# Patient Record
Sex: Female | Born: 1950 | Race: White | Hispanic: No | Smoking: Former smoker
Health system: Southern US, Community
[De-identification: ages and names within clinical notes are randomized; demographics above are authoritative.]

## PROBLEM LIST (undated history)

## (undated) DIAGNOSIS — E7800 Pure hypercholesterolemia, unspecified: Secondary | ICD-10-CM

## (undated) DIAGNOSIS — I499 Cardiac arrhythmia, unspecified: Secondary | ICD-10-CM

## (undated) DIAGNOSIS — C4491 Basal cell carcinoma of skin, unspecified: Secondary | ICD-10-CM

## (undated) DIAGNOSIS — I10 Essential (primary) hypertension: Secondary | ICD-10-CM

## (undated) DIAGNOSIS — D369 Benign neoplasm, unspecified site: Secondary | ICD-10-CM

## (undated) DIAGNOSIS — G5600 Carpal tunnel syndrome, unspecified upper limb: Secondary | ICD-10-CM

## (undated) DIAGNOSIS — Z9109 Other allergy status, other than to drugs and biological substances: Secondary | ICD-10-CM

## (undated) DIAGNOSIS — E039 Hypothyroidism, unspecified: Secondary | ICD-10-CM

## (undated) DIAGNOSIS — Z8619 Personal history of other infectious and parasitic diseases: Secondary | ICD-10-CM

## (undated) HISTORY — PX: MELANOMA EXCISION: SHX5266

## (undated) HISTORY — DX: Basal cell carcinoma of skin, unspecified: C44.91

## (undated) HISTORY — PX: OTHER SURGICAL HISTORY: SHX169

## (undated) HISTORY — DX: Cardiac arrhythmia, unspecified: I49.9

## (undated) HISTORY — DX: Benign neoplasm, unspecified site: D36.9

## (undated) HISTORY — PX: BASAL CELL CARCINOMA EXCISION: SHX1214

## (undated) HISTORY — PX: BREAST BIOPSY: SHX20

## (undated) HISTORY — DX: Carpal tunnel syndrome, unspecified upper limb: G56.00

## (undated) HISTORY — PX: BREAST CYST ASPIRATION: SHX578

## (undated) HISTORY — DX: Essential (primary) hypertension: I10

## (undated) HISTORY — DX: Personal history of other infectious and parasitic diseases: Z86.19

## (undated) HISTORY — DX: Hypothyroidism, unspecified: E03.9

## (undated) HISTORY — PX: POLYPECTOMY: SHX149

## (undated) HISTORY — DX: Pure hypercholesterolemia, unspecified: E78.00

## (undated) HISTORY — DX: Other allergy status, other than to drugs and biological substances: Z91.09

---

## 1980-08-15 HISTORY — PX: TUBAL LIGATION: SHX77

## 2003-08-16 DIAGNOSIS — D369 Benign neoplasm, unspecified site: Secondary | ICD-10-CM

## 2003-08-16 HISTORY — DX: Benign neoplasm, unspecified site: D36.9

## 2004-03-05 ENCOUNTER — Encounter: Payer: Self-pay | Admitting: Gastroenterology

## 2006-07-17 ENCOUNTER — Ambulatory Visit: Payer: Self-pay | Admitting: Gastroenterology

## 2006-07-28 ENCOUNTER — Ambulatory Visit: Payer: Self-pay | Admitting: Gastroenterology

## 2006-08-14 ENCOUNTER — Ambulatory Visit (HOSPITAL_COMMUNITY): Admission: RE | Admit: 2006-08-14 | Discharge: 2006-08-14 | Payer: Self-pay | Admitting: Gastroenterology

## 2006-08-18 ENCOUNTER — Ambulatory Visit (HOSPITAL_COMMUNITY): Admission: RE | Admit: 2006-08-18 | Discharge: 2006-08-18 | Payer: Self-pay | Admitting: Gastroenterology

## 2006-08-18 ENCOUNTER — Ambulatory Visit: Payer: Self-pay | Admitting: Gastroenterology

## 2006-08-18 LAB — CONVERTED CEMR LAB
AST: 18 units/L (ref 0–37)
CO2: 30 meq/L (ref 19–32)
Calcium: 9.4 mg/dL (ref 8.4–10.5)
Creatinine, Ser: 0.8 mg/dL (ref 0.4–1.2)
Glucose, Bld: 93 mg/dL (ref 70–99)
MCHC: 34.8 g/dL (ref 30.0–36.0)
Platelets: 328 10*3/uL (ref 150–400)
Potassium: 4.6 meq/L (ref 3.5–5.1)
RBC: 4.52 M/uL (ref 3.87–5.11)
Sed Rate: 10 mm/hr (ref 0–25)

## 2006-08-23 ENCOUNTER — Ambulatory Visit: Payer: Self-pay | Admitting: Gastroenterology

## 2006-08-24 ENCOUNTER — Ambulatory Visit: Payer: Self-pay | Admitting: Cardiology

## 2006-09-11 ENCOUNTER — Ambulatory Visit: Payer: Self-pay | Admitting: Gastroenterology

## 2006-10-16 ENCOUNTER — Ambulatory Visit: Payer: Self-pay | Admitting: Internal Medicine

## 2006-10-16 LAB — CONVERTED CEMR LAB
Cholesterol: 208 mg/dL (ref 0–200)
Direct LDL: 118.2 mg/dL
HDL: 68.4 mg/dL (ref >39.0)
TSH: 1 microintl units/mL (ref 0.35–5.50)
VLDL: 15 mg/dL (ref 0–40)

## 2007-01-04 ENCOUNTER — Ambulatory Visit: Payer: Self-pay | Admitting: Internal Medicine

## 2007-01-23 ENCOUNTER — Ambulatory Visit: Payer: Self-pay | Admitting: Internal Medicine

## 2007-10-18 ENCOUNTER — Encounter: Payer: Self-pay | Admitting: Internal Medicine

## 2008-08-18 ENCOUNTER — Ambulatory Visit: Payer: Self-pay | Admitting: General Surgery

## 2008-08-18 ENCOUNTER — Ambulatory Visit: Payer: Self-pay | Admitting: Cardiovascular Disease

## 2010-02-11 ENCOUNTER — Encounter (INDEPENDENT_AMBULATORY_CARE_PROVIDER_SITE_OTHER): Payer: Self-pay | Admitting: *Deleted

## 2010-03-23 ENCOUNTER — Ambulatory Visit: Payer: Self-pay | Admitting: Gastroenterology

## 2010-03-23 DIAGNOSIS — Z86010 Personal history of colonic polyps: Secondary | ICD-10-CM

## 2010-03-23 DIAGNOSIS — R079 Chest pain, unspecified: Secondary | ICD-10-CM | POA: Insufficient documentation

## 2010-03-23 LAB — CONVERTED CEMR LAB
ALT: 30 units/L (ref 0–35)
AST: 21 units/L (ref 0–37)
Albumin: 4.8 g/dL (ref 3.5–5.2)
Alkaline Phosphatase: 69 units/L (ref 39–117)
Basophils Absolute: 0 10*3/uL (ref 0.0–0.1)
Bilirubin, Direct: 0.1 mg/dL (ref 0.0–0.3)
HCT: 38.8 % (ref 36.0–46.0)
Hemoglobin: 13.7 g/dL (ref 12.0–15.0)
Lipase: 28 units/L (ref 11.0–59.0)
Lymphocytes Relative: 24 % (ref 12.0–46.0)
MCHC: 35.2 g/dL (ref 30.0–36.0)
MCV: 88.4 fL (ref 78.0–100.0)
Monocytes Relative: 5.6 % (ref 3.0–12.0)
Neutrophils Relative %: 67.2 % (ref 43.0–77.0)
Platelets: 262 10*3/uL (ref 150.0–400.0)
RBC: 4.39 M/uL (ref 3.87–5.11)
Total Protein: 7 g/dL (ref 6.0–8.3)

## 2010-03-26 ENCOUNTER — Ambulatory Visit: Payer: Self-pay | Admitting: Cardiovascular Disease

## 2010-04-05 ENCOUNTER — Ambulatory Visit (HOSPITAL_COMMUNITY): Admission: RE | Admit: 2010-04-05 | Discharge: 2010-04-05 | Payer: Self-pay | Admitting: Gastroenterology

## 2010-04-12 ENCOUNTER — Telehealth: Payer: Self-pay | Admitting: Gastroenterology

## 2010-09-05 ENCOUNTER — Encounter: Payer: Self-pay | Admitting: Gastroenterology

## 2010-09-14 NOTE — Assessment & Plan Note (Signed)
Summary: acid reflux//discuss colon--ch.   History of Present Illness Visit Type: Initial Visit Primary GI MD: Melvia Heaps MD [facility] Primary Provider: Oswaldo Done, MD Chief Complaint: acid reflux/discuss colonoscopy History of Present Illness:   [name] [name] is a pleasant 60 year old white female referred at the request of Dr. Katrinka Blazing for evaluation of chest discomfort.  She complains of burning chest pain that radiates to her back and occasionally to her neck, shoulders and forearms.  Pain may occur with mild activity but is not necessarily related to eating.  At times it has been moderately severe.  She has taken antacids with partial relief.  She rarely has dysphagia to solids.  She may have several episodes a month.  Endoscopy in 2007 for dyspepsia demonstrated esophagitis.  The patient has a history of adenomatous polyps.   GI Review of Systems    Reports acid reflux, belching, chest pain, and  nausea.      Denies abdominal pain, bloating, dysphagia with liquids, dysphagia with solids, heartburn, loss of appetite, vomiting, vomiting blood, weight loss, and  weight gain.        Denies anal fissure, black tarry stools, change in bowel habit, constipation, diarrhea, diverticulosis, fecal incontinence, heme positive stool, hemorrhoids, irritable bowel syndrome, jaundice, light color stool, liver problems, rectal bleeding, and  rectal pain. Preventive Screening-Counseling & Management  Alcohol-Tobacco     Smoking Status: quit      Drug Use:  no.      Current Medications (verified): 1)  Avapro 300 Mg Tabs (Irbesartan) .... Take 12  Tablet By Mouth Once A Day 2)  Synthroid 100 Mcg Tabs (Levothyroxine Sodium) .... Take 1 Tablet By Mouth Once A Day 3)  Lunesta 3 Mg  Tabs (Eszopiclone) .... 1/2 Tablet By Mouth At Bedtime As Needed 4)  Aspirin Low Dose 81 Mg  Tabs (Aspirin) .... Take 1 Tablet By Mouth Once A Day 5)  Progesteron 100 Mg (Compounded) .... Take 25 Days Out of A Month 6)   Os-Cal 500 + D 500-500 Mg-Unit Tabs (Calcium Carbonate-Vitamin D) .Marland Kitchen.. 1 By Mouth Once Daily 7)  Multivitamins  Tabs (Multiple Vitamin) .Marland Kitchen.. 1 By Mouth Once Daily 8)  Cal/mag Citrate 250-125 Mg Tabs (Calcium-Magnesium) .Marland Kitchen.. 1 By Mouth Once Daily 9)  Red Yeast Rice Extract 600 Mg Caps (Red Yeast Rice Extract) .Marland Kitchen.. 1 By Mouth Once Daily 10)  Omega 3 340 Mg Cpdr (Omega-3 Fatty Acids) .Marland Kitchen.. 1 By Mouth Once Daily  Allergies (verified): 1)  ! Codeine  Past History:  Past Medical History: Reviewed history from 03/18/2010 and no changes required. Adenomatous polyp 2005 Hypertension Hyperthyroidism  Past Surgical History: Tubal Ligation CTS Release Rt. Wrist Lipoma removal  Rt. Shoulder  Family History: Reviewed history and no changes required. Family History of Uterine Cancer:mother Family History of Diabetes: MGM Family History of Heart Disease: father No FH of Colon Cancer:  Social History: Reviewed history and no changes required. 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  Alcohol Use - yes  1 per day Daily Caffeine Use   2-3 per day Illicit Drug Use - no Smoking Status:  quit Drug Use:  no  Review of Systems  The patient denies allergy/sinus, anemia, anxiety-new, arthritis/joint pain, back pain, blood in urine, breast changes/lumps, change in vision, confusion, cough, coughing up blood, depression-new, fainting, fatigue, fever, headaches-new, hearing problems, heart murmur, heart rhythm changes, itching, menstrual pain, muscle pains/cramps, night sweats, nosebleeds, pregnancy symptoms, shortness of breath, skin rash, sleeping problems, sore  throat, swelling of feet/legs, swollen lymph glands, thirst - excessive , urination - excessive , urination changes/pain, urine leakage, vision changes, and voice change.    Vital Signs:  Patient profile:   60 year old female Height:      61.5 inches Weight:      151.38 pounds BMI:     28.24 Pulse rate:   68 /  minute Pulse rhythm:   regular BP sitting:   130 / 82  (left arm)  Vitals Entered By: Milford Cage NCMA (March 23, 2010 4:16 PM)  Physical Exam  Additional Exam:  On physical exam she is a well-developed well-nourished female  Physical Exam: General:   WDWN HEENT:   anicteric.  No pharyngeal abnormalities Neck:   No masses, thyroidmegaly Nodes:   No cervical, axillary, inguinal adenopathy Chest:    Clear to auscultation Cardiac:   No murmurs, gallops, rubs Abdomen:   BS active.  No abd masses, organomegaly; there is mild tenderness to palpation in the right upper quadrant Rectal:   Deferred Extremities:   No cyanosis, clubbing, edema Skeletal:   No deformities Neuro:   Alert, oriented x3.  No focal abnormalities     Impression & Recommendations:  Problem # 1:  CHEST PAIN UNSPECIFIED (ICD-786.50) Chest pain certainly could be due to reflux esophagitis and/or esophageal spasm.  There are elements of her chest pain that raised the question of angina and, less likely, cholecystitis.  Recommendations #1 trial of DEXILANT 60 mg b.i.d. #2 abdominal ultrasound #3 refer for cardiac evaluation to rule out cardiac ischemia as a cause for chest pain Orders: TLB-CBC Platelet - w/Differential (85025-CBCD) TLB-Amylase (82150-AMYL) TLB-Lipase (83690-LIPASE) TLB-Hepatic/Liver Function Pnl (80076-HEPATIC) Cardiology Referral (Cardiology) Ultrasound Abdomen (UAS)  Problem # 2:  PERSONAL HISTORY OF COLONIC POLYPS (ICD-V12.72) Last colonoscopy 2005.  Plan followup colonoscopy.  Risks, alternatives, and complications of the procedure, including bleeding, perforation, and possible need for surgery, were explained to the patient.  Patient's questions were answered.  Patient Instructions: 1)  Copy sent to : Oswaldo Done, MD 2)  You will go to the basement for labs today 3)  Your cardiology appointment is in [place] on 03/26/2010 at 2:45 pm 4)  [address] [place]  phone # [phone] 5)  We are also scheduling you an abdominal Ultrasound 6)  Colonoscopy and Flexible Sigmoidoscopy brochure given.  7)  Conscious Sedation brochure given.  8)  The medication list was reviewed and reconciled.  All changed / newly prescribed medications were explained.  A complete medication list was provided to the patient / caregiver.

## 2010-09-14 NOTE — Procedures (Signed)
Summary: EGD   EGD  Procedure date:  07/28/2006  Findings:      Findings: Esophagitis  Location: [facility]   Patient Name: [name], [name] MRN:  Procedure Procedures: Panendoscopy (EGD) CPT: 43235.    with esophageal dilation. CPT: G9296129.  Personnel: Endoscopist: Barbette Hair. Arlyce Dice, MD.  Indications Symptoms: Dysphagia. Dyspepsia,  History  Current Medications: Patient is not currently taking Coumadin.  Comments: Patient history reviewed and updated, pre-procedure physical performed prior to initiation of sedation? Pre-Exam Physical: Performed Jul 28, 2006  Cardio-pulmonary exam, HEENT exam, Abdominal exam, Mental status exam WNL.  Comments: Patient history reviewed and updated, pre-procedure physical performed prior to initiation of sedation?yes Exam Exam Info: Maximum depth of insertion Duodenum, intended Duodenum. Vocal cords visualized. Gastric retroflexion performed. ASA Classification: I. Tolerance: excellent.  Sedation Meds: Robinul 0.2 given IV. Fentanyl 50 mcg. given IV. Versed 6 mg. given IV. Cetacaine Spray 2 sprays given aerosolized. Mylicon 1 cc  Monitoring: BP and pulse monitoring done. Oximetry used. Supplemental O2 given at 2 Liters.  Findings - Normal: Fundus to Duodenal 2nd Portion.  - Dilation: Fundus. for dysphagia without stricture. Maloney dilator used, Diameter: 17 mm, Minimal Resistance, No Heme present on extraction. Outcome: successful.  - ESOPHAGEAL INFLAMMATION: Proximal margin 39 cm from mouth,  distal margin 40 cm. Length of inflammation: 1 cm. Edema present. [place] Classification: Grade A. ICD9: Esophagitis: 530.10.   Assessment Abnormal examination, see findings above.  Diagnoses: 530.10: Esophagitis.   Events  Unplanned Intervention: No unplanned interventions were required.  Unplanned Events: There were no complications. Plans Medication(s): Continue current medications.  Disposition: After  procedure patient sent to recovery. After recovery patient sent home.  Scheduling: Office Visit, to [employer]. Arlyce Dice, MD, around Aug 28, 2006.    This report was created from the original endoscopy report, which was reviewed and signed by the above listed endoscopist.

## 2010-09-14 NOTE — Assessment & Plan Note (Signed)
Summary: np6/ chestpain / pt has bcbs.gd   Visit Type:  Initial Consult Primary Couper [place]aj:  Oswaldo Done, MD  CC:  c/o chest and abdominal pain.Marland Kitchen  History of Present Illness: [name] [name] is a very pleasant 60 year old woman who is a patient of Dr. Katrinka Blazing with a history of GERD, hypertension, recent long history of chest discomfort over the past several months. She presents for evaluation of her chest pain.  She states that she has chest discomfort that she would describe as a burning, that sometimes radiates to her neck and shoulders. It has been going on for months, lasting most of the day. She is able to exercise with no exacerbation of her discomfort. It is constant and bothersome. She reports having 2 to the emergency room in the past, I have given her a nitroglycerin there was some improvement of her pain. She has not tried any other cardiac medications. she has been on Dexilant for the past several days.uncertain if it is related to eating, with either improvement or exacerbation of her symptoms.  In the beginning of the year, she had significant headaches but he seemed to have improved. She has had an EGD several years ago for dyspepsia that demonstrated esophagitis.  EKG shows normal sinus rhythm with rate 66 beats per minute, no significant ST or T wave changes.  Current Medications (verified): 1)  Avapro 300 Mg Tabs (Irbesartan) .... Take 12  Tablet By Mouth Once A Day 2)  Synthroid 100 Mcg Tabs (Levothyroxine Sodium) .... Take 1 Tablet By Mouth Once A Day 3)  Lunesta 3 Mg  Tabs (Eszopiclone) .... 1/2 Tablet By Mouth At Bedtime As Needed 4)  Aspirin Low Dose 81 Mg  Tabs (Aspirin) .... Take 1 Tablet By Mouth Once A Day 5)  Progesteron 100 Mg (Compounded) .... Take 25 Days Out of A Month 6)  Os-Cal 500 + D 500-500 Mg-Unit Tabs (Calcium Carbonate-Vitamin D) .Marland Kitchen.. 1 By Mouth Once Daily 7)  Multivitamins  Tabs (Multiple Vitamin) .Marland Kitchen.. 1 By Mouth Once Daily 8)  Cal/mag Citrate 250-125 Mg  Tabs (Calcium-Magnesium) .Marland Kitchen.. 1 By Mouth Once Daily 9)  Red Yeast Rice Extract 600 Mg Caps (Red Yeast Rice Extract) .Marland Kitchen.. 1 By Mouth Once Daily 10)  Omega 3 340 Mg Cpdr (Omega-3 Fatty Acids) .Marland Kitchen.. 1 By Mouth Once Daily  Allergies (verified): 1)  ! Codeine  Past History:  Past Medical History: Last updated: 03/18/2010 Adenomatous polyp 2005 Hypertension Hyperthyroidism  Past Surgical History: Last updated: 03/23/2010 Tubal Ligation CTS Release Rt. Wrist Lipoma removal  Rt. 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  Alcohol Use - yes  1 per day Daily Caffeine Use   2-3 per day Illicit Drug Use - no  Risk Factors: Smoking Status: quit (03/23/2010)  Review of Systems       The patient complains of chest pain.  The patient denies fever, weight loss, weight gain, vision loss, decreased hearing, hoarseness, syncope, dyspnea on exertion, peripheral edema, prolonged cough, abdominal pain, incontinence, muscle weakness, depression, and enlarged lymph nodes.    Vital Signs:  Patient profile:   60 year old female Height:      61.5 inches Weight:      152 pounds BMI:     28.36 Pulse rate:   69 /  minute BP sitting:   144 / 83  (left arm) Cuff size:   regular  Vitals Entered By: Bishop Dublin, CMA (March 26, 2010 2:46 PM)  Physical Exam  General:  Well developed, well nourished, in no acute distress. Head:  normocephalic and atraumatic Neck:  Neck supple, no JVD. No masses, thyromegaly or abnormal cervical nodes. Lungs:  Clear bilaterally to auscultation and percussion. Heart:  Non-displaced PMI, chest non-tender; regular rate and rhythm, S1, S2 without murmurs, rubs or gallops. Carotid upstroke normal, no bruit. Pedals normal  pulses. No edema, no varicosities. Abdomen:  Bowel sounds positive; abdomen soft and non-tender without masses Msk:  Back normal, normal gait. Muscle strength and tone normal. Pulses:  pulses normal in all 4 extremities Extremities:  No clubbing or cyanosis. Neurologic:  Alert and oriented x 3. Skin:  Intact without lesions or rashes. Psych:  Normal affect.   Impression & Recommendations:  Problem # 1:  CHEST PAIN UNSPECIFIED (ICD-786.50) symptoms are concerning for esophageal spasm. Unable to definitively rule out other etiologies such as hiatal hernia, coronary spasm, dyspepsia.  We have given her a prescription for amlodipine and will try calcium channel blockers for her suspected spasm. She will try amlodipine at 5 mg, possibly titrating to 10 mg if her blood pressure tolerates.  Alternatively, she could try isosorbide mononitrate starting at 15 mg titrating to 30 mg if her blood pressure tolerates. For severe episodes of pain, she could try sublingual nitroglycerin. We will hold off on any cardiac testing as she has no symptoms with exertion, her symptoms are somewhat atypical as it lasts throughout the entire day and are mild.  Her updated medication list for this problem includes:    Amlodipine Besylate 10 Mg Tabs (Amlodipine besylate) .Marland Kitchen... Take one tablet by mouth daily    Aspirin Low Dose 81 Mg Tabs (Aspirin) .Marland Kitchen... Take 1 tablet by mouth once a day  Patient Instructions: 1)  Your physician recommends that you schedule a follow-up appointment in: as needed  2)  Your physician has recommended you make the following change in your medication: STOP avapro START amolodipine Prescriptions: AMLODIPINE BESYLATE 10 MG TABS (AMLODIPINE BESYLATE) Take one tablet by mouth daily  #30 x 6   Entered by:   Benedict Needy, RN   Authorized by:   Dossie Arbour MD   Signed by:   Benedict Needy, RN on 03/26/2010   Method used:   Electronically to        [facility]* (retail)       [address]       [place], [place]  [address]       Ph: [phone]       Fax: [phone]   RxID:   [id number]

## 2010-09-14 NOTE — Letter (Signed)
Summary: New Patient letter  [facility]  [address] [place], [place] [address]   Phone: [phone]  Fax: [phone]       02/11/2010 MRN: [id number]  [name] [name] [address] [place], [place]  [address]  Dear [name] [name],  Welcome to the Gastroenterology Division at [facility].    You are scheduled to see Dr.  Arlyce Dice on 03-23-10 at 3:45p.m. on the 3rd floor at [facility], [name]  We ask that you try to arrive at our office 15 minutes prior to your appointment time to allow for check-in.  We would like you to complete the enclosed self-administered evaluation form prior to your visit and bring it with you on the day of your appointment.  We will review it with you.  Also, please bring a complete list of all your medications or, if you prefer, bring the medication bottles and we will list them.  Please bring your insurance card so that we may make a copy of it.  If your insurance requires a referral to see a specialist, please bring your referral form from your primary care physician.  Co-payments are due at the time of your visit and may be paid by cash, check or credit card.     Your office visit will consist of a consult with your physician (includes a physical exam), any laboratory testing he/she may order, scheduling of any necessary diagnostic testing (e.g. x-ray, ultrasound, CT-scan), and scheduling of a procedure (e.g. Endoscopy, Colonoscopy) if required.  Please allow enough time on your schedule to allow for any/all of these possibilities.    If you cannot keep your appointment, please call [phone] to cancel or reschedule prior to your appointment date.  This allows [place] the opportunity to schedule an appointment for another patient in need of care.  If you do not cancel or reschedule by 5 p.m. the business day prior to your appointment date, you will be charged a $50.00 late cancellation/no-show fee.    Thank you for choosing [facility] for your medical needs.  We appreciate the opportunity to care for you.  Please visit [place] at our website  to learn more about our practice.                     Sincerely,                                                             The Gastroenterology Division

## 2010-09-14 NOTE — Progress Notes (Signed)
Summary: Ultrasound Results  Phone Note Call from Patient Call back at [phone] or cell [phone]   Caller: Patient Call For: Dr Arlyce Dice Reason for Call: Talk to Nurse Summary of Call: Patient would like u/s results Initial call taken by: Tawni Levy,  April 12, 2010 11:25 AM  Follow-up for Phone Call        Pt. advised I will call her with Ultrasound results after Dr.Kaplan returns, reviews and advises. Pt. will continue the Dexilant until further instructions. She does feel her symptoms have improved on the Dexilant.  Follow-up by: Laureen Ochs LPN,  April 12, 2010 12:51 PM  Additional Follow-up for Phone Call Additional follow up Details #1::        Ultra negative for significant findings. continue dexilant Additional Follow-up by: Louis Meckel MD,  April 20, 2010 1:23 PM    Additional Follow-up for Phone Call Additional follow up Details #2::    Pt. ntfd. of [place] results and will continue Dexilant. Follow-up by: Teryl Lucy RN,  April 20, 2010 4:39 PM

## 2010-09-14 NOTE — Letter (Signed)
Summary: PHI  PHI   Imported By: Harlon Flor 03/29/2010 10:43:51  _____________________________________________________________________  External Attachment:    Type:   Image     Comment:   External Document

## 2010-12-31 NOTE — Assessment & Plan Note (Signed)
[facility]                           PRIMARY CARE OFFICE NOTE   ASYRIA, KOLANDER                        MRN:          [id number]  DATE:10/16/2006                            DOB:          1951/02/15    CHIEF COMPLAINT:  New patient to practice.   HISTORY OF PRESENT ILLNESS:  The patient is a 60 year old here to  establish primary care. She has not had a primary care physician in the  past and was followed by her OB/GYN Dr.  Barnabas Lister. She has seen Dr.  Arlyce Dice of [facility] regarding early satiety, underwent workup which  included a CAT scan which was unremarkable, EGD which showed  esophagitis. The patient was tried on a proton pump inhibitor which did  not significant change her symptomatology and also tried Reglan, which  actually caused constipation. She also had a gastric emptying study  which was unremarkable. The patient did have some complaints of issues  with weight loss. However, her weight has been stable, actually gained 2  pounds since her last visit with Dr.  Arlyce Dice on September 11, 2006.   Her other chronic medical issues include hypertension and  hypothyroidism. There is also a report of elevated cholesterol readings.  This was uncovered during the time hypothyroidism was discovered.  However, followup lipids have not been performed since her thyroid has  been regulated.   She denies any history of coronary artery disease, has never had a  stroke, no history of peripheral vascular disease.   PAST MEDICAL HISTORY SUMMARY:  1. Hypertension.  2. Hypothyroidism.  3. Status post carpal tunnel surgery on her right hand.  4. History of basal cell carcinoma removed from her back.  5. Chronic insomnia.  6. History of migraine headache.   CURRENT MEDICATIONS:  1. Avapro 150 mg once a day.  2. Synthroid 100 mcg once a day.  3. Multivitamin 1 a day.  4. Coenzyme Q 1 a day.  5. Aspirin 81 mg once a day.  6. Lunesta 3 mg 1/2 q.h.s.  p.r.n.   ALLERGIES TO MEDICATIONS:  None known.   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   FAMILY HISTORY:  Mother deceased at age 45 secondary to cerebral  hemorrhage. Father died at age 62 secondary to metastatic lung cancer.  He was a long time smoker. She has 2 brothers, one deceased secondary to  a drowning accident.   HABITS:  Red wine with dinner, averages 4 to 5 drinks per week. Remote  history of tobacco abuse, quit in 1981. She was a 1 pack per week smoker  times 6 to 7 years.   PREVENTATIVE CARE HISTORY:  Her last Pap was in June 2007. Her last  colonoscopy was in 2005.   REVIEW OF SYSTEMS:  Denies any HEENT symptoms and no history of chest  pain with exertion or dyspnea on exertion. GI symptoms as noted above.  No dysuria, frequency, urgency, and all other systems negative.  PHYSICAL EXAMINATION:  VITALS:  Height is 63 inches, weight is 149  pounds, temperature 97.2, pulse is 60, blood pressure is 131/76 in the  left arm in a seated position.  IN GENERAL:  The patient is a pleasant well-developed, well-nourished 30-  year-old white female in no apparent distress.  HEENT:  Normocephalic, atraumatic. Pupils were equal and reactive to  light bilaterally. Extraocular muscles were intact. The patient was  anicteric. Conjunctivae was within normal limits.  EXTERNAL AUDITORY CANALS:  Reveals cerumen impaction on the right side.  The left side was clear.  NECK:  Supple. No adenopathy, carotid bruit, or thyromegaly.  CHEST EXAM:  Normal respiratory effort. Chest was clear to auscultation  bilaterally. No rales, rhonchi, or wheezing.  CARDIOVASCULAR:  Regular rate and rhythm,  no significant murmurs, rubs,  or gallops appreciated.  ABDOMEN:  Soft, nontender, positive bowel sounds. No organomegaly.  MUSCULOSKELETAL EXAM:  No clubbing, cyanosis, or edema. The  patient has  bounding pedis and dorsalis pulses.  SKIN:  Warm and dry.  NEUROLOGIC:  Cranial nerves II through XII were grossly intact. She was  nonfocal.   IMPRESSION/RECOMMENDATIONS:  1. Early satiety, unclear etiology.  2. Hypertension, stable.  3. Hypothyroidism.  4. History of migraine headache.  5. History of basal cell skin cancer.  6. Health maintenance.   RECOMMENDATIONS:  Encouraged the patient to maintain a blood pressure  log at home. She did have a reading of 150/70 with a manual cuff for me  today. This may be related to white coat hypertension.   She does have a history of elevated cholesterol readings and lipid panel  will be performed today as well as followup TSH.   In terms of her mild nausea, this may be secondary to [facility]. On  followup visit we will discuss a trial of Ambien to see if it changes  her GI symptomatology.   Follow up time is approximately 4 weeks.     Barbette Hair. Artist Pais, DO  Electronically Signed    RDY/MedQ  DD: 10/16/2006  DT: 10/16/2006  Job #: [id number]

## 2010-12-31 NOTE — Assessment & Plan Note (Signed)
[facility]                         GASTROENTEROLOGY OFFICE NOTE   NAME:[name] [name], [name] [name]                        MRN:          [id number]  DATE:08/23/2006                            DOB:          Feb 03, 1951    PROBLEM:  Early satiety and weight-loss.   HPI:  [name] [name] has returned for scheduled GI followup.  She  continues to complain of postprandial fullness with early satiety.  She  has lost seven pounds since her last visit.  Should she eat too much,  she has mild nausea.  She is without pain.  Abdominal ultrasound and  gastric emptying scan were normal.  Upper endoscopy on July 28, 2006, demonstrated a very mild grade 1 esophagitis.  Symptoms have not  improved with Zegerid.   LAB WORK:  Including CBC and CMET, was normal.   ON EXAM:  Pulse 78, blood pressure 122/74, weight 151.  Abdomen is  without masses, tenderness or organomegaly.  There is no succussion  splash.   IMPRESSION:  Persistent early satiety with weight-loss.  Etiology is not  apparent.  She has had no changes in her medications.  There is nothing  on examination.  There are no abnormalities on her abdominal exam and  she does not appear to have gastroparesis.   RECOMMENDATIONS:  1. Trial of Reglan 10 mg one half hour a.c. and h.s.  2. CT of the abdomen and pelvis to rule out other intra-abdominal      pathology.     Barbette Hair. Arlyce Dice, MD,FACG  Electronically Signed    RDK/MedQ  DD: 08/23/2006  DT: 08/23/2006  Job #: [id number]   cc:   Annett Fabian

## 2010-12-31 NOTE — Assessment & Plan Note (Signed)
[facility]                         GASTROENTEROLOGY OFFICE NOTE   NAME:[name], [name]                        MRN:          [id number]  DATE:09/11/2006                            DOB:          [birth year]    PROBLEM:  Early satiety and weight loss.  [name] [name] has returned for  a scheduled GI followup.  CT of the abdomen and pelvis was entirely  normal.  Reglan did not help her symptoms.  She does note slight  improvement, however, characterized by less satiety.  She is eating  healthier foods and having more frequent smaller meals, and overall is  feeling well.  GI symptoms are much less prominent.  Weight, she has  dropped another 2-and-a-half pounds since her visit from January 9.   EXAM:  Pulse 78, blood pressure 121/64, weight 147.   IMPRESSION:  Early satiety and weight loss, etiology for this is  unclear, though her symptoms seem to be subsiding.  She looks and feels  well at this time.   RECOMMENDATIONS:  Observation.  I have asked [name] to return in  about 6 to 8 weeks unless she should develop more specific symptoms.     Barbette Hair. Arlyce Dice, MD,FACG  Electronically Signed    RDK/MedQ  DD: 09/11/2006  DT: 09/11/2006  Job #: [id number]   cc:   Annett Fabian

## 2010-12-31 NOTE — Letter (Signed)
July 17, 2006    [name] Fabian, MD  [address]  [place], [place] [address]   RE:  [name], [name]  MRN:  [id number]  /  DOB:  Jan 27, 1951   Dear Dr. Jonny Ruiz:   Upon your kind referral, I had the pleasure of evaluating your patient  and I am pleased to offer my findings.  I saw [name] [name] in the office  today.  Enclosed is a copy of my progress note that details my findings  and recommendations.   Thank you for the opportunity to participate in your patient's care.    Sincerely,      Barbette Hair. Arlyce Dice, MD,FACG  Electronically Signed    RDK/MedQ  DD: 07/17/2006  DT: 07/17/2006  Job #: [id number]

## 2010-12-31 NOTE — Assessment & Plan Note (Signed)
[facility]                         GASTROENTEROLOGY OFFICE NOTE   NAME:[name], [name]                          MRN:          [id number]  DATE:07/17/2006                            DOB:          Mar 05, 1951    REFERRING PHYSICIAN:  Annett Fabian, MD   PROBLEM:  Digestion problem.   [name] [name] is a pleasant 60 year old white female referred through the  courtesy of Dr. Barnabas Lister for evaluation. For the past couple of  months, she has been complaining of bloating, post prandial fullness and  early satiety. She denies pyrosis, per say. She has had no nausea. She  does have occasional dysphagia to solids and, more rarely, liquids. She  is on no regular gastric irritants including nonsteroidals. Weight has  been stable. She was given a medicine whose name she does not recall  when her symptoms started that ameliorated her symptoms, but they have  not returned. There has been no change in bowel habits. She has a  history of adenomatous colon polyps removed in 2005.   PAST MEDICAL HISTORY:  Pertinent for hypertension and thyroid disease.  She has arthritis and suffers from chronic headaches. She is status post  tubal ligation and carpal tunnel repair.   FAMILY HISTORY:  Pertinent for mother with uterine cancer.   MEDICATIONS:  Avapro, Synthroid and baby aspirin. She has no allergies.   She does not smoke. She has 3-4 glasses of wine a week. 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   REVIEW OF SYSTEMS:  Positive for joint pain.   PHYSICAL EXAMINATION:  VITAL SIGNS:  Pulse 72, blood pressure 120/80,  weight 157.  HEENT:  EOMI. PERRLA. Sclerae are anicteric.  Conjunctivae are pink.  NECK:  Supple without thyromegaly, adenopathy or carotid bruits.  CHEST:  Clear to auscultation and percussion without adventitious  sounds.  CARDIAC:  Regular rhythm; normal S1 S2.  There are no murmurs, gallops  or rubs.  ABDOMEN:  Bowel sounds  are normoactive.  Abdomen is soft, non-tender and  non-distended.  There are no abdominal masses, tenderness, splenic  enlargement or hepatomegaly.  EXTREMITIES:  Full range of motion.  No cyanosis, clubbing or edema.  RECTAL:  Deferred.   IMPRESSION:  1. Probably gastroesophageal reflux disease. Dysphagia raises the      question of an esophageal stricture.  2. History of colon polyps.   RECOMMENDATION:  1. Empiric trial of Protonix 40 mg daily.  2. Upper endoscopy with dilatation as indicated.  3. Followup colonoscopy in 2010.     Barbette Hair. Arlyce Dice, MD,FACG  Electronically Signed    RDK/MedQ  DD: 07/17/2006  DT: 07/17/2006  Job #: [id number]   cc:   Annett Fabian, MD

## 2010-12-31 NOTE — Letter (Signed)
July 17, 2006    [name] [name]  [address] [place] [address]   RE:  SAHASRA, BELUE  MRN:  [id number]  /  DOB:  06-10-1951   Dear [name] [name]:   It is my pleasure to have treated you recently as a new patient in my  office.  I appreciate your confidence and the opportunity to participate  in your care.   Since I do have a busy inpatient endoscopy schedule and office schedule,  my office hours vary weekly.  I am, however, available for emergency  calls every day through my office.  If I cannot promptly meet an urgent  office appointment, another one of our gastroenterologists will be able  to assist you.   My well-trained staff are prepared to help you at all times.  For  emergencies after office hours, a physician from our gastroenterology  section is always available through my 24-hour answering service.   While you are under my care, I encourage discussion of your questions  and concerns, and I will be happy to return your calls as soon as I am  available.   Once again, I welcome you as a new patient and I look forward to a happy  and healthy relationship.    Sincerely,      Barbette Hair. Arlyce Dice, MD,FACG  Electronically Signed    RDK/MedQ  DD: 07/17/2006  DT: 07/17/2006  Job #: [id number]

## 2011-03-03 ENCOUNTER — Encounter: Payer: Self-pay | Admitting: Cardiovascular Disease

## 2011-05-20 LAB — HM PAP SMEAR: HM PAP: NEGATIVE

## 2012-02-28 ENCOUNTER — Encounter: Payer: Self-pay | Admitting: Gastroenterology

## 2012-04-13 ENCOUNTER — Ambulatory Visit: Payer: Self-pay | Admitting: Gastroenterology

## 2012-06-08 ENCOUNTER — Encounter: Payer: Self-pay | Admitting: Gastroenterology

## 2012-06-08 ENCOUNTER — Ambulatory Visit (INDEPENDENT_AMBULATORY_CARE_PROVIDER_SITE_OTHER): Payer: BC Managed Care – PPO | Admitting: Gastroenterology

## 2012-06-08 VITALS — BP 138/80 | HR 72 | Ht 61.5 in | Wt 150.0 lb

## 2012-06-08 DIAGNOSIS — Z86010 Personal history of colonic polyps: Secondary | ICD-10-CM

## 2012-06-08 MED ORDER — NA SULFATE-K SULFATE-MG SULF 17.5-3.13-1.6 GM/177ML PO SOLN: 1.0000 | Freq: Once | ORAL | Status: DC

## 2012-06-08 NOTE — Assessment & Plan Note (Signed)
Plan followup colonoscopy 

## 2012-06-08 NOTE — Progress Notes (Signed)
History of Present Illness: Pleasant 61 year old white female with history of adenomatous colon polyps here to set up colonoscopy.  An adenomatous polyp was removed approximately 7-8 years ago. Records are not available. In 2007 a distal esophageal stricture was dilated. Her only GI complaint is occasional abdominal distention, particularly when she eats fatty or fried foods. She's had no change in bowel habits, pyrosis, melena or hematochezia.    Past Medical History  Diagnosis Date  . Adenomatous polyp 2005  . HTN (hypertension)   . Hyperthyroidism    Past Surgical History  Procedure Date  . Tubal ligation   . Cts releast rt wrist   . Lipoma removal     Rt shoulder    family history includes Diabetes in her maternal grandmother; Heart disease in her father; and Uterine cancer in her mother.  There is no history of Colon cancer. Current Outpatient Prescriptions  Medication Sig Dispense Refill  . aspirin 81 MG tablet Take 81 mg by mouth daily.        . Calcium Carbonate-Vitamin D (OS-CAL 500 + D PO) Take 1 tablet by mouth daily.        . Calcium-Magnesium (CAL/MAG CITRATE) 250-125 MG TABS Take 1 tablet by mouth daily.        . cholecalciferol (VITAMIN D) 1000 UNITS tablet Take 1,000 Units by mouth daily.      . Coenzyme Q10-Red Yeast Rice 25-600 MG CAPS Take 1 capsule by mouth daily.      . Eszopiclone (ESZOPICLONE) 3 MG TABS Take 1.5 mg by mouth at bedtime as needed. Take immediately before bedtime       . levothyroxine (SYNTHROID, LEVOTHROID) 100 MCG tablet Take 100 mcg by mouth daily.        . Multiple Vitamin (MULTIVITAMIN) tablet Take 1 tablet by mouth daily.        . OMEGA 3 340 MG CPDR Take 1 capsule by mouth daily.        . progesterone (PROMETRIUM) 100 MG capsule Take 100 mg by mouth. Take 25 days out of a month        Allergies as of 06/08/2012 - Review Complete 06/08/2012  Allergen Reaction Noted  . Codeine      reports that she has quit smoking. She has never used  smokeless tobacco. She reports that she drinks alcohol. She reports that she does not use illicit drugs.     Review of Systems: Pertinent positive and negative review of systems were noted in the above HPI section. All other review of systems were otherwise negative.  Vital signs were reviewed in today's medical record Physical Exam: General: Well developed , well nourished, no acute distress Head: Normocephalic and atraumatic Eyes:  sclerae anicteric, EOMI Ears: Normal auditory acuity Mouth: No deformity or lesions Neck: Supple, no masses or thyromegaly Lungs: Clear throughout to auscultation Heart: Regular rate and rhythm; no murmurs, rubs or bruits Abdomen: Soft, non tender and non distended. No masses, hepatosplenomegaly or hernias noted. Normal Bowel sounds Rectal:deferred Musculoskeletal: Symmetrical with no gross deformities  Skin: No lesions on visible extremities Pulses:  Normal pulses noted Extremities: No clubbing, cyanosis, edema or deformities noted Neurological: Alert oriented x 4, grossly nonfocal Cervical Nodes:  No significant cervical adenopathy Inguinal Nodes: No significant inguinal adenopathy Psychological:  Alert and cooperative. Normal mood and affect

## 2012-06-08 NOTE — Patient Instructions (Addendum)
Colonoscopy A colonoscopy is an exam to evaluate your entire colon. In this exam, your colon is cleansed. A long fiberoptic tube is inserted through your rectum and into your colon. The fiberoptic scope (endoscope) is a long bundle of enclosed and very flexible fibers. These fibers transmit light to the area examined and send images from that area to your caregiver. Discomfort is usually minimal. You may be given a drug to help you sleep (sedative) during or prior to the procedure. This exam helps to detect lumps (tumors), polyps, inflammation, and areas of bleeding. Your caregiver may also take a small piece of tissue (biopsy) that will be examined under a microscope. LET YOUR CAREGIVER KNOW ABOUT:   Allergies to food or medicine.  Medicines taken, including vitamins, herbs, eyedrops, over-the-counter medicines, and creams.  Use of steroids (by mouth or creams).  Previous problems with anesthetics or numbing medicines.  History of bleeding problems or blood clots.  Previous surgery.  Other health problems, including diabetes and kidney problems.  Possibility of pregnancy, if this applies. BEFORE THE PROCEDURE   A clear liquid diet may be required for 2 days before the exam.  Ask your caregiver about changing or stopping your regular medications.  Liquid injections (enemas) or laxatives may be required.  A large amount of electrolyte solution may be given to you to drink over a short period of time. This solution is used to clean out your colon.  You should be present 60 minutes prior to your procedure or as directed by your caregiver. AFTER THE PROCEDURE   If you received a sedative or pain relieving medication, you will need to arrange for someone to drive you home.  Occasionally, there is a little blood passed with the first bowel movement. Do not be concerned. FINDING OUT THE RESULTS OF YOUR TEST Not all test results are available during your visit. If your test results are  not back during the visit, make an appointment with your caregiver to find out the results. Do not assume everything is normal if you have not heard from your caregiver or the medical facility. It is important for you to follow up on all of your test results. HOME CARE INSTRUCTIONS   It is not unusual to pass moderate amounts of gas and experience mild abdominal cramping following the procedure. This is due to air being used to inflate your colon during the exam. Walking or a warm pack on your belly (abdomen) may help.  You may resume all normal meals and activities after sedatives and medicines have worn off.  Only take over-the-counter or prescription medicines for pain, discomfort, or fever as directed by your caregiver. Do not use aspirin or blood thinners if a biopsy was taken. Consult your caregiver for medicine usage if biopsies were taken. SEEK IMMEDIATE MEDICAL CARE IF:   You have a fever.  You pass large blood clots or fill a toilet with blood following the procedure. This may also occur 10 to 14 days following the procedure. This is more likely if a biopsy was taken.  You develop abdominal pain that keeps getting worse and cannot be relieved with medicine. Document Released: 07/29/2000 Document Revised: 10/24/2011 Document Reviewed: 03/13/2008 ExitCare Patient Information 2013 [employer].  

## 2012-06-15 ENCOUNTER — Telehealth: Payer: Self-pay | Admitting: *Deleted

## 2012-06-15 NOTE — Telephone Encounter (Signed)
Called and left message for pt to pick up suprep sample kit

## 2012-07-05 ENCOUNTER — Encounter: Payer: BC Managed Care – PPO | Admitting: Gastroenterology

## 2012-07-20 ENCOUNTER — Encounter: Payer: BC Managed Care – PPO | Admitting: Gastroenterology

## 2013-06-04 ENCOUNTER — Encounter: Payer: Self-pay | Admitting: Gastroenterology

## 2013-07-16 ENCOUNTER — Ambulatory Visit (AMBULATORY_SURGERY_CENTER): Payer: BC Managed Care – PPO

## 2013-07-16 VITALS — Ht 62.0 in | Wt 149.2 lb

## 2013-07-16 DIAGNOSIS — Z86010 Personal history of colonic polyps: Secondary | ICD-10-CM

## 2013-07-16 MED ORDER — NA SULFATE-K SULFATE-MG SULF 17.5-3.13-1.6 GM/177ML PO SOLN: ORAL | Status: DC

## 2013-07-17 ENCOUNTER — Encounter: Payer: BC Managed Care – PPO | Admitting: Gastroenterology

## 2013-07-17 ENCOUNTER — Encounter: Payer: Self-pay | Admitting: Gastroenterology

## 2013-07-26 ENCOUNTER — Encounter: Payer: Self-pay | Admitting: Gastroenterology

## 2013-07-26 ENCOUNTER — Ambulatory Visit (AMBULATORY_SURGERY_CENTER): Payer: BC Managed Care – PPO | Admitting: Gastroenterology

## 2013-07-26 VITALS — BP 159/88 | HR 67 | Temp 96.7°F | Resp 17 | Ht 62.0 in | Wt 149.0 lb

## 2013-07-26 DIAGNOSIS — Z86010 Personal history of colonic polyps: Secondary | ICD-10-CM

## 2013-07-26 DIAGNOSIS — K648 Other hemorrhoids: Secondary | ICD-10-CM

## 2013-07-26 DIAGNOSIS — K5730 Diverticulosis of large intestine without perforation or abscess without bleeding: Secondary | ICD-10-CM

## 2013-07-26 MED ORDER — SODIUM CHLORIDE 0.9 % IV SOLN: 500.0000 mL | INTRAVENOUS | Status: DC

## 2013-07-26 NOTE — Patient Instructions (Signed)
Impressions/recommendations:  Diverticulosis (handout given) High Fiber diet (handout given)  Hemorrhoids (handout given)  Repeat colonoscopy in 10 years.  YOU HAD AN ENDOSCOPIC PROCEDURE TODAY AT THE [facility]: Refer to the procedure report that was given to you for any specific questions about what was found during the examination.  If the procedure report does not answer your questions, please call your gastroenterologist to clarify.  If you requested that your care partner not be given the details of your procedure findings, then the procedure report has been included in a sealed envelope for you to review at your convenience later.  YOU SHOULD EXPECT: Some feelings of bloating in the abdomen. Passage of more gas than usual.  Walking can help get rid of the air that was put into your GI tract during the procedure and reduce the bloating. If you had a lower endoscopy (such as a colonoscopy or flexible sigmoidoscopy) you may notice spotting of blood in your stool or on the toilet paper. If you underwent a bowel prep for your procedure, then you may not have a normal bowel movement for a few days.  DIET: Your first meal following the procedure should be a light meal and then it is ok to progress to your normal diet.  A half-sandwich or bowl of soup is an example of a good first meal.  Heavy or fried foods are harder to digest and may make you feel nauseous or bloated.  Likewise meals heavy in dairy and vegetables can cause extra gas to form and this can also increase the bloating.  Drink plenty of fluids but you should avoid alcoholic beverages for 24 hours.  ACTIVITY: Your care partner should take you home directly after the procedure.  You should plan to take it easy, moving slowly for the rest of the day.  You can resume normal activity the day after the procedure however you should NOT DRIVE or use heavy machinery for 24 hours (because of the sedation medicines used during the  test).    SYMPTOMS TO REPORT IMMEDIATELY: A gastroenterologist can be reached at any hour.  During normal business hours, 8:30 AM to 5:00 PM Monday through Friday, call [phone].  After hours and on weekends, please call the GI answering service at [phone] who will take a message and have the physician on call contact you.   Following lower endoscopy (colonoscopy or flexible sigmoidoscopy):  Excessive amounts of blood in the stool  Significant tenderness or worsening of abdominal pains  Swelling of the abdomen that is new, acute  Fever of 100F or higher  FOLLOW UP: If any biopsies were taken you will be contacted by phone or by letter within the next 1-3 weeks.  Call your gastroenterologist if you have not heard about the biopsies in 3 weeks.  Our staff will call the home number listed on your records the next business day following your procedure to check on you and address any questions or concerns that you may have at that time regarding the information given to you following your procedure. This is a courtesy call and so if there is no answer at the home number and we have not heard from you through the emergency physician on call, we will assume that you have returned to your regular daily activities without incident.  SIGNATURES/CONFIDENTIALITY: You and/or your care partner have signed paperwork which will be entered into your electronic medical record.  These signatures attest to the fact that that the  information above on your After Visit Summary has been reviewed and is understood.  Full responsibility of the confidentiality of this discharge information lies with you and/or your care-partner.

## 2013-07-26 NOTE — Progress Notes (Signed)
No egg or soy allergy. ewm 

## 2013-07-26 NOTE — Progress Notes (Signed)
Patient did not experience any of the following events: a burn prior to discharge; a fall within the facility; wrong site/side/patient/procedure/implant event; or a hospital transfer or hospital admission upon discharge from the facility. (G8907) Patient did not have preoperative order for IV antibiotic SSI prophylaxis. (G8918)  

## 2013-07-26 NOTE — Op Note (Signed)
[facility] [address] [place], [address]   COLONOSCOPY PROCEDURE REPORT  PATIENT: [name], [name]  MR#: [id number] BIRTHDATE: 09-12-1950 , 62  yrs. old GENDER: Female ENDOSCOPIST: Louis Meckel, MD REFERRED BY: PROCEDURE DATE:  07/26/2013 PROCEDURE:   Colonoscopy, diagnostic First Screening Colonoscopy - Avg.  risk and is 50 yrs.  old or older - No.  Prior Negative Screening - Now for repeat screening. N/A  History of Adenoma - Now for follow-up colonoscopy & has been > or = to 3 yrs.  Yes hx of adenoma.  Has been 3 or more years since last colonoscopy.  Polyps Removed Today? No.  Recommend repeat exam, <10 yrs? No. ASA CLASS:   Class II INDICATIONS:Patient's personal history of adenomatous colon polyps. 2005 MEDICATIONS: MAC sedation, administered by CRNA and Propofol (Diprivan) 330 mg IV  DESCRIPTION OF PROCEDURE:   After the risks benefits and alternatives of the procedure were thoroughly explained, informed consent was obtained.  A digital rectal exam revealed no abnormalities of the rectum.   The LB [id number]  endoscope was introduced through the anus and advanced to the terminal ileum which was intubated for a short distance. No adverse events experienced.   The quality of the prep was Suprep good  The instrument was then slowly withdrawn as the colon was fully examined.      COLON FINDINGS: Mild diverticulosis was noted in the sigmoid colon. The colon was otherwise normal.  There was no diverticulosis, inflammation, polyps or cancers unless previously stated. Internal hemorrhoids were found.  Retroflexed views revealed no abnormalities. The time to cecum=3 minutes 54 seconds.  Withdrawal time=9 minutes 10 seconds.  The scope was withdrawn and the procedure completed. COMPLICATIONS: There were no complications.  ENDOSCOPIC IMPRESSION: 1.   Mild diverticulosis was noted in the sigmoid colon 2.  internal  hemorrhoids   RECOMMENDATIONS: Colonoscopy 10 years   eSigned:  Louis Meckel, MD 07/26/2013 3:33 PM   cc: Marylu Lund Dear, MD   PATIENT NAME:  [name], [name] MR#: [id number]

## 2013-07-26 NOTE — Progress Notes (Signed)
Report to pacu rn, vss, bbs=clear 

## 2013-07-29 ENCOUNTER — Telehealth: Payer: Self-pay | Admitting: *Deleted

## 2013-07-29 NOTE — Telephone Encounter (Signed)
Left message that we called for f/u 

## 2013-09-16 ENCOUNTER — Encounter: Payer: Self-pay | Admitting: Cardiovascular Disease

## 2013-09-16 ENCOUNTER — Ambulatory Visit (INDEPENDENT_AMBULATORY_CARE_PROVIDER_SITE_OTHER): Payer: BC Managed Care – PPO | Admitting: Cardiovascular Disease

## 2013-09-16 VITALS — BP 140/82 | HR 73 | Ht 62.0 in | Wt 152.5 lb

## 2013-09-16 DIAGNOSIS — R0789 Other chest pain: Secondary | ICD-10-CM

## 2013-09-16 DIAGNOSIS — I493 Ventricular premature depolarization: Secondary | ICD-10-CM

## 2013-09-16 DIAGNOSIS — R002 Palpitations: Secondary | ICD-10-CM

## 2013-09-16 DIAGNOSIS — I4891 Unspecified atrial fibrillation: Secondary | ICD-10-CM

## 2013-09-16 DIAGNOSIS — R42 Dizziness and giddiness: Secondary | ICD-10-CM

## 2013-09-16 DIAGNOSIS — I499 Cardiac arrhythmia, unspecified: Secondary | ICD-10-CM

## 2013-09-16 DIAGNOSIS — I4949 Other premature depolarization: Secondary | ICD-10-CM

## 2013-09-16 DIAGNOSIS — R079 Chest pain, unspecified: Secondary | ICD-10-CM

## 2013-09-16 DIAGNOSIS — I491 Atrial premature depolarization: Secondary | ICD-10-CM

## 2013-09-16 DIAGNOSIS — E785 Hyperlipidemia, unspecified: Secondary | ICD-10-CM | POA: Insufficient documentation

## 2013-09-16 NOTE — Assessment & Plan Note (Signed)
Concerning for APCs, PVCs. Unable to exclude other arrhythmia such as atrial fibrillation. 48 hour Holter monitor has been ordered. If she continues to have symptoms, 30 day monitor could be ordered. We'll hold off on starting beta blockers at this time.  These could be used for symptom relief if needed. This would include low-dose metoprolol tartrate, even propranolol when necessary

## 2013-09-16 NOTE — Patient Instructions (Signed)
You are doing well. No medication changes were made.  We will order a 48 hour holter monitor for arrhythmia, possible APCs, PVC, rule out atrial fibrillation  Please call us if you have new issues that need to be addressed before your next appt.

## 2013-09-16 NOTE — Assessment & Plan Note (Signed)
No recent episodes of chest pain and she's had in the past. Some vague chest tightness, possibly from significant stress at work. Suggested she call our office if symptoms get worse. Stress test to be done for worsening symptoms

## 2013-09-16 NOTE — Progress Notes (Signed)
Patient ID: [name] [name], female    DOB: 1951/07/31, 63 y.o.   MRN: [id number]  HPI Comments: [name] [name] is a very pleasant 63 year old woman  with a history of GERD, hypertension,  long history of chest discomfort, who presents for routine office visit. significant headaches in the past, now improved   EGD several years ago for dyspepsia that demonstrated esophagitis.  In followup today, she reports that she has been having significant palpitations/arrhythmia over the past several weeks. She does have a recent upper respiratory infection and symptoms started prior to her URI. She has fatigue, episodes of dizziness, and aching in her chest at times. Fluttering in her chest sometimes last for several seconds, other times for longer period of time. It can happen in the daytime or at nighttime, not associated with rest or exertion.   she previously used to have a reverse exercise schedule though has not done this in several months. She does have significant stress at work She does report having recent change to her thyroid medication dosing. Uncertain if this is playing a role in her symptoms   No recent chest pain burning or chest pain symptoms that she had previously on her last office visit in 2013. Vitamin D is low on recent blood work.  Total cholesterol 223, LDL 127, TSH 5.7, hemoglobin A1c 5.3, creatinine 0.71, normal LFTs, vitamin D 27 She has increased the dose of her vitamin D supplement on her own as she did not receive a call from her primary care physician   EKG shows normal sinus rhythm with rate 73 beats per minute, no significant ST or T wave changes.     Outpatient Encounter Prescriptions as of 09/16/2013  Medication Sig  . Amino Acids (AMINO ACID PO) Take by mouth. ALA 300 mg -Take one daily  . aspirin 81 MG tablet Take 81 mg by mouth daily.    . Calcium Carbonate-Vitamin D (OS-CAL 500 + D PO) Take 1 tablet by mouth daily.    . Calcium-Magnesium (CAL/MAG CITRATE) 250-125 MG  TABS Take 1 tablet by mouth daily.    . cholecalciferol (VITAMIN D) 1000 UNITS tablet Take 1,000 Units by mouth daily.  . Coenzyme Q10-Red Yeast Rice 25-600 MG CAPS Take 1 capsule by mouth daily.  . Eszopiclone (ESZOPICLONE) 3 MG TABS Take 1.5 mg by mouth at bedtime as needed. Take immediately before bedtime   . fluticasone (FLONASE) 50 MCG/ACT nasal spray Place into both nostrils daily.  Marland Kitchen levothyroxine (SYNTHROID, LEVOTHROID) 100 MCG tablet Take 150 mcg by mouth daily.   . Multiple Vitamin (MULTIVITAMIN) tablet Take 1 tablet by mouth daily.    . OMEGA 3 340 MG CPDR Take 1 capsule by mouth daily.    . progesterone (PROMETRIUM) 100 MG capsule Take 100 mg by mouth. Take 25 days out of a month  . Red Yeast Rice 600 MG TABS Take by mouth daily.     Review of Systems  Constitutional: Positive for fatigue.  HENT: Negative.   Eyes: Negative.   Respiratory: Negative.   Cardiovascular: Positive for palpitations.  Gastrointestinal: Negative.   Endocrine: Negative.   Musculoskeletal: Negative.   Skin: Negative.   Allergic/Immunologic: Negative.   Neurological: Positive for dizziness.  Hematological: Negative.   Psychiatric/Behavioral: Negative.   All other systems reviewed and are negative.   BP 140/82  Pulse 73  Ht 5\' 2"  (1.575 m)  Wt 152 lb 8 oz (69.174 kg)  BMI 27.89 kg/m2  Physical Exam  Nursing note  and vitals reviewed. Constitutional: She is oriented to person, place, and time. She appears well-developed and well-nourished.  HENT:  Head: Normocephalic.  Nose: Nose normal.  Mouth/Throat: Oropharynx is clear and moist.  Eyes: Conjunctivae are normal. Pupils are equal, round, and reactive to light.  Neck: Normal range of motion. Neck supple. No JVD present.  Cardiovascular: Normal rate, regular rhythm, S1 normal, S2 normal, normal heart sounds and intact distal pulses.  Exam reveals no gallop and no friction rub.   No murmur heard. Pulmonary/Chest: Effort normal and breath  sounds normal. No respiratory distress. She has no wheezes. She has no rales. She exhibits no tenderness.  Abdominal: Soft. Bowel sounds are normal. She exhibits no distension. There is no tenderness.  Musculoskeletal: Normal range of motion. She exhibits no edema and no tenderness.  Lymphadenopathy:    She has no cervical adenopathy.  Neurological: She is alert and oriented to person, place, and time. Coordination normal.  Skin: Skin is warm and dry. No rash noted. No erythema.  Psychiatric: She has a normal mood and affect. Her behavior is normal. Judgment and thought content normal.    Assessment and Plan

## 2013-09-16 NOTE — Assessment & Plan Note (Signed)
No new medications no new medications started on today's visit. We'll discuss this with her again in followup. Uncertain if she would like to start a statin. We'll not add new medications while she is having other symptoms of possible arrhythmia.

## 2013-09-26 DIAGNOSIS — I4891 Unspecified atrial fibrillation: Secondary | ICD-10-CM

## 2013-10-15 ENCOUNTER — Telehealth: Payer: Self-pay

## 2013-10-15 NOTE — Telephone Encounter (Signed)
Reviewed results w/ pt.   "NSR w/ short runs of atrial tachycardia Rare APCs & PVCs" Pt verbalizes understanding.

## 2013-10-15 NOTE — Telephone Encounter (Signed)
Pt would like holter monitor results.  

## 2013-10-17 ENCOUNTER — Other Ambulatory Visit: Payer: Self-pay

## 2013-10-17 ENCOUNTER — Encounter (INDEPENDENT_AMBULATORY_CARE_PROVIDER_SITE_OTHER): Payer: BC Managed Care – PPO

## 2013-10-17 DIAGNOSIS — I499 Cardiac arrhythmia, unspecified: Secondary | ICD-10-CM

## 2013-10-17 DIAGNOSIS — I491 Atrial premature depolarization: Secondary | ICD-10-CM

## 2013-10-17 DIAGNOSIS — I4891 Unspecified atrial fibrillation: Secondary | ICD-10-CM

## 2013-10-17 DIAGNOSIS — R42 Dizziness and giddiness: Secondary | ICD-10-CM

## 2013-10-17 DIAGNOSIS — I493 Ventricular premature depolarization: Secondary | ICD-10-CM

## 2013-10-17 DIAGNOSIS — R0789 Other chest pain: Secondary | ICD-10-CM

## 2013-10-30 ENCOUNTER — Encounter (INDEPENDENT_AMBULATORY_CARE_PROVIDER_SITE_OTHER): Payer: Self-pay

## 2013-10-30 ENCOUNTER — Encounter: Payer: Self-pay | Admitting: Internal Medicine

## 2013-10-30 ENCOUNTER — Ambulatory Visit (INDEPENDENT_AMBULATORY_CARE_PROVIDER_SITE_OTHER): Payer: BC Managed Care – PPO | Admitting: Internal Medicine

## 2013-10-30 VITALS — BP 120/80 | HR 65 | Temp 98.2°F | Ht 63.0 in | Wt 150.2 lb

## 2013-10-30 DIAGNOSIS — C4491 Basal cell carcinoma of skin, unspecified: Secondary | ICD-10-CM

## 2013-10-30 DIAGNOSIS — Z86010 Personal history of colonic polyps: Secondary | ICD-10-CM

## 2013-10-30 DIAGNOSIS — E039 Hypothyroidism, unspecified: Secondary | ICD-10-CM

## 2013-10-30 DIAGNOSIS — Z9109 Other allergy status, other than to drugs and biological substances: Secondary | ICD-10-CM

## 2013-10-30 DIAGNOSIS — N898 Other specified noninflammatory disorders of vagina: Secondary | ICD-10-CM

## 2013-10-30 DIAGNOSIS — N9489 Other specified conditions associated with female genital organs and menstrual cycle: Secondary | ICD-10-CM

## 2013-10-30 DIAGNOSIS — E785 Hyperlipidemia, unspecified: Secondary | ICD-10-CM

## 2013-10-30 DIAGNOSIS — R002 Palpitations: Secondary | ICD-10-CM

## 2013-10-30 LAB — COMPREHENSIVE METABOLIC PANEL
ALT: 28 U/L (ref 0–35)
AST: 25 U/L (ref 0–37)
Albumin: 4.9 g/dL (ref 3.5–5.2)
Alkaline Phosphatase: 76 U/L (ref 39–117)
BUN: 19 mg/dL (ref 6–23)
CALCIUM: 9.5 mg/dL (ref 8.4–10.5)
CHLORIDE: 104 meq/L (ref 96–112)
CO2: 29 meq/L (ref 19–32)
CREATININE: 0.7 mg/dL (ref 0.4–1.2)
GFR: 89.43 mL/min (ref >60.00)
GLUCOSE: 101 mg/dL — AB (ref 70–99)
Potassium: 4.4 mEq/L (ref 3.5–5.1)
Sodium: 140 mEq/L (ref 135–145)
Total Bilirubin: 0.6 mg/dL (ref 0.3–1.2)
Total Protein: 7.1 g/dL (ref 6.0–8.3)

## 2013-10-30 LAB — CBC WITH DIFFERENTIAL/PLATELET
BASOS PCT: 0.4 % (ref 0.0–3.0)
Basophils Absolute: 0 10*3/uL (ref 0.0–0.1)
EOS PCT: 3.3 % (ref 0.0–5.0)
Eosinophils Absolute: 0.3 10*3/uL (ref 0.0–0.7)
HCT: 42.5 % (ref 36.0–46.0)
HEMOGLOBIN: 14.4 g/dL (ref 12.0–15.0)
LYMPHS PCT: 18.8 % (ref 12.0–46.0)
Lymphs Abs: 1.4 10*3/uL (ref 0.7–4.0)
MCHC: 33.8 g/dL (ref 30.0–36.0)
MCV: 89.4 fl (ref 78.0–100.0)
MONOS PCT: 6.7 % (ref 3.0–12.0)
Monocytes Absolute: 0.5 10*3/uL (ref 0.1–1.0)
Neutro Abs: 5.5 10*3/uL (ref 1.4–7.7)
Neutrophils Relative %: 70.8 % (ref 43.0–77.0)
Platelets: 264 10*3/uL (ref 150.0–400.0)
RBC: 4.75 Mil/uL (ref 3.87–5.11)
RDW: 12.7 % (ref 11.5–14.6)
WBC: 7.7 10*3/uL (ref 4.5–10.5)

## 2013-10-30 LAB — LIPID PANEL
CHOLESTEROL: 220 mg/dL — AB (ref 0–200)
HDL: 82 mg/dL (ref >39.00)
LDL Cholesterol: 126 mg/dL — ABNORMAL HIGH (ref 0–99)
TRIGLYCERIDES: 60 mg/dL (ref 0.0–149.0)
Total CHOL/HDL Ratio: 3
VLDL: 12 mg/dL (ref 0.0–40.0)

## 2013-10-30 LAB — TSH: TSH: 1.47 u[IU]/mL (ref 0.35–5.50)

## 2013-10-30 NOTE — Progress Notes (Signed)
Pre-visit discussion using our clinic review tool. No additional management support is needed unless otherwise documented below in the visit note.  

## 2013-10-31 ENCOUNTER — Encounter: Payer: Self-pay | Admitting: *Deleted

## 2013-11-03 ENCOUNTER — Encounter: Payer: Self-pay | Admitting: Internal Medicine

## 2013-11-03 DIAGNOSIS — Z9109 Other allergy status, other than to drugs and biological substances: Secondary | ICD-10-CM | POA: Insufficient documentation

## 2013-11-03 DIAGNOSIS — C4491 Basal cell carcinoma of skin, unspecified: Secondary | ICD-10-CM | POA: Insufficient documentation

## 2013-11-03 DIAGNOSIS — N898 Other specified noninflammatory disorders of vagina: Secondary | ICD-10-CM | POA: Insufficient documentation

## 2013-11-03 NOTE — Assessment & Plan Note (Signed)
Allergies controlled with prn flonase.  Follow.

## 2013-11-03 NOTE — Assessment & Plan Note (Signed)
Had colonoscopy 12/14.  States ok.  Reports recommended f/u colonoscopy in 10 years.

## 2013-11-03 NOTE — Assessment & Plan Note (Signed)
Following with dermatology.  Follow.

## 2013-11-03 NOTE — Assessment & Plan Note (Signed)
Evaluated by cardiology.  Currently doing well.  Follow.  

## 2013-11-03 NOTE — Assessment & Plan Note (Signed)
Vaginal dryness.  Progesterone cream.

## 2013-11-03 NOTE — Progress Notes (Signed)
Subjective:    Patient ID: [name] [name], female    DOB: [birth year], 63 y.o.   MRN: [id number]  HPI 63 year old female with past history of allergies, arrhythmia, hypertension (on no medication) and hypothyroidism.  She comes in today to follow up on these issues as well as to establish care.  Blood pressure has been doing well on no medication.  Blood pressure mostly averaging 130s/80s - on outside checks.  Stays active.  No chest pain or tightness with increased activity or exertion.  Increased stress with her work.  Feels she is handling things relatively well.  Has seasonal allergies.  Controls with Flonase prn.  Previously saw Chapman Fitch.  Using progesterone for vaginal dryness.  No urinary symptoms.  Eating and drinking well.  Bowels stable.  Takes Lunesta to help her sleep.     Past Medical History  Diagnosis Date  . Adenomatous polyp 2005  . HTN (hypertension)   . Hypothyroidism   . Environmental allergies   . History of chicken pox   . Arrhythmia     H/O  . Hypercholesterolemia   . Basal cell carcinoma   . CTS (carpal tunnel syndrome)     Current Outpatient Prescriptions on File Prior to Visit  Medication Sig Dispense Refill  . Amino Acids (AMINO ACID PO) Take by mouth. ALA 300 mg -Take one daily      . Calcium Carbonate-Vitamin D (OS-CAL 500 + D PO) Take 1 tablet by mouth daily.        . Calcium-Magnesium (CAL/MAG CITRATE) 250-125 MG TABS Take 1 tablet by mouth daily.        . Coenzyme Q10-Red Yeast Rice 25-600 MG CAPS Take 1 capsule by mouth daily.      . Eszopiclone (ESZOPICLONE) 3 MG TABS Take 1.5 mg by mouth at bedtime as needed. Take immediately before bedtime       . fluticasone (FLONASE) 50 MCG/ACT nasal spray Place into both nostrils daily.      Marland Kitchen levothyroxine (SYNTHROID, LEVOTHROID) 100 MCG tablet Take 150 mcg by mouth daily.       . Multiple Vitamin (MULTIVITAMIN) tablet Take 1 tablet by mouth daily.        . OMEGA 3 340 MG CPDR Take 1 capsule by mouth  daily.        . progesterone (PROMETRIUM) 100 MG capsule Take 100 mg by mouth. Take 25 days out of a month      . Red Yeast Rice 600 MG TABS Take by mouth daily.       No current facility-administered medications on file prior to visit.    Review of Systems Patient denies any headache, lightheadedness or dizziness.  No significant sinus or allergy symptoms now.  No chest pain, tightness or palpitations.  No increased shortness of breath, cough or congestion.  No nausea or vomiting.  No acid reflux.  No abdominal pain or cramping.  No bowel change, such as diarrhea, constipation, BRBPR or melana.  No urine change.  Blood pressure as outlined.  Uses Lunesta to help her sleep.       Objective:   Physical Exam Filed Vitals:   10/30/13 0903  BP: 120/80  Pulse: 65  Temp: 98.2 F (56.41 C)   63 year old female in no acute distress.   HEENT:  Nares- clear.  Oropharynx - without lesions. NECK:  Supple.  Nontender.  No audible bruit.  HEART:  Appears to be regular. LUNGS:  No crackles  or wheezing audible.  Respirations even and unlabored.  RADIAL PULSE:  Equal bilaterally.  ABDOMEN:  Soft, nontender.  Bowel sounds present and normal.  No audible abdominal bruit.   EXTREMITIES:  No increased edema present.  DP pulses palpable and equal bilaterally.          Assessment & Plan:  BORDERLINE BLOOD PRESSURE.  Blood pressure doing well now on no medication.  Follow.    HEALTH MAINTENANCE.  Schedule her for a physical when due - 10/15.  Colonoscopy 2014.  Recommend a f/u colonoscopy in 10 years.  Mammogram 10/14 - ok.  Pap 2013 - negative.    I spent 45 minutes with the patient and more than 50% of the time was spent in consultation regarding the above.

## 2013-11-03 NOTE — Assessment & Plan Note (Signed)
On thyroid replacement.  Check tsh.  Dose just adjusted.

## 2013-11-03 NOTE — Assessment & Plan Note (Signed)
Low cholesterol diet and exercise.  Follow.  Check lipid profile.

## 2014-06-13 ENCOUNTER — Encounter: Payer: BC Managed Care – PPO | Admitting: Internal Medicine

## 2014-06-20 ENCOUNTER — Encounter: Payer: Self-pay | Admitting: Internal Medicine

## 2014-06-20 ENCOUNTER — Ambulatory Visit (INDEPENDENT_AMBULATORY_CARE_PROVIDER_SITE_OTHER): Payer: Commercial Managed Care - HMO | Admitting: Internal Medicine

## 2014-06-20 ENCOUNTER — Other Ambulatory Visit (HOSPITAL_COMMUNITY)
Admission: RE | Admit: 2014-06-20 | Discharge: 2014-06-20 | Disposition: A | Discharge status: Home / Self Care | Payer: Commercial Managed Care - HMO | Source: Ambulatory Visit | Attending: Internal Medicine | Admitting: Internal Medicine

## 2014-06-20 ENCOUNTER — Ambulatory Visit: Payer: Self-pay | Admitting: Internal Medicine

## 2014-06-20 VITALS — BP 130/80 | HR 65 | Temp 98.6°F | Ht 62.0 in | Wt 151.5 lb

## 2014-06-20 DIAGNOSIS — Z1151 Encounter for screening for human papillomavirus (HPV): Secondary | ICD-10-CM | POA: Insufficient documentation

## 2014-06-20 DIAGNOSIS — R030 Elevated blood-pressure reading, without diagnosis of hypertension: Secondary | ICD-10-CM | POA: Insufficient documentation

## 2014-06-20 DIAGNOSIS — N898 Other specified noninflammatory disorders of vagina: Secondary | ICD-10-CM

## 2014-06-20 DIAGNOSIS — Z860100 Personal history of colon polyps, unspecified: Secondary | ICD-10-CM

## 2014-06-20 DIAGNOSIS — E785 Hyperlipidemia, unspecified: Secondary | ICD-10-CM

## 2014-06-20 DIAGNOSIS — IMO0001 Reserved for inherently not codable concepts without codable children: Secondary | ICD-10-CM

## 2014-06-20 DIAGNOSIS — C4491 Basal cell carcinoma of skin, unspecified: Secondary | ICD-10-CM

## 2014-06-20 DIAGNOSIS — Z01419 Encounter for gynecological examination (general) (routine) without abnormal findings: Secondary | ICD-10-CM | POA: Insufficient documentation

## 2014-06-20 DIAGNOSIS — Z9109 Other allergy status, other than to drugs and biological substances: Secondary | ICD-10-CM

## 2014-06-20 DIAGNOSIS — Z91048 Other nonmedicinal substance allergy status: Secondary | ICD-10-CM

## 2014-06-20 DIAGNOSIS — Z23 Encounter for immunization: Secondary | ICD-10-CM

## 2014-06-20 DIAGNOSIS — E039 Hypothyroidism, unspecified: Secondary | ICD-10-CM

## 2014-06-20 LAB — BASIC METABOLIC PANEL
BUN: 17 mg/dL (ref 6–23)
CALCIUM: 9.1 mg/dL (ref 8.4–10.5)
CO2: 30 mEq/L (ref 19–32)
Chloride: 106 mEq/L (ref 96–112)
Creatinine, Ser: 0.7 mg/dL (ref 0.4–1.2)
GFR: 92.29 mL/min (ref >60.00)
Glucose, Bld: 93 mg/dL (ref 70–99)
Potassium: 4.4 mEq/L (ref 3.5–5.1)
Sodium: 140 mEq/L (ref 135–145)

## 2014-06-20 LAB — LIPID PANEL
Cholesterol: 216 mg/dL — ABNORMAL HIGH (ref 0–200)
HDL: 67.7 mg/dL (ref >39.00)
LDL Cholesterol: 138 mg/dL — ABNORMAL HIGH (ref 0–99)
NonHDL: 148.3
Total CHOL/HDL Ratio: 3
Triglycerides: 51 mg/dL (ref 0.0–149.0)
VLDL: 10.2 mg/dL (ref 0.0–40.0)

## 2014-06-20 LAB — HM MAMMOGRAPHY: HM Mammogram: NEGATIVE

## 2014-06-20 MED ORDER — ESTRADIOL 0.1 MG/GM VA CREA: 1.0000 | TOPICAL_CREAM | Freq: Every day | VAGINAL | Status: DC

## 2014-06-20 NOTE — Patient Instructions (Signed)
Use the estrogen cream - one applicator before bed for five nights and then 2x/week as needed.    Saline nasal spray - flush nose at least 2-3x/day.    Nasacort nasal spray - 2 sprays each nostril one time per day.  Do this in the evening.

## 2014-06-20 NOTE — Progress Notes (Signed)
Subjective:    Patient ID: [name] [name], female    DOB: 25-Nov-1950, 63 y.o.   MRN: [id number]  HPI 63 year old female with past history of allergies, arrhythmia, hypertension (on no medication) and hypothyroidism.  She comes in today to follow up on these issues as well as for a complete physical exam.   She reports bllood pressure has been doing well on no medication.  Stays active.  No chest pain or tightness with increased activity or exertion.  Increased stress with her work.  Feels she is handling things relatively well.  Planning to retire in December.  Has seasonal allergies.  Controls with Flonase prn.  Has noticed recently - cold symptoms.  Some increased stuffiness and drainage. some fatigue.  Is getting better.  Taking mucinex and zyrtec.  Previously saw Chapman Fitch.  Using progesterone for vaginal dryness.  Still with issues of vaginal dryness.  Would like to try something else.  We discussed treatment options - including estrogen cream.  No urinary symptoms.  Eating and drinking well.  Bowels stable.     Past Medical History  Diagnosis Date  . Adenomatous polyp 2005  . HTN (hypertension)   . Hypothyroidism   . Environmental allergies   . History of chicken pox   . Arrhythmia     H/O  . Hypercholesterolemia   . Basal cell carcinoma   . CTS (carpal tunnel syndrome)     Current Outpatient Prescriptions on File Prior to Visit  Medication Sig Dispense Refill  . Amino Acids (AMINO ACID PO) Take by mouth. ALA 300 mg -Take one daily    . Calcium-Magnesium (CAL/MAG CITRATE) 250-125 MG TABS Take 1 tablet by mouth daily.      . Cholecalciferol (VITAMIN D) 2000 UNITS tablet Take 2,000 Units by mouth daily.    . Coenzyme Q10-Red Yeast Rice 25-600 MG CAPS Take 1 capsule by mouth daily.    . Eszopiclone (ESZOPICLONE) 3 MG TABS Take 1.5 mg by mouth at bedtime as needed. Take immediately before bedtime     . fluticasone (FLONASE) 50 MCG/ACT nasal spray Place into both nostrils  daily.    Marland Kitchen levothyroxine (SYNTHROID, LEVOTHROID) 100 MCG tablet Take 150 mcg by mouth daily.     . Multiple Vitamin (MULTIVITAMIN) tablet Take 1 tablet by mouth daily.      . OMEGA 3 340 MG CPDR Take 1 capsule by mouth daily.      . progesterone (PROMETRIUM) 100 MG capsule Take 100 mg by mouth. Take 25 days out of a month    . Red Yeast Rice 600 MG TABS Take by mouth daily.     No current facility-administered medications on file prior to visit.    Review of Systems Patient denies any headache, lightheadedness or dizziness. Increased stuffiness and drainage as outlined.  Better now.   No chest pain, tightness or palpitations.  No increased shortness of breath or significant cough or congestion.  No nausea or vomiting.  No acid reflux.  No abdominal pain or cramping.  No bowel change, such as diarrhea, constipation, BRBPR or melana.  No urine change.  Vaginal dryness as outlined.         Objective:   Physical Exam  Filed Vitals:   06/20/14 0803  BP: 130/80  Pulse: 65  Temp: 98.6 F (35 C)   63 year old female in no acute distress.   HEENT:  Nares- clear.  Oropharynx - without lesions. NECK:  Supple.  Nontender.  No audible bruit.  HEART:  Appears to be regular. LUNGS:  No crackles or wheezing audible.  Respirations even and unlabored.  RADIAL PULSE:  Equal bilaterally.    BREASTS:  No nipple discharge or nipple retraction present.  Could not appreciate any distinct nodules or axillary adenopathy.  ABDOMEN:  Soft, nontender.  Bowel sounds present and normal.  No audible abdominal bruit.  GU:  Normal external genitalia.  Vaginal vault without lesions.  Cervix identified.  Pap performed. Could not appreciate any adnexal masses or tenderness.   RECTAL:  Heme negative.   EXTREMITIES:  No increased edema present.  DP pulses palpable and equal bilaterally.          Assessment & Plan:  1. Hypothyroidism, unspecified hypothyroidism type On thyroid replacement.  TSH check in 3/15 wnl.   Follow.   2. Basal cell carcinoma Followed by dermatology.   3. History of colonic polyps Had colonoscopy 12/14.  States ok.  Recommended f/u colonoscopy in 10 years.    4. Hyperlipidemia Low cholesterol diet and exercise.  Check lipid panel.    5. Environmental allergies Usually controls with prn flonase.  With some increased stuffiness and drainage.  Discussed continued use of robitussin and mucinex.  Saline nasal spray and flonase nasal spray as outlined.  Do not feel abx warranted.    6. Elevated blood pressure Blood pressure as outlined.  On no medication.  Have her to continue to spot check her pressure.    7. Vaginal dryness Discussed with her today.  Estrace cream as directed.  Follow.  Discussed side effects of estrogen usage.    HEALTH MAINTENANCE.  Physical today.  Colonoscopy 2014.  Recommend a f/u colonoscopy in 10 years.  Mammogram 05/31/13 - ok.  Scheduled for a f/u mammogram today.   Pap 2012 - negative.  Repeat pap today.

## 2014-06-20 NOTE — Progress Notes (Signed)
Pre visit review using our clinic review tool, if applicable. No additional management support is needed unless otherwise documented below in the visit note. 

## 2014-06-23 ENCOUNTER — Encounter: Payer: Self-pay | Admitting: Internal Medicine

## 2014-06-23 LAB — CYTOLOGY - PAP

## 2014-07-15 ENCOUNTER — Telehealth: Payer: Self-pay | Admitting: *Deleted

## 2014-07-15 NOTE — Telephone Encounter (Signed)
Ok to refill x 3 

## 2014-07-15 NOTE — Telephone Encounter (Signed)
Refill request from [facility] for E3 1mg / Test 1mg  vaginal tablet #15, previously Rx/d by Dr. Dear. Ok refill?

## 2014-07-16 NOTE — Telephone Encounter (Signed)
Called to pharmacy 

## 2014-08-11 ENCOUNTER — Other Ambulatory Visit: Payer: Self-pay | Admitting: Internal Medicine

## 2014-08-21 ENCOUNTER — Encounter: Payer: Self-pay | Admitting: *Deleted

## 2014-08-21 NOTE — Telephone Encounter (Signed)
From: [name] Sent: 05/26/2014 6:48 PM EDT To: Aviva Kluver Subject: RE:Flu Shot Clinic Saturday, 05/31/14 [place]  Thanks for the reminder. I had a flu shot given by the doctors ar [facility]. I will see you in November for my physical.  ----- Message ----- From: Genia Plants Sent: 05/26/2014 8:44 AM EDT To: [name] Subject: Flu Shot Clinic Saturday, 05/31/14 [facility] [employer] [facility] will hold a [employer] on Saturday, October 17 from 9 am to noon. In order to plan appropriately, we ask you to please call the office to schedule an appointment time during the clinic. Our schedulers are ready to assist you, Monday through Friday, 8a to 5p at [phone]. Thank you for choosing [facility] for your healthcare needs.

## 2014-08-29 ENCOUNTER — Other Ambulatory Visit: Payer: Self-pay | Admitting: Internal Medicine

## 2014-08-31 ENCOUNTER — Other Ambulatory Visit: Payer: Self-pay | Admitting: Internal Medicine

## 2014-10-20 ENCOUNTER — Ambulatory Visit (INDEPENDENT_AMBULATORY_CARE_PROVIDER_SITE_OTHER): Payer: PPO | Admitting: Internal Medicine

## 2014-10-20 ENCOUNTER — Encounter: Payer: Self-pay | Admitting: Internal Medicine

## 2014-10-20 VITALS — BP 122/80 | HR 70 | Temp 98.1°F | Ht 62.0 in | Wt 151.1 lb

## 2014-10-20 DIAGNOSIS — E039 Hypothyroidism, unspecified: Secondary | ICD-10-CM | POA: Diagnosis not present

## 2014-10-20 DIAGNOSIS — E559 Vitamin D deficiency, unspecified: Secondary | ICD-10-CM | POA: Diagnosis not present

## 2014-10-20 DIAGNOSIS — Z0000 Encounter for general adult medical examination without abnormal findings: Secondary | ICD-10-CM

## 2014-10-20 DIAGNOSIS — E785 Hyperlipidemia, unspecified: Secondary | ICD-10-CM

## 2014-10-20 DIAGNOSIS — G479 Sleep disorder, unspecified: Secondary | ICD-10-CM

## 2014-10-20 DIAGNOSIS — R030 Elevated blood-pressure reading, without diagnosis of hypertension: Secondary | ICD-10-CM | POA: Diagnosis not present

## 2014-10-20 DIAGNOSIS — IMO0001 Reserved for inherently not codable concepts without codable children: Secondary | ICD-10-CM

## 2014-10-20 DIAGNOSIS — Z86010 Personal history of colonic polyps: Secondary | ICD-10-CM

## 2014-10-20 LAB — COMPREHENSIVE METABOLIC PANEL
ALBUMIN: 4.5 g/dL (ref 3.5–5.2)
ALT: 61 U/L — ABNORMAL HIGH (ref 0–35)
AST: 33 U/L (ref 0–37)
Alkaline Phosphatase: 89 U/L (ref 39–117)
BUN: 12 mg/dL (ref 6–23)
CO2: 29 meq/L (ref 19–32)
Calcium: 9.3 mg/dL (ref 8.4–10.5)
Chloride: 106 mEq/L (ref 96–112)
Creatinine, Ser: 0.72 mg/dL (ref 0.40–1.20)
GFR: 86.31 mL/min (ref >60.00)
GLUCOSE: 84 mg/dL (ref 70–99)
POTASSIUM: 4.3 meq/L (ref 3.5–5.1)
SODIUM: 140 meq/L (ref 135–145)
TOTAL PROTEIN: 6.5 g/dL (ref 6.0–8.3)
Total Bilirubin: 0.5 mg/dL (ref 0.2–1.2)

## 2014-10-20 LAB — TSH: TSH: 2.01 u[IU]/mL (ref 0.35–4.50)

## 2014-10-20 LAB — LIPID PANEL
Cholesterol: 187 mg/dL (ref 0–200)
HDL: 64.9 mg/dL (ref >39.00)
LDL CALC: 107 mg/dL — AB (ref 0–99)
NonHDL: 122.1
TRIGLYCERIDES: 78 mg/dL (ref 0.0–149.0)
Total CHOL/HDL Ratio: 3
VLDL: 15.6 mg/dL (ref 0.0–40.0)

## 2014-10-20 LAB — VITAMIN D 25 HYDROXY (VIT D DEFICIENCY, FRACTURES): VITD: 42.42 ng/mL (ref 30.00–100.00)

## 2014-10-20 MED ORDER — FLUTICASONE PROPIONATE 50 MCG/ACT NA SUSP
2.0000 | Freq: Every day | NASAL | Status: DC
Start: 2014-10-20 — End: 2014-12-26

## 2014-10-20 NOTE — Progress Notes (Signed)
Pre visit review using our clinic review tool, if applicable. No additional management support is needed unless otherwise documented below in the visit note. 

## 2014-10-20 NOTE — Progress Notes (Signed)
Patient ID: [name] [name], female   DOB: Jul 27, 1951, 64 y.o.   MRN: [id number]   Subjective:    Patient ID: [name] [name], female    DOB: October 19, 1950, 64 y.o.   MRN: [id number]  HPI  Patient here for a scheduled follow up.  Has retired.  States she is doing well.  Feels better.  Goes to the [facility] three days per week.  Is walking.  No cardiac symptoms with increased activity or exertion.  Breathing stable.  Bowels stable.  Off her hormones.  Uses Lunesta to help her sleep.  Only takes 1/2.  Is going to try to taper.  Has tolerated Lunesta without side effects.     Past Medical History  Diagnosis Date  . Adenomatous polyp 2005  . HTN (hypertension)   . Hypothyroidism   . Environmental allergies   . History of chicken pox   . Arrhythmia     H/O  . Hypercholesterolemia   . Basal cell carcinoma   . CTS (carpal tunnel syndrome)     Current Outpatient Prescriptions on File Prior to Visit  Medication Sig Dispense Refill  . Calcium-Magnesium (CAL/MAG CITRATE) 250-125 MG TABS Take 1 tablet by mouth daily.      . Cholecalciferol (VITAMIN D) 2000 UNITS tablet Take 2,000 Units by mouth daily.    . Multiple Vitamin (MULTIVITAMIN) tablet Take 1 tablet by mouth daily.      . OMEGA 3 340 MG CPDR Take 1 capsule by mouth daily.      . Red Yeast Rice 600 MG TABS Take by mouth daily.    Marland Kitchen SYNTHROID 150 MCG tablet TAKE 1 TABLET EVERY DAY 90 tablet 0   No current facility-administered medications on file prior to visit.    Review of Systems  Constitutional: Negative for appetite change and unexpected weight change.  HENT: Negative for congestion and sinus pressure.   Respiratory: Negative for cough, chest tightness and shortness of breath.   Cardiovascular: Negative for chest pain, palpitations and leg swelling.  Gastrointestinal: Negative for nausea, vomiting, diarrhea and abdominal distention.  Neurological: Negative for dizziness, light-headedness and headaches.  Psychiatric/Behavioral:     Needs lunesta to help her sleep.         Objective:    Physical Exam  HENT:  Nose: Nose normal.  Mouth/Throat: Oropharynx is clear and moist.  Neck: Neck supple. No thyromegaly present.  Cardiovascular: Normal rate and regular rhythm.   Pulmonary/Chest: Breath sounds normal. No respiratory distress. She has no wheezes.  Abdominal: Soft. Bowel sounds are normal. There is no tenderness.  Musculoskeletal: She exhibits no edema or tenderness.  Lymphadenopathy:    She has no cervical adenopathy.    BP 122/80 mmHg  Pulse 70  Temp(Src) 98.1 F (36.7 C) (Oral)  Ht 5\' 2"  (1.575 m)  Wt 151 lb 2 oz (68.55 kg)  BMI 27.63 kg/m2  SpO2 96% Wt Readings from Last 3 Encounters:  10/20/14 151 lb 2 oz (68.55 kg)  06/20/14 151 lb 8 oz (68.72 kg)  10/30/13 150 lb 4 oz (68.153 kg)     Lab Results  Component Value Date   WBC 7.7 10/30/2013   HGB 14.4 10/30/2013   HCT 42.5 10/30/2013   PLT 264.0 10/30/2013   GLUCOSE 84 10/20/2014   CHOL 187 10/20/2014   TRIG 78.0 10/20/2014   HDL 64.90 10/20/2014   LDLDIRECT 118.2 10/16/2006   LDLCALC 107* 10/20/2014   ALT 61* 10/20/2014   AST 33 10/20/2014   NA  140 10/20/2014   K 4.3 10/20/2014   CL 106 10/20/2014   CREATININE 0.72 10/20/2014   BUN 12 10/20/2014   CO2 29 10/20/2014   TSH 2.01 10/20/2014       Assessment & Plan:   Problem List Items Addressed This Visit    Elevated blood pressure    Blood pressure on check here ok.  Follow pressures.        Relevant Orders   Comprehensive metabolic panel (Completed)   Health care maintenance    Physical 06/20/14.  Colonoscopy 07/2013.  Mammogram 06/20/14 - birads I.        History of colonic polyps    Had colonoscopy 07/2013.        Hyperlipidemia    Low cholesterol diet and exercise.  Follow lipid panel.        Relevant Orders   Lipid panel (Completed)   Hypothyroidism - Primary    On synthroid.  Follow tsh.       Relevant Orders   TSH (Completed)   Sleeping difficulty      Requires lunesta.  Has been on for years.  Tolerating without side effects.  Only takes 1/2 tablet q hs.  Is going to try to taper.  Needs now.  Will see if can get insurance authorization.         Other Visit Diagnoses    Vitamin D deficiency        Relevant Orders    Vit D  25 hydroxy (rtn osteoporosis monitoring) (Completed)        Einar Pheasant, MD

## 2014-10-21 ENCOUNTER — Encounter: Payer: Self-pay | Admitting: Internal Medicine

## 2014-10-21 ENCOUNTER — Telehealth: Payer: Self-pay | Admitting: Internal Medicine

## 2014-10-21 DIAGNOSIS — R945 Abnormal results of liver function studies: Secondary | ICD-10-CM

## 2014-10-21 DIAGNOSIS — R7989 Other specified abnormal findings of blood chemistry: Secondary | ICD-10-CM

## 2014-10-21 NOTE — Telephone Encounter (Signed)
Pt was notified of labs via my chart.  Needs a f/u non fasting lab appt in 2 weeks.  Please notify the patient of the appointment date and time.  Thanks.   

## 2014-10-23 ENCOUNTER — Other Ambulatory Visit: Payer: Self-pay | Admitting: Internal Medicine

## 2014-10-23 NOTE — Telephone Encounter (Signed)
Refilled lunesta #30 with no refills.

## 2014-10-23 NOTE — Telephone Encounter (Signed)
rx faxed

## 2014-10-23 NOTE — Telephone Encounter (Signed)
Unread mychart message mailed to patient 

## 2014-10-23 NOTE — Telephone Encounter (Signed)
Last refill 1.15.16, last OV 3.7.16.  Please advise refill.

## 2014-10-26 ENCOUNTER — Encounter: Payer: Self-pay | Admitting: Internal Medicine

## 2014-10-26 DIAGNOSIS — G479 Sleep disorder, unspecified: Secondary | ICD-10-CM | POA: Insufficient documentation

## 2014-10-26 DIAGNOSIS — Z0000 Encounter for general adult medical examination without abnormal findings: Secondary | ICD-10-CM | POA: Insufficient documentation

## 2014-10-26 NOTE — Assessment & Plan Note (Signed)
Physical 06/20/14.  Colonoscopy 07/2013.  Mammogram 06/20/14 - birads I.

## 2014-10-26 NOTE — Assessment & Plan Note (Signed)
Low cholesterol diet and exercise.  Follow lipid panel.   

## 2014-10-26 NOTE — Assessment & Plan Note (Signed)
Requires lunesta.  Has been on for years.  Tolerating without side effects.  Only takes 1/2 tablet q hs.  Is going to try to taper.  Needs now.  Will see if can get insurance authorization.

## 2014-10-26 NOTE — Assessment & Plan Note (Signed)
Blood pressure on check here ok.  Follow pressures.

## 2014-10-26 NOTE — Assessment & Plan Note (Signed)
On synthroid.  Follow tsh.   

## 2014-10-26 NOTE — Assessment & Plan Note (Signed)
Had colonoscopy 07/2013.

## 2014-11-04 ENCOUNTER — Other Ambulatory Visit (INDEPENDENT_AMBULATORY_CARE_PROVIDER_SITE_OTHER): Payer: PPO

## 2014-11-04 DIAGNOSIS — R7989 Other specified abnormal findings of blood chemistry: Secondary | ICD-10-CM

## 2014-11-04 DIAGNOSIS — R945 Abnormal results of liver function studies: Secondary | ICD-10-CM

## 2014-11-04 LAB — HEPATIC FUNCTION PANEL
ALBUMIN: 4.3 g/dL (ref 3.5–5.2)
ALK PHOS: 89 U/L (ref 39–117)
ALT: 46 U/L — AB (ref 0–35)
AST: 26 U/L (ref 0–37)
Bilirubin, Direct: 0.1 mg/dL (ref 0.0–0.3)
TOTAL PROTEIN: 6.1 g/dL (ref 6.0–8.3)
Total Bilirubin: 0.5 mg/dL (ref 0.2–1.2)

## 2014-11-08 ENCOUNTER — Encounter: Payer: Self-pay | Admitting: Internal Medicine

## 2014-11-08 ENCOUNTER — Telehealth: Payer: Self-pay | Admitting: Internal Medicine

## 2014-11-08 DIAGNOSIS — R7989 Other specified abnormal findings of blood chemistry: Secondary | ICD-10-CM

## 2014-11-08 DIAGNOSIS — R945 Abnormal results of liver function studies: Secondary | ICD-10-CM

## 2014-11-08 NOTE — Telephone Encounter (Signed)
Pt was notified of labs via my chart.  Needs a f/u non fasting lab appt in 4 weeks.  Please notify the patient of the appointment date and time.  Thanks.

## 2014-11-10 NOTE — Telephone Encounter (Signed)
Order placed for abdominal ultrasound.   

## 2014-11-10 NOTE — Telephone Encounter (Signed)
Pt lab appt has been scheduled & pt would like to proceed with abdominal ultrasound.

## 2014-11-12 ENCOUNTER — Telehealth: Payer: Self-pay | Admitting: *Deleted

## 2014-11-12 NOTE — Telephone Encounter (Signed)
Received auth for Eszopicolone 3mg  Eff: 11/11/14-13/21/16. Copy faxed to pharmacy

## 2014-11-13 ENCOUNTER — Other Ambulatory Visit: Payer: Self-pay | Admitting: Internal Medicine

## 2014-11-19 ENCOUNTER — Encounter: Payer: Self-pay | Admitting: Internal Medicine

## 2014-11-19 ENCOUNTER — Other Ambulatory Visit (INDEPENDENT_AMBULATORY_CARE_PROVIDER_SITE_OTHER): Payer: PPO

## 2014-11-19 DIAGNOSIS — R7989 Other specified abnormal findings of blood chemistry: Secondary | ICD-10-CM | POA: Diagnosis not present

## 2014-11-19 DIAGNOSIS — R945 Abnormal results of liver function studies: Secondary | ICD-10-CM

## 2014-11-19 LAB — HEPATIC FUNCTION PANEL
ALBUMIN: 4.3 g/dL (ref 3.5–5.2)
ALT: 31 U/L (ref 0–35)
AST: 20 U/L (ref 0–37)
Alkaline Phosphatase: 84 U/L (ref 39–117)
BILIRUBIN DIRECT: 0.1 mg/dL (ref 0.0–0.3)
Total Bilirubin: 0.6 mg/dL (ref 0.2–1.2)
Total Protein: 6.5 g/dL (ref 6.0–8.3)

## 2014-11-21 ENCOUNTER — Telehealth: Payer: Self-pay | Admitting: Internal Medicine

## 2014-11-21 NOTE — Telephone Encounter (Signed)
Pt had called questioning if still needed abdominal ultrasound.  Please call pt and notify her that we can hold on abdominal ultrasound at this time.   Thanks.

## 2014-11-21 NOTE — Telephone Encounter (Signed)
Spoke with pt, advised of MDs message.  Pt verbalized understanding 

## 2014-12-02 ENCOUNTER — Encounter: Payer: Self-pay | Admitting: Internal Medicine

## 2014-12-25 ENCOUNTER — Other Ambulatory Visit: Payer: Self-pay | Admitting: Internal Medicine

## 2014-12-25 NOTE — Telephone Encounter (Signed)
Ok to fill 

## 2014-12-25 NOTE — Telephone Encounter (Signed)
Rx faxed

## 2014-12-25 NOTE — Telephone Encounter (Signed)
Refilled #30 with one refill

## 2014-12-26 ENCOUNTER — Other Ambulatory Visit: Payer: Self-pay | Admitting: Internal Medicine

## 2015-01-19 ENCOUNTER — Ambulatory Visit: Payer: PPO

## 2015-03-03 ENCOUNTER — Ambulatory Visit (INDEPENDENT_AMBULATORY_CARE_PROVIDER_SITE_OTHER): Payer: PPO | Admitting: Internal Medicine

## 2015-03-03 ENCOUNTER — Encounter: Payer: Self-pay | Admitting: Internal Medicine

## 2015-03-03 VITALS — BP 120/80 | HR 61 | Temp 98.8°F | Ht 62.0 in | Wt 145.5 lb

## 2015-03-03 DIAGNOSIS — Z0000 Encounter for general adult medical examination without abnormal findings: Secondary | ICD-10-CM

## 2015-03-03 DIAGNOSIS — L659 Nonscarring hair loss, unspecified: Secondary | ICD-10-CM

## 2015-03-03 DIAGNOSIS — R030 Elevated blood-pressure reading, without diagnosis of hypertension: Secondary | ICD-10-CM

## 2015-03-03 DIAGNOSIS — Z86010 Personal history of colonic polyps: Secondary | ICD-10-CM

## 2015-03-03 DIAGNOSIS — G479 Sleep disorder, unspecified: Secondary | ICD-10-CM

## 2015-03-03 DIAGNOSIS — C4491 Basal cell carcinoma of skin, unspecified: Secondary | ICD-10-CM

## 2015-03-03 DIAGNOSIS — E039 Hypothyroidism, unspecified: Secondary | ICD-10-CM | POA: Diagnosis not present

## 2015-03-03 DIAGNOSIS — IMO0001 Reserved for inherently not codable concepts without codable children: Secondary | ICD-10-CM

## 2015-03-03 DIAGNOSIS — E785 Hyperlipidemia, unspecified: Secondary | ICD-10-CM

## 2015-03-03 NOTE — Progress Notes (Signed)
Patient ID: Taler Kushner, female   DOB: [birth year], 64 y.o.   MRN: [id number]   Subjective:    Patient ID: [name], female    DOB: [birth year], 64 y.o.   MRN: [id number]  HPI  Patient here for a scheduled follow up.  She is exercising regularly.  Watching her diet.  Has lost some weight.  Feels better.  Has localized alopecia.  Seeing dermatology [facility] As[facility]/Dr Phillip Heal).  Just received steroid injection two weeks ago.  Has f/u planned with dermatology.  No cardiac symptoms with increased activity or exertion.  No sob.  Eating and drinking well.  Bowels stable.  States blood pressures averaging 662H  Systolic readings.     Past Medical History  Diagnosis Date  . Adenomatous polyp 2005  . HTN (hypertension)   . Hypothyroidism   . Environmental allergies   . History of chicken pox   . Arrhythmia     H/O  . Hypercholesterolemia   . Basal cell carcinoma   . CTS (carpal tunnel syndrome)     Outpatient Encounter Prescriptions as of 03/03/2015  Medication Sig  . BIOTIN PO Take 500 mg by mouth daily.  . Calcium-Magnesium (CAL/MAG CITRATE) 250-125 MG TABS Take 1 tablet by mouth daily.    . Cholecalciferol (VITAMIN D) 2000 UNITS tablet Take 2,000 Units by mouth daily.  . clobetasol (TEMOVATE) 0.05 % external solution Apply 1 application topically daily.  . Eszopiclone 3 MG TABS TAKE ONE TABLET AT BEDTIME  . fluticasone (FLONASE) 50 MCG/ACT nasal spray USE 2 SPRAYS IN EACH NOSTRIL EVERY DAY  . Multiple Vitamin (MULTIVITAMIN) tablet Take 1 tablet by mouth daily.    . OMEGA 3 340 MG CPDR Take 1 capsule by mouth daily.    . Red Yeast Rice 600 MG TABS Take by mouth daily.  Marland Kitchen SYNTHROID 150 MCG tablet TAKE 1 TABLET EVERY DAY   No facility-administered encounter medications on file as of 03/03/2015.    Review of Systems  Constitutional: Negative for appetite change and unexpected weight change.  HENT: Negative for congestion and sinus pressure.   Respiratory: Negative for cough, chest  tightness and shortness of breath.   Cardiovascular: Negative for chest pain, palpitations and leg swelling.  Gastrointestinal: Negative for nausea, vomiting, abdominal pain and diarrhea.  Skin: Negative for color change and rash.  Neurological: Negative for dizziness, light-headedness and headaches.  Psychiatric/Behavioral: Negative for dysphoric mood and agitation.       Objective:     Blood pressure recheck:  134/78  Physical Exam  Constitutional: She appears well-developed and well-nourished. No distress.  HENT:  Nose: Nose normal.  Mouth/Throat: Oropharynx is clear and moist.  Neck: Neck supple. No thyromegaly present.  Cardiovascular: Normal rate and regular rhythm.   Pulmonary/Chest: Breath sounds normal. No respiratory distress. She has no wheezes.  Abdominal: Soft. Bowel sounds are normal. There is no tenderness.  Musculoskeletal: She exhibits no edema or tenderness.  Lymphadenopathy:    She has no cervical adenopathy.  Skin: No rash noted. No erythema.  Psychiatric: She has a normal mood and affect. Her behavior is normal.    BP 120/80 mmHg  Pulse 61  Temp(Src) 98.8 F (37.1 C) (Oral)  Ht 5\' 2"  (1.575 m)  Wt 145 lb 8 oz (65.998 kg)  BMI 26.61 kg/m2  SpO2 97% Wt Readings from Last 3 Encounters:  03/03/15 145 lb 8 oz (65.998 kg)  10/20/14 151 lb 2 oz (68.55 kg)  06/20/14 151 lb 8 oz (68.72  kg)     Lab Results  Component Value Date   WBC 7.7 10/30/2013   HGB 14.4 10/30/2013   HCT 42.5 10/30/2013   PLT 264.0 10/30/2013   GLUCOSE 84 10/20/2014   CHOL 187 10/20/2014   TRIG 78.0 10/20/2014   HDL 64.90 10/20/2014   LDLDIRECT 118.2 10/16/2006   LDLCALC 107* 10/20/2014   ALT 31 11/19/2014   AST 20 11/19/2014   NA 140 10/20/2014   K 4.3 10/20/2014   CL 106 10/20/2014   CREATININE 0.72 10/20/2014   BUN 12 10/20/2014   CO2 29 10/20/2014   TSH 2.01 10/20/2014       Assessment & Plan:   Problem List Items Addressed This Visit    Alopecia    S/p  steroid injection.  Followed by dermatology.       Basal cell carcinoma    Followed by dermatology.       Elevated blood pressure    Blood pressures as outlined.  Have her spot check her pressures.  Send in readings over the next few weeks.        Health care maintenance    Physical 06/20/14.  Colonoscopy 07/2013.  Mammogram 06/20/14 - Birads I.        History of colonic polyps    Colonoscopy 07/2013.        Hyperlipidemia    Low cholesterol diet and exercise.  Follow lipid panel.       Hypothyroidism - Primary    On thyroid replacement.  Follow tsh.       Sleeping difficulty    Taking lunesta.  Plans to try to taper.            Einar Pheasant, MD

## 2015-03-03 NOTE — Progress Notes (Signed)
Pre visit review using our clinic review tool, if applicable. No additional management support is needed unless otherwise documented below in the visit note. 

## 2015-03-04 ENCOUNTER — Encounter: Payer: Self-pay | Admitting: Internal Medicine

## 2015-03-04 DIAGNOSIS — L659 Nonscarring hair loss, unspecified: Secondary | ICD-10-CM | POA: Insufficient documentation

## 2015-03-04 NOTE — Assessment & Plan Note (Signed)
On thyroid replacement.  Follow tsh.  

## 2015-03-04 NOTE — Assessment & Plan Note (Signed)
Colonoscopy 07/2013.

## 2015-03-04 NOTE — Assessment & Plan Note (Signed)
Taking lunesta.  Plans to try to taper.

## 2015-03-04 NOTE — Assessment & Plan Note (Signed)
Physical 06/20/14.  Colonoscopy 07/2013.  Mammogram 06/20/14 - Birads I.

## 2015-03-04 NOTE — Assessment & Plan Note (Signed)
S/p steroid injection.  Followed by dermatology.

## 2015-03-04 NOTE — Assessment & Plan Note (Signed)
Followed by dermatology

## 2015-03-04 NOTE — Assessment & Plan Note (Signed)
Low cholesterol diet and exercise.  Follow lipid panel.   

## 2015-03-04 NOTE — Assessment & Plan Note (Signed)
Blood pressures as outlined.  Have her spot check her pressures.  Send in readings over the next few weeks.

## 2015-03-23 ENCOUNTER — Encounter: Payer: Self-pay | Admitting: Internal Medicine

## 2015-04-22 ENCOUNTER — Other Ambulatory Visit: Payer: Self-pay | Admitting: Internal Medicine

## 2015-04-22 NOTE — Telephone Encounter (Signed)
Refilled lunesta #30 with 2 refills.

## 2015-04-22 NOTE — Telephone Encounter (Signed)
Last OV 7.19.16, next OV 11.10.16.  Please advise refill

## 2015-05-11 ENCOUNTER — Telehealth: Payer: Self-pay | Admitting: Internal Medicine

## 2015-05-11 NOTE — Telephone Encounter (Signed)
Pt called stating that she is having GI problems. And would like to make appt.. Please advise where to schedule.Marland Kitchen

## 2015-05-11 NOTE — Telephone Encounter (Signed)
I can see her at 12:30 on Wednesday (05/13/15) if able to wait until Wednesday.  Please document what type of GI issues.  If any acute problems, let me know.

## 2015-05-13 ENCOUNTER — Encounter: Payer: Self-pay | Admitting: Internal Medicine

## 2015-05-13 ENCOUNTER — Ambulatory Visit (INDEPENDENT_AMBULATORY_CARE_PROVIDER_SITE_OTHER): Payer: PPO | Admitting: Internal Medicine

## 2015-05-13 VITALS — BP 142/78 | HR 64 | Temp 98.0°F | Resp 18 | Ht 62.0 in | Wt 142.1 lb

## 2015-05-13 DIAGNOSIS — Z23 Encounter for immunization: Secondary | ICD-10-CM | POA: Diagnosis not present

## 2015-05-13 DIAGNOSIS — R109 Unspecified abdominal pain: Secondary | ICD-10-CM

## 2015-05-13 DIAGNOSIS — IMO0001 Reserved for inherently not codable concepts without codable children: Secondary | ICD-10-CM

## 2015-05-13 DIAGNOSIS — R030 Elevated blood-pressure reading, without diagnosis of hypertension: Secondary | ICD-10-CM

## 2015-05-13 LAB — HEPATIC FUNCTION PANEL
ALK PHOS: 74 U/L (ref 39–117)
ALT: 24 U/L (ref 0–35)
AST: 19 U/L (ref 0–37)
Albumin: 4.7 g/dL (ref 3.5–5.2)
BILIRUBIN DIRECT: 0.1 mg/dL (ref 0.0–0.3)
Total Bilirubin: 0.5 mg/dL (ref 0.2–1.2)
Total Protein: 6.6 g/dL (ref 6.0–8.3)

## 2015-05-13 LAB — CBC WITH DIFFERENTIAL/PLATELET
Basophils Absolute: 0.1 10*3/uL (ref 0.0–0.1)
Basophils Relative: 0.6 % (ref 0.0–3.0)
EOS PCT: 1.3 % (ref 0.0–5.0)
Eosinophils Absolute: 0.1 10*3/uL (ref 0.0–0.7)
HCT: 41.2 % (ref 36.0–46.0)
Hemoglobin: 14.1 g/dL (ref 12.0–15.0)
LYMPHS ABS: 1.3 10*3/uL (ref 0.7–4.0)
LYMPHS PCT: 14.2 % (ref 12.0–46.0)
MCHC: 34.2 g/dL (ref 30.0–36.0)
MCV: 89 fl (ref 78.0–100.0)
MONOS PCT: 5.3 % (ref 3.0–12.0)
Monocytes Absolute: 0.5 10*3/uL (ref 0.1–1.0)
NEUTROS PCT: 78.6 % — AB (ref 43.0–77.0)
Neutro Abs: 7.3 10*3/uL (ref 1.4–7.7)
Platelets: 288 10*3/uL (ref 150.0–400.0)
RBC: 4.63 Mil/uL (ref 3.87–5.11)
RDW: 12.6 % (ref 11.5–15.5)
WBC: 9.3 10*3/uL (ref 4.0–10.5)

## 2015-05-13 LAB — BASIC METABOLIC PANEL
BUN: 14 mg/dL (ref 6–23)
CHLORIDE: 104 meq/L (ref 96–112)
CO2: 31 meq/L (ref 19–32)
CREATININE: 0.65 mg/dL (ref 0.40–1.20)
Calcium: 9.9 mg/dL (ref 8.4–10.5)
GFR: 96.96 mL/min (ref >60.00)
GLUCOSE: 96 mg/dL (ref 70–99)
Potassium: 4.9 mEq/L (ref 3.5–5.1)
Sodium: 142 mEq/L (ref 135–145)

## 2015-05-13 LAB — LIPASE: Lipase: 26 U/L (ref 11.0–59.0)

## 2015-05-13 LAB — AMYLASE: Amylase: 27 U/L (ref 27–131)

## 2015-05-13 MED ORDER — ESOMEPRAZOLE MAGNESIUM 40 MG PO CPDR: 40.0000 mg | DELAYED_RELEASE_CAPSULE | Freq: Every day | ORAL | Status: DC

## 2015-05-13 NOTE — Patient Instructions (Signed)

## 2015-05-13 NOTE — Progress Notes (Signed)
Pre-visit discussion using our clinic review tool. No additional management support is needed unless otherwise documented below in the visit note.  

## 2015-05-13 NOTE — Assessment & Plan Note (Signed)
Brought in outside blood pressure readings.  Averaging 120s-130s/70s.  Follow.

## 2015-05-13 NOTE — Progress Notes (Signed)
Patient ID: [name] [name], female   DOB: May 11, 1951, 64 y.o.   MRN: [id number]   Subjective:    Patient ID: [name] [name], female    DOB: [birth year], 64 y.o.   MRN: [id number]  HPI  Patient with past history of elevated blood pressure who comes in today as a work in with concerns regarding stomach issues.  She has had intermittent episodes of upper abdominal pain and pain in her shoulders and back.  Occurs after eating greasy foods.  She will take TUMS and GasX.  May occur every 2-3 weeks and will last approximately 2-3 days.  Some occasional nausea.  No vomiting.  No bowel change.  No increased acid reflux. She does report sometimes having some difficulty swallowing - hard for food to go down.  Occasionally feels like esophageal spasm.  Has been eating and drinking.    Past Medical History  Diagnosis Date  . Adenomatous polyp 2005  . HTN (hypertension)   . Hypothyroidism   . Environmental allergies   . History of chicken pox   . Arrhythmia     H/O  . Hypercholesterolemia   . Basal cell carcinoma   . CTS (carpal tunnel syndrome)    Past Surgical History  Procedure Laterality Date  . Tubal ligation    . Cts releast rt wrist    . Lipoma removal      Rt shoulder   . Melanoma excision      right buttocks  . Basal cell carcinoma excision      right hip,abd, right arm, back neck  . Polypectomy      + TA   Family History  Problem Relation Age of Onset  . Cervical cancer Mother   . Arthritis Mother   . Hypertension Mother   . Sudden death Mother     cerbral hemorrage  . Diabetes Maternal Grandmother   . Heart disease Father   . Cancer Father     lung cancer  . Colon cancer Neg Hx   . Breast cancer Paternal Aunt    Social History   Social History  . Marital Status: Married    Spouse Name: N/A  . Number of Children: 1  . Years of Education: N/A   Occupational History  . [employer]/ [employer]     Social History Main Topics  . Smoking status: Former Smoker   Types: Cigarettes    Quit date: 07/16/1980  . Smokeless tobacco: Never Used  . Alcohol Use: 4.2 oz/week    7 Glasses of wine per week     Comment: 1 per day   . Drug Use: No  . Sexual Activity: Not Asked   Other Topics Concern  . None   Social History Narrative   Married; 1 son.    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 BIOTIN PO Take 500 mg by mouth daily.  . Calcium-Magnesium (CAL/MAG CITRATE) 250-125 MG TABS Take 1 tablet by mouth daily.    . Cholecalciferol (VITAMIN D) 2000 UNITS tablet Take 2,000 Units by mouth daily.  . clobetasol (TEMOVATE) 0.05 % external solution Apply 1 application topically daily.  . Eszopiclone 3 MG TABS TAKE ONE TABLET AT BEDTIME  . fluticasone (FLONASE) 50 MCG/ACT nasal spray USE 2 SPRAYS IN EACH NOSTRIL EVERY DAY  . Multiple Vitamin (MULTIVITAMIN) tablet Take 1 tablet by mouth daily.    Marland Kitchen  OMEGA 3 340 MG CPDR Take 1 capsule by mouth daily.    . Red Yeast Rice 600 MG TABS Take by mouth daily.  Marland Kitchen SYNTHROID 150 MCG tablet TAKE 1 TABLET EVERY DAY  . esomeprazole (NEXIUM) 40 MG capsule Take 1 capsule (40 mg total) by mouth daily.   No facility-administered encounter medications on file as of 05/13/2015.    Review of Systems  Constitutional: Negative for appetite change and unexpected weight change.  HENT: Negative for congestion and sinus pressure.   Respiratory: Negative for cough, chest tightness and shortness of breath.   Cardiovascular: Negative for chest pain, palpitations and leg swelling.  Gastrointestinal: Positive for nausea (occasional nausea. ) and abdominal pain (upper abdominal pain. ). Negative for vomiting and diarrhea.  Genitourinary: Negative for dysuria and difficulty urinating.  Musculoskeletal:       Pain radiates to back.    Skin: Negative for color change and rash.  Neurological: Negative for dizziness and headaches.        Objective:    Physical Exam  Constitutional: She appears well-developed and well-nourished. No distress.  HENT:  Nose: Nose normal.  Mouth/Throat: Oropharynx is clear and moist.  Neck: Neck supple.  Cardiovascular: Normal rate and regular rhythm.   Pulmonary/Chest: Breath sounds normal. No respiratory distress. She has no wheezes.  Abdominal: Soft. Bowel sounds are normal. There is no tenderness.  Musculoskeletal: She exhibits no edema or tenderness.  Lymphadenopathy:    She has no cervical adenopathy.  Skin: No rash noted. No erythema.    BP 142/78 mmHg  Pulse 64  Temp(Src) 98 F (36.7 C) (Oral)  Resp 18  Ht 5' 2"  (1.575 m)  Wt 142 lb 2 oz (64.467 kg)  BMI 25.99 kg/m2  SpO2 98% Wt Readings from Last 3 Encounters:  05/13/15 142 lb 2 oz (64.467 kg)  03/03/15 145 lb 8 oz (65.998 kg)  10/20/14 151 lb 2 oz (68.55 kg)     Lab Results  Component Value Date   WBC 9.3 05/13/2015   HGB 14.1 05/13/2015   HCT 41.2 05/13/2015   PLT 288.0 05/13/2015   GLUCOSE 96 05/13/2015   CHOL 187 10/20/2014   TRIG 78.0 10/20/2014   HDL 64.90 10/20/2014   LDLDIRECT 118.2 10/16/2006   LDLCALC 107* 10/20/2014   ALT 24 05/13/2015   AST 19 05/13/2015   NA 142 05/13/2015   K 4.9 05/13/2015   CL 104 05/13/2015   CREATININE 0.65 05/13/2015   BUN 14 05/13/2015   CO2 31 05/13/2015   TSH 2.01 10/20/2014       Assessment & Plan:   Problem List Items Addressed This Visit    Abdominal pain    Intermittent upper abdominal pain and pain radiating to shoulder and back.  Need to confirm no gallbladder involvement.  Check abdominal ultrasound. Also start nexium daily.  Check cbc, met b, liver panel and pancreatic enzymes.  Further w/up pending results.          Relevant Orders   US Abdomen Complete   CBC with Differential/Platelet (Completed)   Hepatic function panel (Completed)   Basic metabolic panel (Completed)   Amylase (Completed)   Lipase (Completed)   Elevated blood pressure     Brought in outside blood pressure readings.  Averaging 120s-130s/70s.  Follow.         Other Visit Diagnoses    Encounter for immunization    -  Primary        Einar Pheasant, MD

## 2015-05-13 NOTE — Assessment & Plan Note (Signed)
Intermittent upper abdominal pain and pain radiating to shoulder and back.  Need to confirm no gallbladder involvement.  Check abdominal ultrasound. Also start nexium daily.  Check cbc, met b, liver panel and pancreatic enzymes.  Further w/up pending results.

## 2015-05-14 ENCOUNTER — Encounter: Payer: Self-pay | Admitting: Internal Medicine

## 2015-05-20 ENCOUNTER — Encounter: Payer: Self-pay | Admitting: Internal Medicine

## 2015-05-22 ENCOUNTER — Ambulatory Visit
Admission: RE | Admit: 2015-05-22 | Discharge: 2015-05-22 | Disposition: A | Discharge status: Home / Self Care | Payer: PPO | Source: Ambulatory Visit | Attending: Internal Medicine | Admitting: Internal Medicine

## 2015-05-22 DIAGNOSIS — K769 Liver disease, unspecified: Secondary | ICD-10-CM | POA: Insufficient documentation

## 2015-05-22 DIAGNOSIS — R109 Unspecified abdominal pain: Secondary | ICD-10-CM | POA: Insufficient documentation

## 2015-05-23 ENCOUNTER — Encounter: Payer: Self-pay | Admitting: Internal Medicine

## 2015-05-28 ENCOUNTER — Other Ambulatory Visit: Payer: Self-pay | Admitting: Internal Medicine

## 2015-06-25 ENCOUNTER — Encounter: Payer: Self-pay | Admitting: Internal Medicine

## 2015-06-25 ENCOUNTER — Ambulatory Visit (INDEPENDENT_AMBULATORY_CARE_PROVIDER_SITE_OTHER): Payer: PPO | Admitting: Internal Medicine

## 2015-06-25 VITALS — BP 130/80 | HR 58 | Temp 98.0°F | Resp 18 | Ht 62.0 in | Wt 143.2 lb

## 2015-06-25 DIAGNOSIS — E785 Hyperlipidemia, unspecified: Secondary | ICD-10-CM | POA: Diagnosis not present

## 2015-06-25 DIAGNOSIS — IMO0001 Reserved for inherently not codable concepts without codable children: Secondary | ICD-10-CM

## 2015-06-25 DIAGNOSIS — Z23 Encounter for immunization: Secondary | ICD-10-CM | POA: Diagnosis not present

## 2015-06-25 DIAGNOSIS — G479 Sleep disorder, unspecified: Secondary | ICD-10-CM

## 2015-06-25 DIAGNOSIS — Z86010 Personal history of colonic polyps: Secondary | ICD-10-CM

## 2015-06-25 DIAGNOSIS — Z1239 Encounter for other screening for malignant neoplasm of breast: Secondary | ICD-10-CM

## 2015-06-25 DIAGNOSIS — E039 Hypothyroidism, unspecified: Secondary | ICD-10-CM | POA: Diagnosis not present

## 2015-06-25 DIAGNOSIS — R030 Elevated blood-pressure reading, without diagnosis of hypertension: Secondary | ICD-10-CM

## 2015-06-25 DIAGNOSIS — C4491 Basal cell carcinoma of skin, unspecified: Secondary | ICD-10-CM

## 2015-06-25 DIAGNOSIS — E2839 Other primary ovarian failure: Secondary | ICD-10-CM

## 2015-06-25 DIAGNOSIS — Z85828 Personal history of other malignant neoplasm of skin: Secondary | ICD-10-CM

## 2015-06-25 DIAGNOSIS — R1310 Dysphagia, unspecified: Secondary | ICD-10-CM

## 2015-06-25 DIAGNOSIS — Z0000 Encounter for general adult medical examination without abnormal findings: Secondary | ICD-10-CM

## 2015-06-25 LAB — LIPID PANEL
CHOL/HDL RATIO: 3
Cholesterol: 201 mg/dL — ABNORMAL HIGH (ref 0–200)
HDL: 72.4 mg/dL (ref >39.00)
LDL CALC: 115 mg/dL — AB (ref 0–99)
NONHDL: 128.85
TRIGLYCERIDES: 69 mg/dL (ref 0.0–149.0)
VLDL: 13.8 mg/dL (ref 0.0–40.0)

## 2015-06-25 LAB — TSH: TSH: 0.54 u[IU]/mL (ref 0.35–4.50)

## 2015-06-25 NOTE — Patient Instructions (Signed)
Zantac (ranitidine) 150mg - take one tablet 30 minutes before breakfast.   

## 2015-06-25 NOTE — Progress Notes (Signed)
Patient ID: [name] [name], female   DOB: [birth year], 64 y.o.   MRN: [id number]   Subjective:    Patient ID: [name] [name], female    DOB: [birth year], 64 y.o.   MRN: [id number]  HPI  Patient with past history of hypercholesterolemia, hypothyroidism and hypertension who comes in today to follow up on these issues as well as for a complete physical exam.  Was having GI issues last visit.  See my 05/13/15 note for details.  Started on nexium.  Abdominal ultrasound with stable hemangioma with no focal area of abnormality.  She is off nexium now.  She has been adjusting her diet.  No greasy foods or heavy foods.  Still having some swallowing issues.  Food getting stuck at times.  Some spasm with cold water.  No abdominal pain or cramping.  No urinary symptoms.  Due f/u with dermatology in 11-12/16.  Wants to see Dr Brendolyn Patty.  Stays active.  No cardiac symptoms with increased activity or exertion.  No sob.     Past Medical History  Diagnosis Date  . Adenomatous polyp 2005  . HTN (hypertension)   . Hypothyroidism   . Environmental allergies   . History of chicken pox   . Arrhythmia     H/O  . Hypercholesterolemia   . Basal cell carcinoma   . CTS (carpal tunnel syndrome)    Past Surgical History  Procedure Laterality Date  . Tubal ligation    . Cts releast rt wrist    . Lipoma removal      Rt shoulder   . Melanoma excision      right buttocks  . Basal cell carcinoma excision      right hip,abd, right arm, back neck  . Polypectomy      + TA   Family History  Problem Relation Age of Onset  . Cervical cancer Mother   . Arthritis Mother   . Hypertension Mother   . Sudden death Mother     cerbral hemorrage  . Diabetes Maternal Grandmother   . Heart disease Father   . Cancer Father     lung cancer  . Colon cancer Neg Hx   . Breast cancer Paternal Aunt    Social History   Social History  . Marital Status: Married    Spouse Name: N/A  . Number of Children: 1  . Years of  Education: N/A   Occupational History  . [employer]/ [employer]     Social History Main Topics  . Smoking status: Former Smoker    Types: Cigarettes    Quit date: 07/16/1980  . Smokeless tobacco: Never Used  . Alcohol Use: 4.2 oz/week    7 Glasses of wine per week     Comment: 1 per day   . Drug Use: No  . Sexual Activity: Not Asked   Other Topics Concern  . None   Social History Narrative   Married; 1 son.    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 BIOTIN PO Take 500 mg by mouth daily.  . Calcium-Magnesium (CAL/MAG CITRATE) 250-125 MG TABS Take 1 tablet by mouth daily.    . Cholecalciferol (VITAMIN D) 2000 UNITS tablet Take 2,000 Units by mouth daily.  . clobetasol (TEMOVATE) 0.05 % external solution Apply 1 application topically daily.  Marland Kitchen esomeprazole (NEXIUM) 40 MG capsule Take  1 capsule (40 mg total) by mouth daily.  . Eszopiclone 3 MG TABS TAKE ONE TABLET AT BEDTIME  . fluticasone (FLONASE) 50 MCG/ACT nasal spray USE 2 SPRAYS IN EACH NOSTRIL EVERY DAY  . Multiple Vitamin (MULTIVITAMIN) tablet Take 1 tablet by mouth daily.    . OMEGA 3 340 MG CPDR Take 1 capsule by mouth daily.    . Red Yeast Rice 600 MG TABS Take by mouth daily.  Marland Kitchen SYNTHROID 150 MCG tablet TAKE 1 TABLET EVERY DAY   No facility-administered encounter medications on file as of 06/25/2015.    Review of Systems  Constitutional: Negative for appetite change and unexpected weight change.  HENT: Negative for congestion and sinus pressure.   Eyes: Negative for pain and visual disturbance.  Respiratory: Negative for cough, chest tightness and shortness of breath.   Cardiovascular: Negative for chest pain, palpitations and leg swelling.  Gastrointestinal: Negative for nausea, vomiting, abdominal pain and diarrhea.  Genitourinary: Negative for dysuria and difficulty urinating.  Musculoskeletal: Negative  for back pain and joint swelling.  Skin: Negative for color change and rash.  Neurological: Negative for dizziness, light-headedness and headaches.  Hematological: Negative for adenopathy. Does not bruise/bleed easily.  Psychiatric/Behavioral: Negative for dysphoric mood and agitation.       Objective:    Physical Exam  Constitutional: She is oriented to person, place, and time. She appears well-developed and well-nourished. No distress.  HENT:  Nose: Nose normal.  Mouth/Throat: Oropharynx is clear and moist.  Eyes: Right eye exhibits no discharge. Left eye exhibits no discharge. No scleral icterus.  Neck: Neck supple. No thyromegaly present.  Cardiovascular: Normal rate and regular rhythm.   Pulmonary/Chest: Breath sounds normal. No accessory muscle usage. No tachypnea. No respiratory distress. She has no decreased breath sounds. She has no wheezes. She has no rhonchi. Right breast exhibits no inverted nipple, no mass, no nipple discharge and no tenderness (no axillary adenopathy). Left breast exhibits no inverted nipple, no mass, no nipple discharge and no tenderness (no axilarry adenopathy).  Abdominal: Soft. Bowel sounds are normal. There is no tenderness.  Musculoskeletal: She exhibits no edema or tenderness.  Lymphadenopathy:    She has no cervical adenopathy.  Neurological: She is alert and oriented to person, place, and time.  Skin: Skin is warm. No rash noted. No erythema.  Psychiatric: She has a normal mood and affect. Her behavior is normal.    BP 130/80 mmHg  Pulse 58  Temp(Src) 98 F (36.7 C) (Oral)  Resp 18  Ht 5\' 2"  (1.575 m)  Wt 143 lb 4 oz (64.978 kg)  BMI 26.19 kg/m2  SpO2 98% Wt Readings from Last 3 Encounters:  06/25/15 143 lb 4 oz (64.978 kg)  05/13/15 142 lb 2 oz (64.467 kg)  03/03/15 145 lb 8 oz (65.998 kg)     Lab Results  Component Value Date   WBC 9.3 05/13/2015   HGB 14.1 05/13/2015   HCT 41.2 05/13/2015   PLT 288.0 05/13/2015   GLUCOSE 96  05/13/2015   CHOL 201* 06/25/2015   TRIG 69.0 06/25/2015   HDL 72.40 06/25/2015   LDLDIRECT 118.2 10/16/2006   LDLCALC 115* 06/25/2015   ALT 24 05/13/2015   AST 19 05/13/2015   NA 142 05/13/2015   K 4.9 05/13/2015   CL 104 05/13/2015   CREATININE 0.65 05/13/2015   BUN 14 05/13/2015   CO2 31 05/13/2015   TSH 0.54 06/25/2015    US Abdomen Complete  05/22/2015  CLINICAL DATA:  Back and abdominal pain. EXAM: ULTRASOUND ABDOMEN COMPLETE COMPARISON:  Ultrasound 04/05/2010. FINDINGS: Gallbladder: No gallstones or wall thickening visualized. No sonographic Murphy sign noted. Common bile duct: Diameter: 1.6 mm Liver: 1 cm hyperechoic lesion is noted in the central portion right lobe of the liver. This is most likely a hemangioma. Similar finding noted on prior study. IVC: No abnormality visualized. Pancreas: Visualized portion unremarkable. Spleen: Size and appearance within normal limits. Right Kidney: Length: 11.7 cm. Echogenicity within normal limits. No mass or hydronephrosis visualized. Left Kidney: Length: 10.6 cm. Echogenicity within normal limits. No mass or hydronephrosis visualized. Abdominal aorta: No aneurysm visualized. Other findings: None. IMPRESSION: 1. Stable 1 cm hyperechoic lesion in the liver consistent with hemangioma. 2.  No acute or focal abnormality . Electronically Signed   By: Marcello Moores  Register   On: 05/22/2015 09:22       Assessment & Plan:   Problem List Items Addressed This Visit    Basal cell carcinoma    Followed by dermatology.  Wants to see Dr Brendolyn Patty.        Dysphagia    Persistent swallowing issues as outlined.  Start zantac.  Refer to GI for evaluation and question of need for EGD.        Relevant Orders   Ambulatory referral to Gastroenterology   Elevated blood pressure    Blood pressure doing well.  Follow pressures.  Follow metabolic panel.        Health care maintenance    Physical today 06/25/15.  Colonoscopy 07/2013.  Mammogram 06/20/14 -  Birads I.  Schedule f/u mammogram and schedule bone density.       History of colonic polyps    Colonoscopy 07/2013.        Hyperlipidemia    Low cholesterol diet and exercise.  Follow lipid panel.        Relevant Orders   Lipid panel (Completed)   Hypothyroidism    On thyroid replacement.  Follow tsh.        Relevant Orders   TSH (Completed)   Sleeping difficulty    Takes lunesta to help her sleep.  Follow.         Other Visit Diagnoses    History of skin cancer    -  Primary    Relevant Orders    Ambulatory referral to Dermatology    Breast cancer screening        Relevant Orders    MM DIGITAL SCREENING BILATERAL    Estrogen deficiency        Relevant Orders    DG Bone Density    Need for prophylactic vaccination against Streptococcus pneumoniae (pneumococcus)        Relevant Orders    Pneumococcal polysaccharide vaccine 23-valent greater than or equal to 2yo subcutaneous/IM (Completed)        Einar Pheasant, MD

## 2015-06-25 NOTE — Progress Notes (Signed)
Pre-visit discussion using our clinic review tool. No additional management support is needed unless otherwise documented below in the visit note.  

## 2015-06-26 ENCOUNTER — Encounter: Payer: Self-pay | Admitting: Internal Medicine

## 2015-06-28 ENCOUNTER — Encounter: Payer: Self-pay | Admitting: Internal Medicine

## 2015-06-28 DIAGNOSIS — R1310 Dysphagia, unspecified: Secondary | ICD-10-CM | POA: Insufficient documentation

## 2015-06-28 NOTE — Assessment & Plan Note (Signed)
Physical today 06/25/15.  Colonoscopy 07/2013.  Mammogram 06/20/14 - Birads I.  Schedule f/u mammogram and schedule bone density.

## 2015-06-28 NOTE — Assessment & Plan Note (Signed)
Low cholesterol diet and exercise.  Follow lipid panel.   

## 2015-06-28 NOTE — Assessment & Plan Note (Signed)
Takes lunesta to help her sleep.  Follow.

## 2015-06-28 NOTE — Assessment & Plan Note (Signed)
Colonoscopy 07/2013.   

## 2015-06-28 NOTE — Assessment & Plan Note (Signed)
On thyroid replacement.  Follow tsh.  

## 2015-06-28 NOTE — Assessment & Plan Note (Signed)
Followed by dermatology.  Wants to see Dr Brendolyn Patty.

## 2015-06-28 NOTE — Assessment & Plan Note (Signed)
Blood pressure doing well.  Follow pressures.  Follow metabolic panel.  

## 2015-06-28 NOTE — Assessment & Plan Note (Signed)
Persistent swallowing issues as outlined.  Start zantac.  Refer to GI for evaluation and question of need for EGD.

## 2015-07-01 ENCOUNTER — Encounter: Payer: Self-pay | Admitting: Internal Medicine

## 2015-07-14 ENCOUNTER — Ambulatory Visit
Admission: RE | Admit: 2015-07-14 | Discharge: 2015-07-14 | Disposition: A | Discharge status: Home / Self Care | Payer: PPO | Source: Ambulatory Visit | Attending: Internal Medicine | Admitting: Internal Medicine

## 2015-07-14 DIAGNOSIS — E2839 Other primary ovarian failure: Secondary | ICD-10-CM

## 2015-07-14 DIAGNOSIS — Z13820 Encounter for screening for osteoporosis: Secondary | ICD-10-CM | POA: Diagnosis present

## 2015-07-14 DIAGNOSIS — Z1231 Encounter for screening mammogram for malignant neoplasm of breast: Secondary | ICD-10-CM | POA: Diagnosis not present

## 2015-07-14 DIAGNOSIS — M8580 Other specified disorders of bone density and structure, unspecified site: Secondary | ICD-10-CM | POA: Insufficient documentation

## 2015-07-14 DIAGNOSIS — Z780 Asymptomatic menopausal state: Secondary | ICD-10-CM | POA: Insufficient documentation

## 2015-07-14 DIAGNOSIS — Z1239 Encounter for other screening for malignant neoplasm of breast: Secondary | ICD-10-CM

## 2015-07-15 ENCOUNTER — Encounter: Payer: Self-pay | Admitting: Internal Medicine

## 2015-08-04 ENCOUNTER — Other Ambulatory Visit: Payer: Self-pay | Admitting: Physician Assistant

## 2015-08-04 DIAGNOSIS — R1319 Other dysphagia: Secondary | ICD-10-CM

## 2015-08-12 ENCOUNTER — Ambulatory Visit
Admission: RE | Admit: 2015-08-12 | Discharge: 2015-08-12 | Disposition: A | Discharge status: Home / Self Care | Payer: PPO | Source: Ambulatory Visit | Attending: Physician Assistant | Admitting: Physician Assistant

## 2015-08-12 DIAGNOSIS — R1319 Other dysphagia: Secondary | ICD-10-CM | POA: Diagnosis not present

## 2015-08-12 DIAGNOSIS — K449 Diaphragmatic hernia without obstruction or gangrene: Secondary | ICD-10-CM | POA: Insufficient documentation

## 2015-08-21 ENCOUNTER — Ambulatory Visit (INDEPENDENT_AMBULATORY_CARE_PROVIDER_SITE_OTHER): Payer: PPO | Admitting: Internal Medicine

## 2015-08-21 ENCOUNTER — Encounter: Payer: Self-pay | Admitting: Internal Medicine

## 2015-08-21 ENCOUNTER — Telehealth: Payer: Self-pay | Admitting: Internal Medicine

## 2015-08-21 VITALS — BP 154/80 | HR 75 | Temp 98.8°F | Ht 62.0 in | Wt 141.8 lb

## 2015-08-21 DIAGNOSIS — F419 Anxiety disorder, unspecified: Secondary | ICD-10-CM | POA: Diagnosis not present

## 2015-08-21 DIAGNOSIS — R1310 Dysphagia, unspecified: Secondary | ICD-10-CM

## 2015-08-21 DIAGNOSIS — G479 Sleep disorder, unspecified: Secondary | ICD-10-CM

## 2015-08-21 MED ORDER — ESOMEPRAZOLE MAGNESIUM 40 MG PO CPDR: 40.0000 mg | DELAYED_RELEASE_CAPSULE | Freq: Every day | ORAL | Status: DC

## 2015-08-21 MED ORDER — LORAZEPAM 0.5 MG PO TABS: ORAL_TABLET | ORAL | Status: DC

## 2015-08-21 NOTE — Progress Notes (Signed)
Patient ID: [name] [name], female   DOB: 10/10/1950, 65 y.o.   MRN: [id number]   Subjective:    Patient ID: [name] [name], female    DOB: May 10, 1951, 65 y.o.   MRN: [id number]  HPI  Patient with past history of hypercholesterolemia and hypertension.  She comes in today as a work in with concerns regarding increased stress and inability to sleep.  She recently found out that her son is separating from his wife.  She is worried about this.  Concerned about that situation and about her granddaughter.  Her son and his wife have been to counseling.  They are talking and working together - in dealing with the granddaughter.  She states she feels anxious.  Has some days that are better.  States when she lies down at night, mind does not shut down.  Not sleeping.  Takes lunesta.  Increased her dose of lunesta.  Did not help.  Is eating.  Did not tolerate protonix.  Saw GI.  They changed her to protonix bid.  Has f/u planned with GI.  Felt nexium worked better.     Past Medical History  Diagnosis Date  . Adenomatous polyp 2005  . HTN (hypertension)   . Hypothyroidism   . Environmental allergies   . History of chicken pox   . Arrhythmia     H/O  . Hypercholesterolemia   . CTS (carpal tunnel syndrome)   . Basal cell carcinoma    Past Surgical History  Procedure Laterality Date  . Tubal ligation    . Cts releast rt wrist    . Lipoma removal      Rt shoulder   . Melanoma excision      right buttocks  . Basal cell carcinoma excision      right hip,abd, right arm, back neck  . Polypectomy      + TA  . Breast cyst aspiration Right     negative   Family History  Problem Relation Age of Onset  . Cervical cancer Mother   . Arthritis Mother   . Hypertension Mother   . Sudden death Mother     cerbral hemorrage  . Diabetes Maternal Grandmother   . Heart disease Father   . Cancer Father     lung cancer  . Colon cancer Neg Hx   . Breast cancer Paternal Aunt 54   Social History    Social History  . Marital Status: Married    Spouse Name: N/A  . Number of Children: 1  . Years of Education: N/A   Occupational History  . [employer]/ [employer]     Social History Main Topics  . Smoking status: Former Smoker    Types: Cigarettes    Quit date: 07/16/1980  . Smokeless tobacco: Never Used  . Alcohol Use: 4.2 oz/week    7 Glasses of wine per week     Comment: 1 per day   . Drug Use: No  . Sexual Activity: Not Asked   Other Topics Concern  . None   Social History Narrative   Married; 1 son.    Dental assistant   Daily caffeine use 2-3/day       Cell # [id number]    Outpatient Encounter Prescriptions as of 08/21/2015  Medication Sig  . BIOTIN PO Take 500 mg by mouth daily.  . Calcium-Magnesium (CAL/MAG CITRATE) 250-125 MG TABS Take 1 tablet by mouth daily.    . Cholecalciferol (VITAMIN D) 2000 UNITS tablet Take 2,000  Units by mouth daily.  . clobetasol (TEMOVATE) 0.05 % external solution Apply 1 application topically daily.  . Eszopiclone 3 MG TABS TAKE ONE TABLET AT BEDTIME  . fluticasone (FLONASE) 50 MCG/ACT nasal spray USE 2 SPRAYS IN EACH NOSTRIL EVERY DAY  . Multiple Vitamin (MULTIVITAMIN) tablet Take 1 tablet by mouth daily.    . OMEGA 3 340 MG CPDR Take 1 capsule by mouth daily.    . Red Yeast Rice 600 MG TABS Take by mouth daily.  Marland Kitchen SYNTHROID 150 MCG tablet TAKE 1 TABLET EVERY DAY  . [DISCONTINUED] pantoprazole (PROTONIX) 40 MG tablet Take by mouth.  . esomeprazole (NEXIUM) 40 MG capsule Take 1 capsule (40 mg total) by mouth daily.  Marland Kitchen LORazepam (ATIVAN) 0.5 MG tablet Take 1/2-1 tablet bid prn  . [DISCONTINUED] esomeprazole (NEXIUM) 40 MG capsule Take 1 capsule (40 mg total) by mouth daily. (Patient not taking: Reported on 08/21/2015)   No facility-administered encounter medications on file as of 08/21/2015.    Review of Systems  Constitutional: Positive for fatigue. Negative for fever.  Respiratory: Negative for cough, chest tightness and  shortness of breath.   Cardiovascular: Negative for chest pain and palpitations.  Gastrointestinal: Negative for nausea, vomiting, abdominal pain and diarrhea.  Neurological: Negative for dizziness and headaches.  Psychiatric/Behavioral:       Increased stress and anxiety as outlined.  Not sleeping.       Objective:    Physical Exam  Constitutional: She appears well-developed and well-nourished. No distress.  physical not performed today given nature of this visit.    BP 154/80 mmHg  Pulse 75  Temp(Src) 98.8 F (37.1 C) (Oral)  Ht 5\' 2"  (1.575 m)  Wt 141 lb 12 oz (64.297 kg)  BMI 25.92 kg/m2  SpO2 98% Wt Readings from Last 3 Encounters:  08/21/15 141 lb 12 oz (64.297 kg)  06/25/15 143 lb 4 oz (64.978 kg)  05/13/15 142 lb 2 oz (64.467 kg)     Lab Results  Component Value Date   WBC 9.3 05/13/2015   HGB 14.1 05/13/2015   HCT 41.2 05/13/2015   PLT 288.0 05/13/2015   GLUCOSE 96 05/13/2015   CHOL 201* 06/25/2015   TRIG 69.0 06/25/2015   HDL 72.40 06/25/2015   LDLDIRECT 118.2 10/16/2006   LDLCALC 115* 06/25/2015   ALT 24 05/13/2015   AST 19 05/13/2015   NA 142 05/13/2015   K 4.9 05/13/2015   CL 104 05/13/2015   CREATININE 0.65 05/13/2015   BUN 14 05/13/2015   CO2 31 05/13/2015   TSH 0.54 06/25/2015    Dg Esophagus  08/12/2015  CLINICAL DATA:  Dysphagia. EXAM: ESOPHOGRAM / BARIUM SWALLOW / BARIUM TABLET STUDY TECHNIQUE: Combined double contrast and single contrast examination performed using effervescent crystals, thick barium liquid, and thin barium liquid. The patient was observed with fluoroscopy swallowing a 13 mm barium sulphate tablet. FLUOROSCOPY TIME:  Radiation Exposure Index (as provided by the fluoroscopic device): 31.9 mGy COMPARISON:  None. FINDINGS: Cervical esophagus is unremarkable. Tertiary esophageal contractions are noted. Small sliding hiatal hernia with small B ring noted. Standardized barium tablet passed normally. No reflux. IMPRESSION: Small  sliding hiatal hernia with small B ring. No evidence of obstruction. Standardized barium tab tablet passed normally. No reflux. 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 Electronically Signed   By: Marcello Moores  Register   On: 08/12/2015 10:31       Assessment & Plan:   Problem List Items Addressed This Visit    Anxiety  Lorazepam as outlined.  Follow.  See above.       Relevant Medications   LORazepam (ATIVAN) 0.5 MG tablet   Dysphagia - Primary    Seeing GI.  Esophagus test as outlined.  Change to nexium in am and zantac in pm.  Follow.  Keep f/u with GI.        Sleeping difficulty    On lunesta.  Not working.  Increased stress and anxiety as outlined.  Mind not shutting down.  Add lorazepam as directed.  Space out from [place].  Start with 1/2 tablet.  Follow closely.  Get her back in soon to reassess.  She has good support.  Does not feel needs anything more at this time.           Einar Pheasant, MD

## 2015-08-21 NOTE — Telephone Encounter (Signed)
Please advise 

## 2015-08-21 NOTE — Telephone Encounter (Signed)
Scheduled and I will come room her if needed.

## 2015-08-21 NOTE — Telephone Encounter (Signed)
I can work her in today at 1:00, but will need someone to room her.  Thanks

## 2015-08-21 NOTE — Telephone Encounter (Signed)
Pt called about having some not so good news regarding a family member that's really upsetting her. Pt has not been able to sleep she is taking  Eszopiclone 3 MG TABS and she states it is not working. Pt would like something else but not sure what she just needs to sleep. Does Dr Nicki Reaper want to see pt? Pharmacy is [facility], [place] [address] Thank You! Call home [phone] and or cell [phone].

## 2015-08-21 NOTE — Patient Instructions (Signed)
Zantac (ranitidine) 150mg - take one tablet 30 minutes before your evening meal 

## 2015-08-23 ENCOUNTER — Encounter: Payer: Self-pay | Admitting: Internal Medicine

## 2015-08-23 DIAGNOSIS — F419 Anxiety disorder, unspecified: Secondary | ICD-10-CM | POA: Insufficient documentation

## 2015-08-23 NOTE — Assessment & Plan Note (Signed)
On lunesta.  Not working.  Increased stress and anxiety as outlined.  Mind not shutting down.  Add lorazepam as directed.  Space out from [place].  Start with 1/2 tablet.  Follow closely.  Get her back in soon to reassess.  She has good support.  Does not feel needs anything more at this time.

## 2015-08-23 NOTE — Assessment & Plan Note (Signed)
Seeing GI.  Esophagus test as outlined.  Change to nexium in am and zantac in pm.  Follow.  Keep f/u with GI.

## 2015-08-23 NOTE — Assessment & Plan Note (Signed)
Lorazepam as outlined.  Follow.  See above.

## 2015-08-25 ENCOUNTER — Encounter: Payer: Self-pay | Admitting: Internal Medicine

## 2015-08-31 ENCOUNTER — Ambulatory Visit: Admission: RE | Admit: 2015-08-31 | Payer: PPO | Source: Ambulatory Visit | Admitting: Unknown Physician Specialty

## 2015-08-31 ENCOUNTER — Encounter: Admission: RE | Payer: Self-pay | Source: Ambulatory Visit

## 2015-08-31 SURGERY — ESOPHAGOGASTRODUODENOSCOPY (EGD) WITH PROPOFOL
Anesthesia: General

## 2015-09-07 DIAGNOSIS — L659 Nonscarring hair loss, unspecified: Secondary | ICD-10-CM | POA: Diagnosis not present

## 2015-09-07 DIAGNOSIS — C44519 Basal cell carcinoma of skin of other part of trunk: Secondary | ICD-10-CM | POA: Diagnosis not present

## 2015-09-15 DIAGNOSIS — H2513 Age-related nuclear cataract, bilateral: Secondary | ICD-10-CM | POA: Diagnosis not present

## 2015-09-17 DIAGNOSIS — K219 Gastro-esophageal reflux disease without esophagitis: Secondary | ICD-10-CM | POA: Diagnosis not present

## 2015-09-17 DIAGNOSIS — R1319 Other dysphagia: Secondary | ICD-10-CM | POA: Diagnosis not present

## 2015-09-29 DIAGNOSIS — L659 Nonscarring hair loss, unspecified: Secondary | ICD-10-CM | POA: Diagnosis not present

## 2015-10-07 ENCOUNTER — Other Ambulatory Visit: Payer: Self-pay | Admitting: Internal Medicine

## 2015-10-08 ENCOUNTER — Encounter: Payer: Self-pay | Admitting: Internal Medicine

## 2015-10-08 ENCOUNTER — Other Ambulatory Visit: Payer: Self-pay | Admitting: Internal Medicine

## 2015-10-09 ENCOUNTER — Other Ambulatory Visit: Payer: Self-pay | Admitting: Internal Medicine

## 2015-10-09 NOTE — Telephone Encounter (Signed)
Pt lvm stating that she needs a refill of her lunesta sent to [place].

## 2015-10-09 NOTE — Telephone Encounter (Signed)
ok'd refill for lunesta #30 with one refill.  Pt sent my chart message.

## 2015-10-13 ENCOUNTER — Ambulatory Visit (INDEPENDENT_AMBULATORY_CARE_PROVIDER_SITE_OTHER): Payer: PPO | Admitting: Internal Medicine

## 2015-10-13 ENCOUNTER — Encounter: Payer: Self-pay | Admitting: Internal Medicine

## 2015-10-13 VITALS — BP 136/80 | HR 69 | Temp 98.5°F | Resp 18 | Ht 62.0 in | Wt 143.5 lb

## 2015-10-13 DIAGNOSIS — E039 Hypothyroidism, unspecified: Secondary | ICD-10-CM

## 2015-10-13 DIAGNOSIS — F419 Anxiety disorder, unspecified: Secondary | ICD-10-CM

## 2015-10-13 DIAGNOSIS — R1310 Dysphagia, unspecified: Secondary | ICD-10-CM

## 2015-10-13 DIAGNOSIS — G479 Sleep disorder, unspecified: Secondary | ICD-10-CM | POA: Diagnosis not present

## 2015-10-13 DIAGNOSIS — Z1159 Encounter for screening for other viral diseases: Secondary | ICD-10-CM

## 2015-10-13 DIAGNOSIS — R030 Elevated blood-pressure reading, without diagnosis of hypertension: Secondary | ICD-10-CM

## 2015-10-13 DIAGNOSIS — E785 Hyperlipidemia, unspecified: Secondary | ICD-10-CM

## 2015-10-13 DIAGNOSIS — R002 Palpitations: Secondary | ICD-10-CM

## 2015-10-13 DIAGNOSIS — IMO0001 Reserved for inherently not codable concepts without codable children: Secondary | ICD-10-CM

## 2015-10-13 NOTE — Progress Notes (Signed)
Pre-visit discussion using our clinic review tool. No additional management support is needed unless otherwise documented below in the visit note.  

## 2015-10-13 NOTE — Progress Notes (Signed)
Patient ID: [name] [name], female   DOB: 15-Nov-1950, 65 y.o.   MRN: [id number]   Subjective:    Patient ID: [name] [name], female    DOB: Dec 27, 1950, 65 y.o.   MRN: [id number]  HPI  Patient with past history of hypercholesterolemia, hypothyroidism and hypertension.  She comes in today for a scheduled follow up.  She is seeing the eye doctor.  Pressure elevated.  Planning to f/u in May.  No headache.  No dizziness.  No chest pain or tightness.  Some occasional palpitations.  Has been worked up previously.  Has noticed again and more recently.  Not a significant issue for her.  Desires no further intervention.  Wants to monitor.  No syncope or near syncope.  No nausea or vomiting.  Bowels stable.     Past Medical History  Diagnosis Date  . Adenomatous polyp 2005  . HTN (hypertension)   . Hypothyroidism   . Environmental allergies   . History of chicken pox   . Arrhythmia     H/O  . Hypercholesterolemia   . CTS (carpal tunnel syndrome)   . Basal cell carcinoma    Past Surgical History  Procedure Laterality Date  . Tubal ligation    . Cts releast rt wrist    . Lipoma removal      Rt shoulder   . Melanoma excision      right buttocks  . Basal cell carcinoma excision      right hip,abd, right arm, back neck  . Polypectomy      + TA  . Breast cyst aspiration Right     negative   Family History  Problem Relation Age of Onset  . Cervical cancer Mother   . Arthritis Mother   . Hypertension Mother   . Sudden death Mother     cerbral hemorrage  . Diabetes Maternal Grandmother   . Heart disease Father   . Cancer Father     lung cancer  . Colon cancer Neg Hx   . Breast cancer Paternal Aunt 59   Social History   Social History  . Marital Status: Married    Spouse Name: N/A  . Number of Children: 1  . Years of Education: N/A   Occupational History  . [employer]uli[employer]/ [employer]o     Social History Main Topics  . Smoking status: Former Smoker    Types: Cigarettes   Quit date: 07/16/1980  . Smokeless tobacco: Never Used  . Alcohol Use: 4.2 oz/week    7 Glasses of wine per week     Comment: 1 per day   . Drug Use: No  . Sexual Activity: Not Asked   Other Topics Concern  . None   Social History Narrative   Married; 1 son.    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 BIOTIN PO Take 500 mg by mouth daily.  . Calcium-Magnesium (CAL/MAG CITRATE) 250-125 MG TABS Take 1 tablet by mouth daily.    . Cholecalciferol (VITAMIN D) 2000 UNITS tablet Take 2,000 Units by mouth daily.  . clobetasol (TEMOVATE) 0.05 % external solution Apply 1 application topically daily.  Marland Kitchen esomeprazole (NEXIUM) 40 MG capsule Take 1 capsule (40 mg total) by mouth daily.  . Eszopiclone 3 MG TABS TAKE ONE TABLET AT BEDTIME  . fluticasone (FLONASE) 50 MCG/ACT nasal spray USE 2 SPRAYS IN  EACH NOSTRIL EVERY DAY  . LORazepam (ATIVAN) 0.5 MG tablet Take 1/2-1 tablet bid prn  . Multiple Vitamin (MULTIVITAMIN) tablet Take 1 tablet by mouth daily.    . OMEGA 3 340 MG CPDR Take 1 capsule by mouth daily.    . ranitidine (ZANTAC) 150 MG tablet Take by mouth.  . Red Yeast Rice 600 MG TABS Take by mouth daily.  Marland Kitchen SYNTHROID 150 MCG tablet TAKE 1 TABLET EVERY DAY   No facility-administered encounter medications on file as of 10/13/2015.    Review of Systems  Constitutional: Negative for appetite change and unexpected weight change.  HENT: Negative for congestion and sinus pressure.   Respiratory: Negative for cough, chest tightness and shortness of breath.   Cardiovascular: Negative for chest pain, palpitations and leg swelling.  Gastrointestinal: Negative for nausea, vomiting, abdominal pain and diarrhea.  Genitourinary: Negative for dysuria and difficulty urinating.  Musculoskeletal: Negative for back pain and joint swelling.  Skin: Negative for color change and rash.  Neurological:  Negative for dizziness, light-headedness and headaches.  Psychiatric/Behavioral: Negative for dysphoric mood and agitation.       Doing better with the increased stress.         Objective:    Physical Exam  Constitutional: She appears well-developed and well-nourished. No distress.  HENT:  Nose: Nose normal.  Mouth/Throat: Oropharynx is clear and moist.  Neck: Neck supple. No thyromegaly present.  Cardiovascular: Normal rate and regular rhythm.   Pulmonary/Chest: Breath sounds normal. No respiratory distress. She has no wheezes.  Abdominal: Soft. Bowel sounds are normal. There is no tenderness.  Musculoskeletal: She exhibits no edema or tenderness.  Lymphadenopathy:    She has no cervical adenopathy.  Skin: No rash noted. No erythema.  Psychiatric: She has a normal mood and affect. Her behavior is normal.    BP 136/80 mmHg  Pulse 69  Temp(Src) 98.5 F (36.9 C) (Oral)  Resp 18  Ht 5\' 2"  (1.575 m)  Wt 143 lb 8 oz (65.091 kg)  BMI 26.24 kg/m2  SpO2 98% Wt Readings from Last 3 Encounters:  10/13/15 143 lb 8 oz (65.091 kg)  08/21/15 141 lb 12 oz (64.297 kg)  06/25/15 143 lb 4 oz (64.978 kg)     Lab Results  Component Value Date   WBC 9.3 05/13/2015   HGB 14.1 05/13/2015   HCT 41.2 05/13/2015   PLT 288.0 05/13/2015   GLUCOSE 96 05/13/2015   CHOL 201* 06/25/2015   TRIG 69.0 06/25/2015   HDL 72.40 06/25/2015   LDLDIRECT 118.2 10/16/2006   LDLCALC 115* 06/25/2015   ALT 24 05/13/2015   AST 19 05/13/2015   NA 142 05/13/2015   K 4.9 05/13/2015   CL 104 05/13/2015   CREATININE 0.65 05/13/2015   BUN 14 05/13/2015   CO2 31 05/13/2015   TSH 0.54 06/25/2015    Dg Esophagus  08/12/2015  CLINICAL DATA:  Dysphagia. EXAM: ESOPHOGRAM / BARIUM SWALLOW / BARIUM TABLET STUDY TECHNIQUE: Combined double contrast and single contrast examination performed using effervescent crystals, thick barium liquid, and thin barium liquid. The patient was observed with fluoroscopy swallowing  a 13 mm barium sulphate tablet. FLUOROSCOPY TIME:  Radiation Exposure Index (as provided by the fluoroscopic device): 31.9 mGy COMPARISON:  None. FINDINGS: Cervical esophagus is unremarkable. Tertiary esophageal contractions are noted. Small sliding hiatal hernia with small B ring noted. Standardized barium tablet passed normally. No reflux. IMPRESSION: Small sliding hiatal hernia with small B ring. No evidence of obstruction. Standardized barium tab  tablet passed normally. No reflux. 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 Electronically Signed   By: Marcello Moores  Register   On: 08/12/2015 10:31       Assessment & Plan:   Problem List Items Addressed This Visit    Anxiety    Doing better.  Not using lorazepam.  Follow.        Dysphagia - Primary    Seeing GI.  Esophagus test as outlined.  No problems reported today.  Follow.        Elevated blood pressure    Blood pressure as outlined.  She spot checks her pressure.  Under reasonable control.  Follow.       Relevant Orders   Basic metabolic panel   Heart palpitations    Was evaluated by cardiology.  Has noticed recently.  No change.  Desires no further intervention.  Follow.        Hyperlipidemia    She is dieting and exercising.  Follow lipid panel.        Relevant Orders   Lipid panel   Hepatic function panel   Hypothyroidism    On thyroid replacement.  Follow tsh.        Sleeping difficulty    Doing better.  Takes lunesta.  Follow.         Other Visit Diagnoses    Need for hepatitis C screening test        Relevant Orders    Hepatitis C antibody        Einar Pheasant, MD

## 2015-10-18 ENCOUNTER — Encounter: Payer: Self-pay | Admitting: Internal Medicine

## 2015-10-18 NOTE — Assessment & Plan Note (Signed)
Doing better.  Takes lunesta.  Follow.

## 2015-10-18 NOTE — Assessment & Plan Note (Signed)
On thyroid replacement.  Follow tsh.  

## 2015-10-18 NOTE — Assessment & Plan Note (Signed)
Was evaluated by cardiology.  Has noticed recently.  No change.  Desires no further intervention.  Follow.

## 2015-10-18 NOTE — Assessment & Plan Note (Signed)
Seeing GI.  Esophagus test as outlined.  No problems reported today.  Follow.

## 2015-10-18 NOTE — Assessment & Plan Note (Signed)
She is dieting and exercising.  Follow lipid panel.

## 2015-10-18 NOTE — Assessment & Plan Note (Signed)
Blood pressure as outlined.  She spot checks her pressure.  Under reasonable control.  Follow.

## 2015-10-18 NOTE — Assessment & Plan Note (Signed)
Doing better.  Not using lorazepam.  Follow.

## 2015-10-20 ENCOUNTER — Ambulatory Visit (INDEPENDENT_AMBULATORY_CARE_PROVIDER_SITE_OTHER): Payer: PPO | Admitting: Internal Medicine

## 2015-10-20 ENCOUNTER — Encounter: Payer: Self-pay | Admitting: Internal Medicine

## 2015-10-20 VITALS — BP 118/74 | HR 59 | Temp 98.3°F | Ht 62.0 in | Wt 141.6 lb

## 2015-10-20 DIAGNOSIS — Z0000 Encounter for general adult medical examination without abnormal findings: Secondary | ICD-10-CM | POA: Diagnosis not present

## 2015-10-20 NOTE — Progress Notes (Signed)
Patient ID: [name] [name], female   DOB: 07-Jul-1951, 65 y.o.   MRN: [id number] The patient is here for Medicare wellness examination and management of other chronic and acute problems.   The risk factors are reflected in the social history.  The roster of all physicians providing medical care to patient - Dr Jacques Earthly - opthalmology and Dr Lynelle Smoke - dentist.    Activities of daily living:  The patient is 100% independent in all ADLs: dressing, toileting, feeding as well as independent mobility  Home safety : The patient has smoke detectors in the home. She wears seatbelts.  There are firearms at home. There is no violence in the home.   There is no risks for hepatitis, STDs or HIV. There is no   history of blood transfusion. They have no travel history to infectious disease endemic areas of the world.  The patient has seen her dentist in the last six month. She has seen her eye doctor in the last year.  No hearing problems reported.    She does not  have excessive sun exposure.  Diet: the importance of a healthy diet is discussed. She does have a healthy diet.  The benefits of regular aerobic exercise were discussed.   Depression screen: there are no signs or vegative symptoms of depression- irritability, change in appetite, anhedonia, sadness/tearfullness.  Cognitive assessment: the patient manages all her financial and personal affairs and is actively engaged. She could relate day,date,year and events; recalled 3/3 objects at 3 minutes; performed clock-face test normally.  The following portions of the patient's history were reviewed and updated as appropriate: allergies, current medications, past family history, past medical history,  past surgical history, past social history  and problem list.  Visual acuity was not assessed since she has regular follow up with her ophthalmologist. Hearing and body mass index were assessed and reviewed.   During the course of the visit the patient was  educated and counseled about appropriate screening and preventive services including : fall prevention , diabetes screening, nutrition counseling, colorectal cancer screening, and recommended immunizations.  She is up to date with immunizations.    Discussed living will and obtaining health care power of attorney.  She has living will and has health care power of attorney.  Family is aware of her desires and wishes.

## 2015-10-20 NOTE — Progress Notes (Signed)
Pre visit review using our clinic review tool, if applicable. No additional management support is needed unless otherwise documented below in the visit note. 

## 2015-10-25 ENCOUNTER — Other Ambulatory Visit: Payer: Self-pay | Admitting: Internal Medicine

## 2015-10-25 DIAGNOSIS — I7300 Raynaud's syndrome without gangrene: Secondary | ICD-10-CM

## 2015-10-25 NOTE — Progress Notes (Signed)
Order placed for labs.

## 2015-11-04 ENCOUNTER — Encounter: Payer: Self-pay | Admitting: Internal Medicine

## 2015-11-04 DIAGNOSIS — H40053 Ocular hypertension, bilateral: Secondary | ICD-10-CM | POA: Diagnosis not present

## 2015-11-06 NOTE — Telephone Encounter (Signed)
She does need to be seen.  Please call her and let her know that I would like to see her since pressure is increased.  See if she can come in 11/09/15 at 1:30.  Thanks

## 2015-11-09 ENCOUNTER — Ambulatory Visit (INDEPENDENT_AMBULATORY_CARE_PROVIDER_SITE_OTHER): Payer: PPO | Admitting: Internal Medicine

## 2015-11-09 ENCOUNTER — Encounter: Payer: Self-pay | Admitting: Internal Medicine

## 2015-11-09 VITALS — BP 120/80 | HR 68 | Temp 98.3°F | Resp 18 | Ht 62.0 in | Wt 142.0 lb

## 2015-11-09 DIAGNOSIS — I10 Essential (primary) hypertension: Secondary | ICD-10-CM | POA: Diagnosis not present

## 2015-11-09 MED ORDER — LISINOPRIL 10 MG PO TABS: 10.0000 mg | ORAL_TABLET | Freq: Every day | ORAL | Status: DC

## 2015-11-09 NOTE — Progress Notes (Signed)
Patient ID: [name] [name], female   DOB: 02/23/1951, 65 y.o.   MRN: [id number]   Subjective:    Patient ID: [name] [name], female    DOB: March 09, 1951, 65 y.o.   MRN: [id number]  HPI  Patient here as a work in to discuss her blood pressure.  Blood pressure has been elevated.  Recent blood pressure checks - 140-150s/70s.  She stays active.  No cardiac symptoms with increased activity or exertion.  No sob.  No abdominal pain or cramping.  Bowels stable.  Some headache intermittently.  Mild.  No severe headache.  Had eyes checked.  Had visual field checked.  Increased pressure.     Past Medical History  Diagnosis Date  . Adenomatous polyp 2005  . HTN (hypertension)   . Hypothyroidism   . Environmental allergies   . History of chicken pox   . Arrhythmia     H/O  . Hypercholesterolemia   . CTS (carpal tunnel syndrome)   . Basal cell carcinoma    Past Surgical History  Procedure Laterality Date  . Tubal ligation    . Cts releast rt wrist    . Lipoma removal      Rt shoulder   . Melanoma excision      right buttocks  . Basal cell carcinoma excision      right hip,abd, right arm, back neck  . Polypectomy      + TA  . Breast cyst aspiration Right     negative   Family History  Problem Relation Age of Onset  . Cervical cancer Mother   . Arthritis Mother   . Hypertension Mother   . Sudden death Mother     cerbral hemorrage  . Diabetes Maternal Grandmother   . Heart disease Father   . Cancer Father     lung cancer  . Colon cancer Neg Hx   . Breast cancer Paternal Aunt 1   Social History   Social History  . Marital Status: Married    Spouse Name: N/A  . Number of Children: 1  . Years of Education: N/A   Occupational History  . [employer]     Social History Main Topics  . Smoking status: Former Smoker    Types: Cigarettes    Quit date: 07/16/1980  . Smokeless tobacco: Never Used  . Alcohol Use: 4.2 oz/week    7 Glasses of wine per week   Comment: 1 per day   . Drug Use: No  . Sexual Activity: Not Asked   Other Topics Concern  . None   Social History Narrative   Married; 1 son.    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 BIOTIN PO Take 500 mg by mouth daily.  . Calcium-Magnesium (CAL/MAG CITRATE) 250-125 MG TABS Take 1 tablet by mouth daily.    . Cholecalciferol (VITAMIN D) 2000 UNITS tablet Take 2,000 Units by mouth daily.  . clobetasol (TEMOVATE) 0.05 % external solution Apply 1 application topically daily.  Marland Kitchen esomeprazole (NEXIUM) 40 MG capsule Take 1 capsule (40 mg total) by mouth daily.  . Eszopiclone 3 MG TABS TAKE ONE TABLET AT BEDTIME  . fluticasone (FLONASE) 50 MCG/ACT nasal spray USE 2 SPRAYS IN EACH NOSTRIL EVERY DAY  . LORazepam (ATIVAN) 0.5 MG tablet Take 1/2-1 tablet bid prn  . Multiple Vitamin (MULTIVITAMIN) tablet Take 1  tablet by mouth daily.    . OMEGA 3 340 MG CPDR Take 1 capsule by mouth daily.    . ranitidine (ZANTAC) 150 MG tablet Take by mouth.  . Red Yeast Rice 600 MG TABS Take by mouth daily.  Marland Kitchen SYNTHROID 150 MCG tablet TAKE 1 TABLET EVERY DAY  . lisinopril (PRINIVIL,ZESTRIL) 10 MG tablet Take 1 tablet (10 mg total) by mouth daily.   No facility-administered encounter medications on file as of 11/09/2015.    Review of Systems  Constitutional: Negative for appetite change and unexpected weight change.  HENT: Negative for congestion and sinus pressure.   Respiratory: Negative for cough, chest tightness and shortness of breath.   Cardiovascular: Negative for chest pain, palpitations and leg swelling.  Gastrointestinal: Negative for abdominal pain.  Neurological: Positive for headaches. Negative for dizziness and light-headedness.  Psychiatric/Behavioral: Negative for dysphoric mood and agitation.       Objective:     Blood pressure rechecked by me:  144-146/78-82  Physical Exam    Constitutional: She appears well-developed and well-nourished. No distress.  HENT:  Nose: Nose normal.  Mouth/Throat: Oropharynx is clear and moist.  Neck: Neck supple. No thyromegaly present.  Cardiovascular: Normal rate and regular rhythm.   Pulmonary/Chest: Breath sounds normal. No respiratory distress. She has no wheezes.  Abdominal: Soft. Bowel sounds are normal. There is no tenderness.  Musculoskeletal: She exhibits no edema or tenderness.  Lymphadenopathy:    She has no cervical adenopathy.    BP 120/80 mmHg  Pulse 68  Temp(Src) 98.3 F (36.8 C) (Oral)  Resp 18  Ht 5\' 2"  (1.575 m)  Wt 142 lb (64.411 kg)  BMI 25.97 kg/m2  SpO2 97% Wt Readings from Last 3 Encounters:  11/09/15 142 lb (64.411 kg)  10/20/15 141 lb 9.6 oz (64.229 kg)  10/13/15 143 lb 8 oz (65.091 kg)     Lab Results  Component Value Date   WBC 9.3 05/13/2015   HGB 14.1 05/13/2015   HCT 41.2 05/13/2015   PLT 288.0 05/13/2015   GLUCOSE 96 05/13/2015   CHOL 201* 06/25/2015   TRIG 69.0 06/25/2015   HDL 72.40 06/25/2015   LDLDIRECT 118.2 10/16/2006   LDLCALC 115* 06/25/2015   ALT 24 05/13/2015   AST 19 05/13/2015   NA 142 05/13/2015   K 4.9 05/13/2015   CL 104 05/13/2015   CREATININE 0.65 05/13/2015   BUN 14 05/13/2015   CO2 31 05/13/2015   TSH 0.54 06/25/2015        Assessment & Plan:   Problem List Items Addressed This Visit    Essential hypertension - Primary    Blood pressure elevated.  Start lisinopril 10mg  q day.  Follow pressures.  Check metabolic panel in 2 weeks.  Have her spot check her pressure.  Has minimal headache.  Follow.  Treat blood pressure and see if improves.  Discussed further w/up.        Relevant Medications   lisinopril (PRINIVIL,ZESTRIL) 10 MG tablet   Other Relevant Orders   Basic metabolic panel       Einar Pheasant, MD

## 2015-11-09 NOTE — Progress Notes (Signed)
Pre-visit discussion using our clinic review tool. No additional management support is needed unless otherwise documented below in the visit note.  

## 2015-11-15 ENCOUNTER — Encounter: Payer: Self-pay | Admitting: Internal Medicine

## 2015-11-15 NOTE — Assessment & Plan Note (Addendum)
Blood pressure elevated.  Start lisinopril 10mg  q day.  Follow pressures.  Check metabolic panel in 2 weeks.  Have her spot check her pressure.  Has minimal headache.  Follow.  Treat blood pressure and see if improves.  Discussed further w/up.

## 2015-11-23 ENCOUNTER — Other Ambulatory Visit (INDEPENDENT_AMBULATORY_CARE_PROVIDER_SITE_OTHER): Payer: PPO

## 2015-11-23 DIAGNOSIS — E785 Hyperlipidemia, unspecified: Secondary | ICD-10-CM | POA: Diagnosis not present

## 2015-11-23 DIAGNOSIS — R030 Elevated blood-pressure reading, without diagnosis of hypertension: Secondary | ICD-10-CM

## 2015-11-23 DIAGNOSIS — I7300 Raynaud's syndrome without gangrene: Secondary | ICD-10-CM

## 2015-11-23 DIAGNOSIS — IMO0001 Reserved for inherently not codable concepts without codable children: Secondary | ICD-10-CM

## 2015-11-23 DIAGNOSIS — Z1159 Encounter for screening for other viral diseases: Secondary | ICD-10-CM

## 2015-11-23 DIAGNOSIS — I10 Essential (primary) hypertension: Secondary | ICD-10-CM

## 2015-11-23 LAB — LIPID PANEL
CHOL/HDL RATIO: 3
CHOLESTEROL: 226 mg/dL — AB (ref 0–200)
HDL: 75.9 mg/dL (ref >39.00)
LDL CALC: 129 mg/dL — AB (ref 0–99)
NonHDL: 150.25
TRIGLYCERIDES: 108 mg/dL (ref 0.0–149.0)
VLDL: 21.6 mg/dL (ref 0.0–40.0)

## 2015-11-23 LAB — BASIC METABOLIC PANEL
BUN: 15 mg/dL (ref 6–23)
CHLORIDE: 103 meq/L (ref 96–112)
CO2: 32 mEq/L (ref 19–32)
Calcium: 9.6 mg/dL (ref 8.4–10.5)
Creatinine, Ser: 0.71 mg/dL (ref 0.40–1.20)
GFR: 87.42 mL/min (ref >60.00)
Glucose, Bld: 89 mg/dL (ref 70–99)
POTASSIUM: 4.6 meq/L (ref 3.5–5.1)
SODIUM: 141 meq/L (ref 135–145)

## 2015-11-23 LAB — HEPATIC FUNCTION PANEL
ALBUMIN: 4.5 g/dL (ref 3.5–5.2)
ALT: 40 U/L — AB (ref 0–35)
AST: 23 U/L (ref 0–37)
Alkaline Phosphatase: 72 U/L (ref 39–117)
Bilirubin, Direct: 0.1 mg/dL (ref 0.0–0.3)
TOTAL PROTEIN: 6.3 g/dL (ref 6.0–8.3)
Total Bilirubin: 0.4 mg/dL (ref 0.2–1.2)

## 2015-11-23 LAB — RHEUMATOID FACTOR

## 2015-11-23 LAB — C-REACTIVE PROTEIN: CRP: 0.1 mg/dL — AB (ref 0.5–20.0)

## 2015-11-23 LAB — SEDIMENTATION RATE: SED RATE: 7 mm/h (ref 0–22)

## 2015-11-23 LAB — HEPATITIS C ANTIBODY: HCV Ab: NEGATIVE

## 2015-11-24 ENCOUNTER — Other Ambulatory Visit: Payer: Self-pay | Admitting: Internal Medicine

## 2015-11-24 ENCOUNTER — Encounter: Payer: Self-pay | Admitting: *Deleted

## 2015-11-24 DIAGNOSIS — R945 Abnormal results of liver function studies: Secondary | ICD-10-CM

## 2015-11-24 DIAGNOSIS — R7989 Other specified abnormal findings of blood chemistry: Secondary | ICD-10-CM

## 2015-11-24 DIAGNOSIS — M8580 Other specified disorders of bone density and structure, unspecified site: Secondary | ICD-10-CM

## 2015-11-24 LAB — ANA: Anti Nuclear Antibody(ANA): NEGATIVE

## 2015-11-24 NOTE — Progress Notes (Signed)
Order placed for f/u liver panel.  

## 2015-11-24 NOTE — Progress Notes (Signed)
Order placed for f/u vitamin D.   

## 2015-11-25 ENCOUNTER — Encounter: Payer: Self-pay | Admitting: Internal Medicine

## 2015-12-18 ENCOUNTER — Telehealth: Payer: Self-pay | Admitting: Internal Medicine

## 2015-12-18 MED ORDER — AZELASTINE HCL 0.1 % NA SOLN: 1.0000 | Freq: Two times a day (BID) | NASAL | Status: DC

## 2015-12-18 NOTE — Telephone Encounter (Signed)
Patient advised to add Astelin. Rx sent to pharmacy. Patient aware.

## 2015-12-18 NOTE — Telephone Encounter (Signed)
Please advise if patient should continue OTC medications (has tried Zyrtec, but is currently taking Allegra and Fluticasone).

## 2015-12-18 NOTE — Telephone Encounter (Signed)
Pt called about having a cough and drainage and eyes watery for four weeks now. Pt has been taking over the counter meds for four weeks too. Pt wanted to speak to nurse to see if it's ok tokeep taking OTC meds until her appt on 05/30? Call pt @ [phone] or cell [phone]. Thank you!

## 2015-12-18 NOTE — Telephone Encounter (Signed)
With eyes watering and drainage, does sound like allergies.  If already on flonase and allegra, good regimen for allergies.  Can try to add astelin nasal spray - one spray bid and see if this helps.  If persistent symptoms, can evaluate.  Thanks

## 2015-12-22 ENCOUNTER — Other Ambulatory Visit: Payer: PPO

## 2016-01-06 DIAGNOSIS — H40053 Ocular hypertension, bilateral: Secondary | ICD-10-CM | POA: Diagnosis not present

## 2016-01-07 ENCOUNTER — Other Ambulatory Visit (INDEPENDENT_AMBULATORY_CARE_PROVIDER_SITE_OTHER): Payer: PPO

## 2016-01-07 DIAGNOSIS — M8580 Other specified disorders of bone density and structure, unspecified site: Secondary | ICD-10-CM

## 2016-01-07 DIAGNOSIS — R7989 Other specified abnormal findings of blood chemistry: Secondary | ICD-10-CM | POA: Diagnosis not present

## 2016-01-07 DIAGNOSIS — R945 Abnormal results of liver function studies: Secondary | ICD-10-CM

## 2016-01-07 LAB — HEPATIC FUNCTION PANEL
ALT: 31 U/L (ref 0–35)
AST: 25 U/L (ref 0–37)
Albumin: 4.4 g/dL (ref 3.5–5.2)
Alkaline Phosphatase: 60 U/L (ref 39–117)
BILIRUBIN DIRECT: 0.1 mg/dL (ref 0.0–0.3)
BILIRUBIN TOTAL: 0.6 mg/dL (ref 0.2–1.2)
Total Protein: 6.2 g/dL (ref 6.0–8.3)

## 2016-01-07 LAB — VITAMIN D 25 HYDROXY (VIT D DEFICIENCY, FRACTURES): VITD: 35.48 ng/mL (ref 30.00–100.00)

## 2016-01-11 ENCOUNTER — Encounter: Payer: Self-pay | Admitting: Internal Medicine

## 2016-01-12 ENCOUNTER — Ambulatory Visit (INDEPENDENT_AMBULATORY_CARE_PROVIDER_SITE_OTHER): Payer: PPO | Admitting: Internal Medicine

## 2016-01-12 ENCOUNTER — Encounter: Payer: Self-pay | Admitting: Internal Medicine

## 2016-01-12 VITALS — BP 130/90 | HR 68 | Temp 98.5°F | Resp 18 | Ht 62.0 in | Wt 139.0 lb

## 2016-01-12 DIAGNOSIS — Z9109 Other allergy status, other than to drugs and biological substances: Secondary | ICD-10-CM

## 2016-01-12 DIAGNOSIS — R030 Elevated blood-pressure reading, without diagnosis of hypertension: Secondary | ICD-10-CM

## 2016-01-12 DIAGNOSIS — E785 Hyperlipidemia, unspecified: Secondary | ICD-10-CM | POA: Diagnosis not present

## 2016-01-12 DIAGNOSIS — Z91048 Other nonmedicinal substance allergy status: Secondary | ICD-10-CM | POA: Diagnosis not present

## 2016-01-12 DIAGNOSIS — E039 Hypothyroidism, unspecified: Secondary | ICD-10-CM | POA: Diagnosis not present

## 2016-01-12 DIAGNOSIS — F419 Anxiety disorder, unspecified: Secondary | ICD-10-CM

## 2016-01-12 DIAGNOSIS — IMO0001 Reserved for inherently not codable concepts without codable children: Secondary | ICD-10-CM

## 2016-01-12 NOTE — Progress Notes (Signed)
Patient ID: [name] [name], female   DOB: 05/15/1951, 65 y.o.   MRN: [id number]   Subjective:    Patient ID: [name] [name], female    DOB: 12-May-1951, 65 y.o.   MRN: [id number]  HPI  Patient here for a scheduled follow up.  She stopped taking her lisinopril.  Blood pressure has been doing well off the medication.  Joint pain has stopped now.  She feels better.  She does report some drainage.  Taking claritin.  Continue flonase and astelin.  She is exercising.  No chest pain.  No sob.  On zantac.  No abdominal pain or cramping.  Bowels stable.     Past Medical History  Diagnosis Date  . Adenomatous polyp 2005  . HTN (hypertension)   . Hypothyroidism   . Environmental allergies   . History of chicken pox   . Arrhythmia     H/O  . Hypercholesterolemia   . CTS (carpal tunnel syndrome)   . Basal cell carcinoma    Past Surgical History  Procedure Laterality Date  . Tubal ligation    . Cts releast rt wrist    . Lipoma removal      Rt shoulder   . Melanoma excision      right buttocks  . Basal cell carcinoma excision      right hip,abd, right arm, back neck  . Polypectomy      + TA  . Breast cyst aspiration Right     negative   Family History  Problem Relation Age of Onset  . Cervical cancer Mother   . Arthritis Mother   . Hypertension Mother   . Sudden death Mother     cerbral hemorrage  . Diabetes Maternal Grandmother   . Heart disease Father   . Cancer Father     lung cancer  . Colon cancer Neg Hx   . Breast cancer Paternal Aunt 46   Social History   Social History  . Marital Status: Married    Spouse Name: N/A  . Number of Children: 1  . Years of Education: N/A   Occupational History  . [employer]     Social History Main Topics  . Smoking status: Former Smoker    Types: Cigarettes    Quit date: 07/16/1980  . Smokeless tobacco: Never Used  . Alcohol Use: 4.2 oz/week    7 Glasses of wine per week     Comment: 1 per day   . Drug Use: No    . Sexual Activity: Not Asked   Other Topics Concern  . None   Social History Narrative   Married; 1 son.    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 azelastine (ASTELIN) 0.1 % nasal spray Place 1 spray into both nostrils 2 (two) times daily. Use in each nostril as directed  . BIOTIN PO Take 500 mg by mouth daily.  . clobetasol (TEMOVATE) 0.05 % external solution Apply 1 application topically daily.  . Eszopiclone 3 MG TABS TAKE ONE TABLET AT BEDTIME  . fluticasone (FLONASE) 50 MCG/ACT nasal spray USE 2 SPRAYS IN EACH NOSTRIL EVERY DAY  . lisinopril (PRINIVIL,ZESTRIL) 10 MG tablet Take 1 tablet (10 mg total) by mouth daily.  . Nutritional Supplements (NUTRITIONAL SUPPLEMENT PO) Take by mouth. DoTERRA: ALPHA CRS+, xEO MEGA, MICROPLEX MVp, ON GUARD (protective blend)  .  ranitidine (ZANTAC) 150 MG tablet Take by mouth.  . SYNTHROID 150 MCG tablet TAKE 1 TABLET EVERY DAY  . LORazepam (ATIVAN) 0.5 MG tablet Take 1/2-1 tablet bid prn (Patient not taking: Reported on 01/12/2016)  . [DISCONTINUED] Calcium-Magnesium (CAL/MAG CITRATE) 250-125 MG TABS Take 1 tablet by mouth daily. Reported on 01/12/2016  . [DISCONTINUED] Cholecalciferol (VITAMIN D) 2000 UNITS tablet Take 2,000 Units by mouth daily. Reported on 01/12/2016  . [DISCONTINUED] esomeprazole (NEXIUM) 40 MG capsule Take 1 capsule (40 mg total) by mouth daily. (Patient not taking: Reported on 01/12/2016)  . [DISCONTINUED] Multiple Vitamin (MULTIVITAMIN) tablet Take 1 tablet by mouth daily. Reported on 01/12/2016  . [DISCONTINUED] OMEGA 3 340 MG CPDR Take 1 capsule by mouth daily. Reported on 01/12/2016  . [DISCONTINUED] Red Yeast Rice 600 MG TABS Take by mouth daily. Reported on 01/12/2016   No facility-administered encounter medications on file as of 01/12/2016.    Review of Systems  Constitutional: Negative for appetite change and  unexpected weight change.  HENT: Positive for congestion and postnasal drip. Negative for sinus pressure.   Respiratory: Negative for cough, chest tightness and shortness of breath.   Cardiovascular: Negative for chest pain, palpitations and leg swelling.  Gastrointestinal: Negative for nausea, vomiting, abdominal pain and diarrhea.  Genitourinary: Negative for dysuria and difficulty urinating.  Musculoskeletal: Negative for back pain and joint swelling.  Skin: Negative for color change and rash.  Neurological: Negative for dizziness, light-headedness and headaches.  Psychiatric/Behavioral: Negative for dysphoric mood and agitation.       Objective:     Blood pressure rechecked by me:  128/84  Physical Exam  Constitutional: She appears well-developed and well-nourished. No distress.  HENT:  Nose: Nose normal.  Mouth/Throat: Oropharynx is clear and moist.  Neck: Neck supple. No thyromegaly present.  Cardiovascular: Normal rate and regular rhythm.   Pulmonary/Chest: Breath sounds normal. No respiratory distress. She has no wheezes.  Abdominal: Soft. Bowel sounds are normal. There is no tenderness.  Musculoskeletal: She exhibits no edema or tenderness.  Lymphadenopathy:    She has no cervical adenopathy.  Skin: No rash noted. No erythema.  Psychiatric: She has a normal mood and affect. Her behavior is normal.    BP 130/90 mmHg  Pulse 68  Temp(Src) 98.5 F (36.9 C) (Oral)  Resp 18  Ht 5\' 2"  (1.575 m)  Wt 139 lb (63.05 kg)  BMI 25.42 kg/m2  SpO2 98% Wt Readings from Last 3 Encounters:  01/12/16 139 lb (63.05 kg)  11/09/15 142 lb (64.411 kg)  10/20/15 141 lb 9.6 oz (64.229 kg)     Lab Results  Component Value Date   WBC 9.3 05/13/2015   HGB 14.1 05/13/2015   HCT 41.2 05/13/2015   PLT 288.0 05/13/2015   GLUCOSE 89 11/23/2015   CHOL 226* 11/23/2015   TRIG 108.0 11/23/2015   HDL 75.90 11/23/2015   LDLDIRECT 118.2 10/16/2006   LDLCALC 129* 11/23/2015   ALT 31  01/07/2016   AST 25 01/07/2016   NA 141 11/23/2015   K 4.6 11/23/2015   CL 103 11/23/2015   CREATININE 0.71 11/23/2015   BUN 15 11/23/2015   CO2 32 11/23/2015   TSH 0.54 06/25/2015    Dg Esophagus  08/12/2015  CLINICAL DATA:  Dysphagia. EXAM: ESOPHOGRAM / BARIUM SWALLOW / BARIUM TABLET STUDY TECHNIQUE: Combined double contrast and single contrast examination performed using effervescent crystals, thick barium liquid, and thin barium liquid. The patient was observed with fluoroscopy swallowing a 13 mm barium sulphate  tablet. FLUOROSCOPY TIME:  Radiation Exposure Index (as provided by the fluoroscopic device): 31.9 mGy COMPARISON:  None. FINDINGS: Cervical esophagus is unremarkable. Tertiary esophageal contractions are noted. Small sliding hiatal hernia with small B ring noted. Standardized barium tablet passed normally. No reflux. IMPRESSION: Small sliding hiatal hernia with small B ring. No evidence of obstruction. Standardized barium tab tablet passed normally. No reflux. 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 Electronically Signed   By: Marcello Moores  Register   On: 08/12/2015 10:31       Assessment & Plan:   Problem List Items Addressed This Visit    Anxiety    Doing better.        Elevated blood pressure    Blood pressure doing well on no medication.  Follow.        Relevant Orders   CBC with Differential/Platelet   Basic metabolic panel   Environmental allergies    On current regimen.  Astelin, flonase and antihistamine.        Hyperlipidemia - Primary    Continue low cholesterol diet and exercise.  Follow lipid panel.        Relevant Orders   Lipid panel   Hepatic function panel   Hypothyroidism    On thyroid replacement.  Follow tsh.       Relevant Orders   TSH       Einar Pheasant, MD

## 2016-01-12 NOTE — Progress Notes (Signed)
Pre-visit discussion using our clinic review tool. No additional management support is needed unless otherwise documented below in the visit note.  

## 2016-01-17 ENCOUNTER — Encounter: Payer: Self-pay | Admitting: Internal Medicine

## 2016-01-17 NOTE — Assessment & Plan Note (Signed)
Doing better.   

## 2016-01-17 NOTE — Assessment & Plan Note (Signed)
Continue low cholesterol diet and exercise.  Follow lipid panel.

## 2016-01-17 NOTE — Assessment & Plan Note (Signed)
Blood pressure doing well on no medication.  Follow.   

## 2016-01-17 NOTE — Assessment & Plan Note (Signed)
On thyroid replacement.  Follow tsh.  

## 2016-01-17 NOTE — Assessment & Plan Note (Signed)
On current regimen.  Astelin, flonase and antihistamine.

## 2016-01-20 DIAGNOSIS — J301 Allergic rhinitis due to pollen: Secondary | ICD-10-CM | POA: Diagnosis not present

## 2016-01-20 DIAGNOSIS — H6123 Impacted cerumen, bilateral: Secondary | ICD-10-CM | POA: Diagnosis not present

## 2016-01-26 DIAGNOSIS — Z1283 Encounter for screening for malignant neoplasm of skin: Secondary | ICD-10-CM | POA: Diagnosis not present

## 2016-01-26 DIAGNOSIS — Z85820 Personal history of malignant melanoma of skin: Secondary | ICD-10-CM | POA: Diagnosis not present

## 2016-01-26 DIAGNOSIS — L638 Other alopecia areata: Secondary | ICD-10-CM | POA: Diagnosis not present

## 2016-01-26 DIAGNOSIS — L728 Other follicular cysts of the skin and subcutaneous tissue: Secondary | ICD-10-CM | POA: Diagnosis not present

## 2016-01-26 DIAGNOSIS — Z08 Encounter for follow-up examination after completed treatment for malignant neoplasm: Secondary | ICD-10-CM | POA: Diagnosis not present

## 2016-01-26 DIAGNOSIS — Z85828 Personal history of other malignant neoplasm of skin: Secondary | ICD-10-CM | POA: Diagnosis not present

## 2016-01-26 DIAGNOSIS — Z872 Personal history of diseases of the skin and subcutaneous tissue: Secondary | ICD-10-CM | POA: Diagnosis not present

## 2016-02-15 ENCOUNTER — Other Ambulatory Visit: Payer: Self-pay | Admitting: Internal Medicine

## 2016-02-19 ENCOUNTER — Other Ambulatory Visit: Payer: Self-pay | Admitting: Internal Medicine

## 2016-04-06 DIAGNOSIS — H43812 Vitreous degeneration, left eye: Secondary | ICD-10-CM | POA: Diagnosis not present

## 2016-04-11 ENCOUNTER — Other Ambulatory Visit (INDEPENDENT_AMBULATORY_CARE_PROVIDER_SITE_OTHER): Payer: PPO

## 2016-04-11 DIAGNOSIS — R030 Elevated blood-pressure reading, without diagnosis of hypertension: Secondary | ICD-10-CM | POA: Diagnosis not present

## 2016-04-11 DIAGNOSIS — IMO0001 Reserved for inherently not codable concepts without codable children: Secondary | ICD-10-CM

## 2016-04-11 DIAGNOSIS — E785 Hyperlipidemia, unspecified: Secondary | ICD-10-CM | POA: Diagnosis not present

## 2016-04-11 DIAGNOSIS — E039 Hypothyroidism, unspecified: Secondary | ICD-10-CM

## 2016-04-11 LAB — CBC WITH DIFFERENTIAL/PLATELET
Basophils Absolute: 0 10*3/uL (ref 0.0–0.1)
Basophils Relative: 0.5 % (ref 0.0–3.0)
EOS ABS: 0.3 10*3/uL (ref 0.0–0.7)
Eosinophils Relative: 4.7 % (ref 0.0–5.0)
HCT: 41 % (ref 36.0–46.0)
HEMOGLOBIN: 14.2 g/dL (ref 12.0–15.0)
LYMPHS ABS: 1.5 10*3/uL (ref 0.7–4.0)
Lymphocytes Relative: 25.9 % (ref 12.0–46.0)
MCHC: 34.7 g/dL (ref 30.0–36.0)
MCV: 87.3 fl (ref 78.0–100.0)
MONO ABS: 0.5 10*3/uL (ref 0.1–1.0)
Monocytes Relative: 7.8 % (ref 3.0–12.0)
NEUTROS PCT: 61.1 % (ref 43.0–77.0)
Neutro Abs: 3.5 10*3/uL (ref 1.4–7.7)
Platelets: 256 10*3/uL (ref 150.0–400.0)
RBC: 4.69 Mil/uL (ref 3.87–5.11)
RDW: 12.5 % (ref 11.5–15.5)
WBC: 5.8 10*3/uL (ref 4.0–10.5)

## 2016-04-11 LAB — HEPATIC FUNCTION PANEL
ALT: 28 U/L (ref 0–35)
AST: 21 U/L (ref 0–37)
Albumin: 4.4 g/dL (ref 3.5–5.2)
Alkaline Phosphatase: 58 U/L (ref 39–117)
BILIRUBIN DIRECT: 0.1 mg/dL (ref 0.0–0.3)
TOTAL PROTEIN: 6.4 g/dL (ref 6.0–8.3)
Total Bilirubin: 0.5 mg/dL (ref 0.2–1.2)

## 2016-04-11 LAB — TSH: TSH: 2.25 u[IU]/mL (ref 0.35–4.50)

## 2016-04-11 LAB — BASIC METABOLIC PANEL
BUN: 15 mg/dL (ref 6–23)
CO2: 30 mEq/L (ref 19–32)
Calcium: 9.1 mg/dL (ref 8.4–10.5)
Chloride: 104 mEq/L (ref 96–112)
Creatinine, Ser: 0.71 mg/dL (ref 0.40–1.20)
GFR: 87.32 mL/min (ref >60.00)
GLUCOSE: 97 mg/dL (ref 70–99)
POTASSIUM: 4.2 meq/L (ref 3.5–5.1)
Sodium: 139 mEq/L (ref 135–145)

## 2016-04-11 LAB — LIPID PANEL
CHOLESTEROL: 220 mg/dL — AB (ref 0–200)
HDL: 77.7 mg/dL (ref >39.00)
LDL CALC: 124 mg/dL — AB (ref 0–99)
NonHDL: 142.37
Total CHOL/HDL Ratio: 3
Triglycerides: 93 mg/dL (ref 0.0–149.0)
VLDL: 18.6 mg/dL (ref 0.0–40.0)

## 2016-04-12 ENCOUNTER — Encounter: Payer: Self-pay | Admitting: Internal Medicine

## 2016-04-14 ENCOUNTER — Encounter: Payer: Self-pay | Admitting: Internal Medicine

## 2016-04-14 ENCOUNTER — Ambulatory Visit (INDEPENDENT_AMBULATORY_CARE_PROVIDER_SITE_OTHER): Payer: PPO | Admitting: Internal Medicine

## 2016-04-14 DIAGNOSIS — Z23 Encounter for immunization: Secondary | ICD-10-CM

## 2016-04-14 DIAGNOSIS — E785 Hyperlipidemia, unspecified: Secondary | ICD-10-CM | POA: Diagnosis not present

## 2016-04-14 DIAGNOSIS — F419 Anxiety disorder, unspecified: Secondary | ICD-10-CM

## 2016-04-14 DIAGNOSIS — G479 Sleep disorder, unspecified: Secondary | ICD-10-CM

## 2016-04-14 DIAGNOSIS — I10 Essential (primary) hypertension: Secondary | ICD-10-CM

## 2016-04-14 DIAGNOSIS — E039 Hypothyroidism, unspecified: Secondary | ICD-10-CM | POA: Diagnosis not present

## 2016-04-14 DIAGNOSIS — R002 Palpitations: Secondary | ICD-10-CM | POA: Diagnosis not present

## 2016-04-14 MED ORDER — IRBESARTAN 75 MG PO TABS: ORAL_TABLET | ORAL | 1 refills | Status: DC

## 2016-04-14 NOTE — Progress Notes (Signed)
Patient ID: [name] [name], female   DOB: 04-18-1951, 65 y.o.   MRN: [id number]   Subjective:    Patient ID: [name] [name], female    DOB: Nov 09, 1950, 65 y.o.   MRN: [id number]  HPI  Patient here for a scheduled follow up.  She recently had a vitreous detachment.  Saw eye md.  Pressure ok.  Has f/u planned in 05/2016.  Is exercising.  Stays active.  No cardiac symptoms with increased activity or exertion.  No sob.  No acid reflux.  No abdominal pain or cramping.  Blood pressure is remaining a little elevated.  Discussed with her today.  Discussed starting blood pressure medication.  Has taken avapro and tolerates.  Increased stress with family issues.  Overall she feels she is handling things relatively well.  Discussed lab results.     Past Medical History:  Diagnosis Date  . Adenomatous polyp 2005  . Arrhythmia    H/O  . Basal cell carcinoma   . CTS (carpal tunnel syndrome)   . Environmental allergies   . History of chicken pox   . HTN (hypertension)   . Hypercholesterolemia   . Hypothyroidism    Past Surgical History:  Procedure Laterality Date  . BASAL CELL CARCINOMA EXCISION     right hip,abd, right arm, back neck  . BREAST CYST ASPIRATION Right    negative  . CTS releast Rt wrist    . lipoma removal     Rt shoulder   . MELANOMA EXCISION     right buttocks  . POLYPECTOMY     + TA  . TUBAL LIGATION     Family History  Problem Relation Age of Onset  . Cervical cancer Mother   . Arthritis Mother   . Hypertension Mother   . Sudden death Mother     cerbral hemorrage  . Heart disease Father   . Cancer Father     lung cancer  . Diabetes Maternal Grandmother   . Breast cancer Paternal Aunt 74  . Colon cancer Neg Hx    Social History   Social History  . Marital status: Married    Spouse name: N/A  . Number of children: 1  . Years of education: N/A   Occupational History  . 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 Smoking  status: Former Smoker    Types: Cigarettes    Quit date: 07/16/1980  . Smokeless tobacco: Never Used  . Alcohol use 4.2 oz/week    7 Glasses of wine per week     Comment: 1 per day   . Drug use: No  . Sexual activity: Not Asked   Other Topics Concern  . None   Social History Narrative   Married; 1 son.    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 azelastine (ASTELIN) 0.1 % nasal spray Place 1 spray into both nostrils 2 (two) times daily. Use in each nostril as directed  . BIOTIN PO Take 500 mg by mouth daily.  . cetirizine (ZYRTEC) 10 MG tablet Take 10 mg by mouth daily.  . clobetasol (TEMOVATE) 0.05 % external solution Apply 1 application topically daily.  . fluticasone (FLONASE) 50 MCG/ACT nasal spray USE 2 SPRAYS IN EACH NOSTRIL EVERY DAY  . LORazepam (ATIVAN) 0.5 MG tablet Take 1/2-1 tablet bid prn  .  Nutritional Supplements (NUTRITIONAL SUPPLEMENT PO) Take by mouth. DoTERRA: ALPHA CRS+, xEO MEGA, MICROPLEX MVp, ON GUARD (protective blend)  . ranitidine (ZANTAC) 150 MG tablet Take by mouth.  . SYNTHROID 150 MCG tablet TAKE 1 TABLET EVERY DAY  . [DISCONTINUED] Eszopiclone 3 MG TABS TAKE ONE TABLET AT BEDTIME  . irbesartan (AVAPRO) 75 MG tablet 1/2 - 1 q day  . [DISCONTINUED] lisinopril (PRINIVIL,ZESTRIL) 10 MG tablet Take 1 tablet (10 mg total) by mouth daily.   No facility-administered encounter medications on file as of 04/14/2016.     Review of Systems  Constitutional: Negative for appetite change and unexpected weight change.  HENT: Negative for congestion and sinus pressure.   Eyes:       Vitreous detachment as outlined.    Respiratory: Negative for cough, chest tightness and shortness of breath.   Cardiovascular: Negative for chest pain, palpitations and leg swelling.  Gastrointestinal: Negative for abdominal pain, diarrhea, nausea and vomiting.  Genitourinary:  Negative for difficulty urinating and dysuria.  Musculoskeletal: Negative for back pain and joint swelling.  Skin: Negative for color change and rash.  Neurological: Negative for dizziness, light-headedness and headaches.  Psychiatric/Behavioral: Negative for agitation and dysphoric mood.       Increased stress as outlined.         Objective:    Physical Exam  BP (!) 142/92 (BP Location: Right Arm, Patient Position: Sitting, Cuff Size: Normal) Comment: under stress due to father falling  Pulse 68   Temp 97.7 F (36.5 C) (Oral)   Resp 16   Wt 140 lb (63.5 kg)   BMI 25.61 kg/m  Wt Readings from Last 3 Encounters:  04/14/16 140 lb (63.5 kg)  01/12/16 139 lb (63 kg)  11/09/15 142 lb (64.4 kg)     Lab Results  Component Value Date   WBC 5.8 04/11/2016   HGB 14.2 04/11/2016   HCT 41.0 04/11/2016   PLT 256.0 04/11/2016   GLUCOSE 97 04/11/2016   CHOL 220 (H) 04/11/2016   TRIG 93.0 04/11/2016   HDL 77.70 04/11/2016   LDLDIRECT 118.2 10/16/2006   LDLCALC 124 (H) 04/11/2016   ALT 28 04/11/2016   AST 21 04/11/2016   NA 139 04/11/2016   K 4.2 04/11/2016   CL 104 04/11/2016   CREATININE 0.71 04/11/2016   BUN 15 04/11/2016   CO2 30 04/11/2016   TSH 2.25 04/11/2016    Dg Esophagus  Result Date: 08/12/2015 CLINICAL DATA:  Dysphagia. EXAM: ESOPHOGRAM / BARIUM SWALLOW / BARIUM TABLET STUDY TECHNIQUE: Combined double contrast and single contrast examination performed using effervescent crystals, thick barium liquid, and thin barium liquid. The patient was observed with fluoroscopy swallowing a 13 mm barium sulphate tablet. FLUOROSCOPY TIME:  Radiation Exposure Index (as provided by the fluoroscopic device): 31.9 mGy COMPARISON:  None. FINDINGS: Cervical esophagus is unremarkable. Tertiary esophageal contractions are noted. Small sliding hiatal hernia with small B ring noted. Standardized barium tablet passed normally. No reflux. IMPRESSION: Small sliding hiatal hernia with small B  ring. No evidence of obstruction. Standardized barium tab tablet passed normally. No reflux. 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 Electronically Signed   By: Marcello Moores  Register   On: 08/12/2015 10:31       Assessment & Plan:   Problem List Items Addressed This Visit    Anxiety    Increased stress.  Anxiety better.  Follow.        Essential hypertension    Blood pressure elevated.  Start avapro 36m 1/2 tablet  q day.  Follow pressures.  Check met b in 10-14 days.       Relevant Medications   irbesartan (AVAPRO) 75 MG tablet   Other Relevant Orders   Basic metabolic panel   Heart palpitations    Has been evaluated by cardiology.  Stable.  Follow.        Hyperlipidemia    Low cholesterol diet and exercise.  Follow lipid panel.        Relevant Medications   irbesartan (AVAPRO) 75 MG tablet   Hypothyroidism    On thyroid replacement.  Follow tsh.        Sleeping difficulty    Doing well on lunesta.         Other Visit Diagnoses    Encounter for immunization       Relevant Medications   cetirizine (ZYRTEC) 10 MG tablet   irbesartan (AVAPRO) 75 MG tablet   Other Relevant Orders   Flu vaccine HIGH DOSE PF (Completed)       Einar Pheasant, MD

## 2016-04-15 ENCOUNTER — Encounter: Payer: Self-pay | Admitting: Internal Medicine

## 2016-04-15 ENCOUNTER — Telehealth: Payer: Self-pay | Admitting: *Deleted

## 2016-04-15 MED ORDER — ESZOPICLONE 3 MG PO TABS: 3.0000 mg | ORAL_TABLET | Freq: Every day | ORAL | 1 refills | Status: DC

## 2016-04-15 NOTE — Telephone Encounter (Signed)
Patient called and states she needs a refill of her Lunesta. Her pharmacy is [facility]. Please advise provider. Thank you

## 2016-04-15 NOTE — Telephone Encounter (Signed)
Medication refilled

## 2016-04-17 ENCOUNTER — Encounter: Payer: Self-pay | Admitting: Internal Medicine

## 2016-04-17 NOTE — Assessment & Plan Note (Signed)
Blood pressure elevated.  Start avapro 2m 1/2 tablet q day.  Follow pressures.  Check met b in 10-14 days.

## 2016-04-17 NOTE — Assessment & Plan Note (Signed)
Low cholesterol diet and exercise.  Follow lipid panel.   

## 2016-04-17 NOTE — Assessment & Plan Note (Signed)
On thyroid replacement.  Follow tsh.  

## 2016-04-17 NOTE — Assessment & Plan Note (Signed)
Has been evaluated by cardiology.  Stable.  Follow.

## 2016-04-17 NOTE — Assessment & Plan Note (Signed)
Increased stress.  Anxiety better.  Follow.

## 2016-04-17 NOTE — Assessment & Plan Note (Signed)
Doing well on lunesta.

## 2016-04-25 ENCOUNTER — Other Ambulatory Visit (INDEPENDENT_AMBULATORY_CARE_PROVIDER_SITE_OTHER): Payer: PPO

## 2016-04-25 DIAGNOSIS — I10 Essential (primary) hypertension: Secondary | ICD-10-CM

## 2016-04-25 LAB — BASIC METABOLIC PANEL
BUN: 17 mg/dL (ref 6–23)
CALCIUM: 9.1 mg/dL (ref 8.4–10.5)
CO2: 29 mEq/L (ref 19–32)
CREATININE: 0.67 mg/dL (ref 0.40–1.20)
Chloride: 106 mEq/L (ref 96–112)
GFR: 93.35 mL/min (ref >60.00)
Glucose, Bld: 95 mg/dL (ref 70–99)
Potassium: 4.7 mEq/L (ref 3.5–5.1)
Sodium: 140 mEq/L (ref 135–145)

## 2016-04-26 ENCOUNTER — Encounter: Payer: Self-pay | Admitting: Internal Medicine

## 2016-05-26 ENCOUNTER — Encounter: Payer: Self-pay | Admitting: Internal Medicine

## 2016-05-27 ENCOUNTER — Other Ambulatory Visit: Payer: Self-pay | Admitting: Internal Medicine

## 2016-05-27 MED ORDER — LORAZEPAM 0.5 MG PO TABS: ORAL_TABLET | ORAL | 0 refills | Status: DC

## 2016-05-27 NOTE — Progress Notes (Signed)
rx ok'd for lorazepam #60 with no refills.

## 2016-06-08 ENCOUNTER — Telehealth: Payer: Self-pay | Admitting: *Deleted

## 2016-06-08 ENCOUNTER — Encounter: Payer: Self-pay | Admitting: Internal Medicine

## 2016-06-08 ENCOUNTER — Ambulatory Visit (INDEPENDENT_AMBULATORY_CARE_PROVIDER_SITE_OTHER): Payer: PPO | Admitting: Internal Medicine

## 2016-06-08 DIAGNOSIS — R002 Palpitations: Secondary | ICD-10-CM

## 2016-06-08 DIAGNOSIS — I10 Essential (primary) hypertension: Secondary | ICD-10-CM | POA: Diagnosis not present

## 2016-06-08 DIAGNOSIS — F419 Anxiety disorder, unspecified: Secondary | ICD-10-CM

## 2016-06-08 DIAGNOSIS — E785 Hyperlipidemia, unspecified: Secondary | ICD-10-CM | POA: Diagnosis not present

## 2016-06-08 MED ORDER — ESZOPICLONE 3 MG PO TABS: 3.0000 mg | ORAL_TABLET | Freq: Every day | ORAL | 1 refills | Status: DC

## 2016-06-08 MED ORDER — SERTRALINE HCL 50 MG PO TABS: 50.0000 mg | ORAL_TABLET | Freq: Every day | ORAL | 1 refills | Status: DC

## 2016-06-08 NOTE — Progress Notes (Signed)
Patient ID: [name] [name], female   DOB: April 08, 1951, 65 y.o.   MRN: [id number]   Subjective:    Patient ID: [name] [name], female    DOB: 01-10-1951, 65 y.o.   MRN: [id number]  HPI  Patient here for a scheduled follow up.  Here to f/u on her blood pressure.  Started on avapro last visit.  Tolerating.  Taking 75mg  q day.  Blood pressure doing better.  States systolic readings have been <130.  No chest pain.  No sob.  Exercises.  Increased stress.  Her husband has just been diagnosed with metastatic lung cancer.  Discussed at length with her.  She feels she is coping relatively well.  Using lorazepam prn.  Discussed longer term medication.  She is in agreement.   Is eating.     Past Medical History:  Diagnosis Date  . Adenomatous polyp 2005  . Arrhythmia    H/O  . Basal cell carcinoma   . CTS (carpal tunnel syndrome)   . Environmental allergies   . History of chicken pox   . HTN (hypertension)   . Hypercholesterolemia   . Hypothyroidism    Past Surgical History:  Procedure Laterality Date  . BASAL CELL CARCINOMA EXCISION     right hip,abd, right arm, back neck  . BREAST CYST ASPIRATION Right    negative  . CTS releast Rt wrist    . lipoma removal     Rt shoulder   . MELANOMA EXCISION     right buttocks  . POLYPECTOMY     + TA  . TUBAL LIGATION     Family History  Problem Relation Age of Onset  . Cervical cancer Mother   . Arthritis Mother   . Hypertension Mother   . Sudden death Mother     cerbral hemorrage  . Heart disease Father   . Cancer Father     lung cancer  . Diabetes Maternal Grandmother   . Breast cancer Paternal Aunt 74  . Colon cancer Neg Hx    Social History   Social History  . Marital status: Married    Spouse name: N/A  . Number of children: 1  . Years of education: N/A   Occupational History  . 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 Smoking status: Former Smoker    Types: Cigarettes    Quit date:  07/16/1980  . Smokeless tobacco: Never Used  . Alcohol use 4.2 oz/week    7 Glasses of wine per week     Comment: 1 per day   . Drug use: No  . Sexual activity: Not Asked   Other Topics Concern  . None   Social History Narrative   Married; 1 son.    Dental assistant   Daily caffeine use 2-3/day       Cell # [id number]    Outpatient Encounter Prescriptions as of 06/08/2016  Medication Sig  . azelastine (ASTELIN) 0.1 % nasal spray Place 1 spray into both nostrils 2 (two) times daily. Use in each nostril as directed  . BIOTIN PO Take 500 mg by mouth daily.  . cetirizine (ZYRTEC) 10 MG tablet Take 10 mg by mouth daily.  . clobetasol (TEMOVATE) 0.05 % external solution Apply 1 application topically daily.  . Eszopiclone 3 MG TABS Take 1 tablet (3 mg total) by mouth at bedtime. Take immediately before bedtime  . fluticasone (FLONASE) 50 MCG/ACT nasal spray USE 2 SPRAYS IN [facility]  NOSTRIL EVERY DAY  . irbesartan (AVAPRO) 75 MG tablet 1/2 - 1 q day  . LORazepam (ATIVAN) 0.5 MG tablet Take 1/2-1 tablet bid prn  . Nutritional Supplements (NUTRITIONAL SUPPLEMENT PO) Take by mouth. DoTERRA: ALPHA CRS+, xEO MEGA, MICROPLEX MVp, ON GUARD (protective blend)  . ranitidine (ZANTAC) 150 MG tablet Take by mouth 2 (two) times daily.   Marland Kitchen SYNTHROID 150 MCG tablet TAKE 1 TABLET EVERY DAY  . [DISCONTINUED] Eszopiclone 3 MG TABS Take 1 tablet (3 mg total) by mouth at bedtime. Take immediately before bedtime  . sertraline (ZOLOFT) 50 MG tablet Take 1 tablet (50 mg total) by mouth daily.   No facility-administered encounter medications on file as of 06/08/2016.     Review of Systems  Constitutional: Negative for appetite change and unexpected weight change.  HENT: Negative for congestion and sinus pressure.   Respiratory: Negative for cough, chest tightness and shortness of breath.   Cardiovascular: Negative for chest pain, palpitations and leg swelling.  Gastrointestinal: Negative for abdominal pain,  diarrhea, nausea and vomiting.  Genitourinary: Negative for difficulty urinating and dysuria.  Musculoskeletal: Negative for back pain and joint swelling.  Skin: Negative for color change and rash.  Neurological: Negative for dizziness, light-headedness and headaches.  Psychiatric/Behavioral: Negative for agitation and dysphoric mood.       Objective:    Physical Exam  Constitutional: She appears well-developed and well-nourished. No distress.  HENT:  Nose: Nose normal.  Mouth/Throat: Oropharynx is clear and moist.  Neck: Neck supple. No thyromegaly present.  Cardiovascular: Normal rate and regular rhythm.   Pulmonary/Chest: Breath sounds normal. No respiratory distress. She has no wheezes.  Abdominal: Soft. Bowel sounds are normal. There is no tenderness.  Musculoskeletal: She exhibits no edema or tenderness.  Lymphadenopathy:    She has no cervical adenopathy.  Skin: No rash noted. No erythema.  Psychiatric: She has a normal mood and affect. Her behavior is normal.    BP 132/88   Pulse 76   Temp 97.8 F (36.6 C) (Oral)   Ht 5\' 2"  (1.575 m)   Wt 139 lb (63 kg)   SpO2 98%   BMI 25.42 kg/m  Wt Readings from Last 3 Encounters:  06/08/16 139 lb (63 kg)  04/14/16 140 lb (63.5 kg)  01/12/16 139 lb (63 kg)     Lab Results  Component Value Date   WBC 5.8 04/11/2016   HGB 14.2 04/11/2016   HCT 41.0 04/11/2016   PLT 256.0 04/11/2016   GLUCOSE 95 04/25/2016   CHOL 220 (H) 04/11/2016   TRIG 93.0 04/11/2016   HDL 77.70 04/11/2016   LDLDIRECT 118.2 10/16/2006   LDLCALC 124 (H) 04/11/2016   ALT 28 04/11/2016   AST 21 04/11/2016   NA 140 04/25/2016   K 4.7 04/25/2016   CL 106 04/25/2016   CREATININE 0.67 04/25/2016   BUN 17 04/25/2016   CO2 29 04/25/2016   TSH 2.25 04/11/2016    Dg Esophagus  Result Date: 08/12/2015 CLINICAL DATA:  Dysphagia. EXAM: ESOPHOGRAM / BARIUM SWALLOW / BARIUM TABLET STUDY TECHNIQUE: Combined double contrast and single contrast  examination performed using effervescent crystals, thick barium liquid, and thin barium liquid. The patient was observed with fluoroscopy swallowing a 13 mm barium sulphate tablet. FLUOROSCOPY TIME:  Radiation Exposure Index (as provided by the fluoroscopic device): 31.9 mGy COMPARISON:  None. FINDINGS: Cervical esophagus is unremarkable. Tertiary esophageal contractions are noted. Small sliding hiatal hernia with small B ring noted. Standardized barium tablet passed normally.  No reflux. IMPRESSION: Small sliding hiatal hernia with small B ring. No evidence of obstruction. Standardized barium tab tablet passed normally. No reflux. 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 Electronically Signed   By: Marcello Moores  Register   On: 08/12/2015 10:31       Assessment & Plan:   Problem List Items Addressed This Visit    Anxiety    Increased stress as outlined.  Discussed with her today.  Discussed treatment options.  Continue lorazepam prn.  Start zoloft 50mg  as directed.  Follow closely.  Get her back in soon to reassess.        Relevant Medications   sertraline (ZOLOFT) 50 MG tablet   Essential hypertension    On avapro.  Blood pressure is doing better.  Follow.  Follow metabolic panel.        Heart palpitations    Have improved since starting avapro.        Hyperlipidemia    Low cholesterol diet and exercise.  Follow lipid panel.         Other Visit Diagnoses   None.      Einar Pheasant, MD'

## 2016-06-08 NOTE — Telephone Encounter (Signed)
Please give a time and date to schedule pt for a physical in 5 weeks  Pt contact [phone]

## 2016-06-08 NOTE — Patient Instructions (Signed)
Take zoloft 1/2 tablet daily for one week and then increase to one whole tablet daly.

## 2016-06-08 NOTE — Telephone Encounter (Signed)
Scheduled

## 2016-06-08 NOTE — Telephone Encounter (Signed)
Wed. 07/13/16 @ 12:30.

## 2016-06-08 NOTE — Progress Notes (Signed)
Pre visit review using our clinic review tool, if applicable. No additional management support is needed unless otherwise documented below in the visit note. 

## 2016-06-12 ENCOUNTER — Encounter: Payer: Self-pay | Admitting: Internal Medicine

## 2016-06-12 NOTE — Assessment & Plan Note (Signed)
On avapro.  Blood pressure is doing better.  Follow.  Follow metabolic panel.

## 2016-06-12 NOTE — Assessment & Plan Note (Signed)
Have improved since starting avapro.

## 2016-06-12 NOTE — Assessment & Plan Note (Signed)
Low cholesterol diet and exercise.  Follow lipid panel.   

## 2016-06-12 NOTE — Assessment & Plan Note (Signed)
Increased stress as outlined.  Discussed with her today.  Discussed treatment options.  Continue lorazepam prn.  Start zoloft 50mg  as directed.  Follow closely.  Get her back in soon to reassess.

## 2016-06-18 ENCOUNTER — Encounter: Payer: Self-pay | Admitting: Internal Medicine

## 2016-06-20 ENCOUNTER — Other Ambulatory Visit: Payer: Self-pay

## 2016-06-20 ENCOUNTER — Other Ambulatory Visit: Payer: Self-pay | Admitting: Internal Medicine

## 2016-06-20 NOTE — Telephone Encounter (Signed)
Reviewed chart.  avapro rx sent in to pharmacy today #30 with 2 refills.

## 2016-06-21 DIAGNOSIS — H40053 Ocular hypertension, bilateral: Secondary | ICD-10-CM | POA: Diagnosis not present

## 2016-06-30 ENCOUNTER — Other Ambulatory Visit: Payer: Self-pay | Admitting: Internal Medicine

## 2016-06-30 DIAGNOSIS — Z1239 Encounter for other screening for malignant neoplasm of breast: Secondary | ICD-10-CM

## 2016-06-30 NOTE — Progress Notes (Signed)
Order placed for 3D mammogram.  

## 2016-07-13 ENCOUNTER — Encounter: Payer: PPO | Admitting: Internal Medicine

## 2016-07-21 ENCOUNTER — Ambulatory Visit
Admission: RE | Admit: 2016-07-21 | Discharge: 2016-07-21 | Disposition: A | Discharge status: Home / Self Care | Payer: PPO | Source: Ambulatory Visit | Attending: Internal Medicine | Admitting: Internal Medicine

## 2016-07-21 ENCOUNTER — Other Ambulatory Visit: Payer: Self-pay | Admitting: Internal Medicine

## 2016-07-21 DIAGNOSIS — N6310 Unspecified lump in the right breast, unspecified quadrant: Secondary | ICD-10-CM

## 2016-07-21 DIAGNOSIS — Z1231 Encounter for screening mammogram for malignant neoplasm of breast: Secondary | ICD-10-CM | POA: Diagnosis not present

## 2016-07-21 DIAGNOSIS — R928 Other abnormal and inconclusive findings on diagnostic imaging of breast: Secondary | ICD-10-CM

## 2016-07-21 DIAGNOSIS — N6320 Unspecified lump in the left breast, unspecified quadrant: Secondary | ICD-10-CM

## 2016-07-21 DIAGNOSIS — Z1239 Encounter for other screening for malignant neoplasm of breast: Secondary | ICD-10-CM

## 2016-07-22 ENCOUNTER — Ambulatory Visit (INDEPENDENT_AMBULATORY_CARE_PROVIDER_SITE_OTHER): Payer: PPO | Admitting: Internal Medicine

## 2016-07-22 ENCOUNTER — Encounter: Payer: Self-pay | Admitting: Internal Medicine

## 2016-07-22 DIAGNOSIS — E039 Hypothyroidism, unspecified: Secondary | ICD-10-CM | POA: Diagnosis not present

## 2016-07-22 DIAGNOSIS — F419 Anxiety disorder, unspecified: Secondary | ICD-10-CM

## 2016-07-22 DIAGNOSIS — E785 Hyperlipidemia, unspecified: Secondary | ICD-10-CM

## 2016-07-22 DIAGNOSIS — Z86010 Personal history of colonic polyps: Secondary | ICD-10-CM

## 2016-07-22 DIAGNOSIS — Z0000 Encounter for general adult medical examination without abnormal findings: Secondary | ICD-10-CM | POA: Diagnosis not present

## 2016-07-22 DIAGNOSIS — I10 Essential (primary) hypertension: Secondary | ICD-10-CM

## 2016-07-22 MED ORDER — LORAZEPAM 0.5 MG PO TABS: ORAL_TABLET | ORAL | 0 refills | Status: DC

## 2016-07-22 NOTE — Progress Notes (Signed)
Patient ID: [name] [name], female   DOB: 12-Jul-1951, 65 y.o.   MRN: [id number]   Subjective:    Patient ID: [name] [name], female    DOB: 1951-05-11, 65 y.o.   MRN: [id number]  HPI  Patient here for her physical exam.  She reports she is doing relatively well.  Increased stress with her husband's medical issues.  He has been diagnosed with metastatic lung cancer.  Undergoing treatment now.  She has good support.  Planning to go to [place] soon.  Stays active.  No chest pain.  No sob.  No acid reflux.  No abdominal pain or cramping.  Bowels stable.  Overall she feels she is handling stress relatively well.     Past Medical History:  Diagnosis Date  . Adenomatous polyp 2005  . Arrhythmia    H/O  . Basal cell carcinoma   . CTS (carpal tunnel syndrome)   . Environmental allergies   . History of chicken pox   . HTN (hypertension)   . Hypercholesterolemia   . Hypothyroidism    Past Surgical History:  Procedure Laterality Date  . BASAL CELL CARCINOMA EXCISION     right hip,abd, right arm, back neck  . BREAST CYST ASPIRATION Right    negative  . CTS releast Rt wrist    . lipoma removal     Rt shoulder   . MELANOMA EXCISION     right buttocks  . POLYPECTOMY     + TA  . TUBAL LIGATION     Family History  Problem Relation Age of Onset  . Cervical cancer Mother   . Arthritis Mother   . Hypertension Mother   . Sudden death Mother     cerbral hemorrage  . Heart disease Father   . Cancer Father     lung cancer  . Diabetes Maternal Grandmother   . Breast cancer Paternal Aunt 74  . Colon cancer Neg Hx    Social History   Social History  . Marital status: Married    Spouse name: N/A  . Number of children: 1  . Years of education: N/A   Occupational History  . 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 Smoking status: Former Smoker    Types: Cigarettes    Quit date: 07/16/1980  . Smokeless tobacco: Never Used  . Alcohol use 4.2 oz/week      7 Glasses of wine per week     Comment: 1 per day   . Drug use: No  . Sexual activity: Not Asked   Other Topics Concern  . None   Social History Narrative   Married; 1 son.    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 azelastine (ASTELIN) 0.1 % nasal spray Place 1 spray into both nostrils 2 (two) times daily. Use in each nostril as directed (Patient taking differently: Place 1 spray into both nostrils daily as needed. Use in each nostril as directed)  . BIOTIN PO Take 500 mg by mouth daily.  . cetirizine (ZYRTEC) 10 MG tablet Take 10 mg by mouth daily.  . clobetasol (TEMOVATE) 0.05 % external solution Apply 1 application topically daily.  . Eszopiclone 3 MG TABS Take 1 tablet (3 mg total) by mouth at bedtime. Take immediately before bedtime  . fluticasone (FLONASE) 50 MCG/ACT nasal spray USE  2 SPRAYS IN EACH NOSTRIL EVERY DAY  . irbesartan (AVAPRO) 75 MG tablet TAKE ONE-HALF TO ONE TABLET DAILY  . LORazepam (ATIVAN) 0.5 MG tablet Take 1/2-1 tablet bid prn  . Nutritional Supplements (NUTRITIONAL SUPPLEMENT PO) Take by mouth. DoTERRA: ALPHA CRS+, xEO MEGA, MICROPLEX MVp, ON GUARD (protective blend)  . ranitidine (ZANTAC) 150 MG tablet Take by mouth 2 (two) times daily.   . sertraline (ZOLOFT) 50 MG tablet Take 1 tablet (50 mg total) by mouth daily.  Marland Kitchen SYNTHROID 150 MCG tablet TAKE 1 TABLET EVERY DAY  . [DISCONTINUED] LORazepam (ATIVAN) 0.5 MG tablet Take 1/2-1 tablet bid prn   No facility-administered encounter medications on file as of 07/22/2016.     Review of Systems  Constitutional: Negative for appetite change and unexpected weight change.  HENT: Negative for congestion and sinus pressure.   Eyes: Negative for pain and visual disturbance.  Respiratory: Negative for cough, chest tightness and shortness of breath.   Cardiovascular: Negative for chest pain, palpitations and  leg swelling.  Gastrointestinal: Negative for abdominal pain, diarrhea, nausea and vomiting.  Genitourinary: Negative for difficulty urinating and dysuria.  Musculoskeletal: Negative for back pain and joint swelling.  Skin: Negative for color change and rash.  Neurological: Negative for dizziness, light-headedness and headaches.  Hematological: Negative for adenopathy. Does not bruise/bleed easily.  Psychiatric/Behavioral: Negative for agitation and dysphoric mood.       Objective:     Blood pressure rechecked by me:  138/72  Physical Exam  Constitutional: She is oriented to person, place, and time. She appears well-developed and well-nourished. No distress.  HENT:  Nose: Nose normal.  Mouth/Throat: Oropharynx is clear and moist.  Eyes: Right eye exhibits no discharge. Left eye exhibits no discharge. No scleral icterus.  Neck: Neck supple. No thyromegaly present.  Cardiovascular: Normal rate and regular rhythm.   Pulmonary/Chest: Breath sounds normal. No accessory muscle usage. No tachypnea. No respiratory distress. She has no decreased breath sounds. She has no wheezes. She has no rhonchi. Right breast exhibits no inverted nipple, no mass, no nipple discharge and no tenderness (no axillary adenopathy). Left breast exhibits no inverted nipple, no mass, no nipple discharge and no tenderness (no axilarry adenopathy).  Abdominal: Soft. Bowel sounds are normal. There is no tenderness.  Musculoskeletal: She exhibits no edema or tenderness.  Lymphadenopathy:    She has no cervical adenopathy.  Neurological: She is alert and oriented to person, place, and time.  Skin: Skin is warm. No rash noted. No erythema.  Psychiatric: She has a normal mood and affect. Her behavior is normal.    BP 138/72   Pulse 67   Temp 98.6 F (37 C) (Oral)   Ht 5\' 2"  (1.575 m)   Wt 140 lb 6.4 oz (63.7 kg)   SpO2 97%   BMI 25.68 kg/m  Wt Readings from Last 3 Encounters:  07/22/16 140 lb 6.4 oz (63.7 kg)   06/08/16 139 lb (63 kg)  04/14/16 140 lb (63.5 kg)     Lab Results  Component Value Date   WBC 5.8 04/11/2016   HGB 14.2 04/11/2016   HCT 41.0 04/11/2016   PLT 256.0 04/11/2016   GLUCOSE 95 04/25/2016   CHOL 220 (H) 04/11/2016   TRIG 93.0 04/11/2016   HDL 77.70 04/11/2016   LDLDIRECT 118.2 10/16/2006   LDLCALC 124 (H) 04/11/2016   ALT 28 04/11/2016   AST 21 04/11/2016   NA 140 04/25/2016   K 4.7 04/25/2016   CL 106 04/25/2016  CREATININE 0.67 04/25/2016   BUN 17 04/25/2016   CO2 29 04/25/2016   TSH 2.25 04/11/2016    Mm Screening Breast Tomo Bilateral  Result Date: 07/21/2016 CLINICAL DATA:  Screening. EXAM: 2D DIGITAL SCREENING BILATERAL MAMMOGRAM WITH CAD AND ADJUNCT TOMO COMPARISON:  Previous exam(s). ACR Breast Density Category d: The breast tissue is extremely dense, which lowers the sensitivity of mammography. FINDINGS: In the right breast a possible mass requires further evaluation. In the left breast a possible mass requires further evaluation. Images were processed with CAD. IMPRESSION: Further evaluation is suggested for a possible mass in the right breast. Further evaluation is suggested for a possible mass in the left breast. RECOMMENDATION: Diagnostic mammogram and possibly ultrasound of both breasts. (Code:[id number]) The patient will be contacted regarding the findings, and additional imaging will be scheduled. BI-RADS CATEGORY  0: Incomplete. Need additional imaging evaluation and/or prior mammograms for comparison. Electronically Signed   By: Ammie Ferrier M.D.   On: 07/21/2016 13:47       Assessment & Plan:   Problem List Items Addressed This Visit    Anxiety    On zoloft.  She feels is helping.  This dose is working well.  Follow.  Has lorazepam if needed.  Follow.        Relevant Medications   LORazepam (ATIVAN) 0.5 MG tablet   Essential hypertension    Blood pressure on recheck improved.  Doing well on outside checks.  Follow pressures.  Follow  metabolic panel.        Relevant Orders   Basic metabolic panel   Health care maintenance    Physical today 07/22/16.   Colonoscopy 07/2013.  Mammogram yesterday as outlined.  Recommended f/u views.  Ordered.  Discussed with pt.  She will call to schedule.        History of colonic polyps    Colonoscopy 07/2013.       Hyperlipidemia    Low cholesterol diet and exercise.  Follow lipid panel.        Relevant Orders   Hepatic function panel   Lipid panel   Hypothyroidism    On thyroid replacement.  Follow tsh.            Einar Pheasant, MD

## 2016-07-22 NOTE — Progress Notes (Signed)
Pre visit review using our clinic review tool, if applicable. No additional management support is needed unless otherwise documented below in the visit note. 

## 2016-07-23 ENCOUNTER — Other Ambulatory Visit: Payer: Self-pay | Admitting: Internal Medicine

## 2016-07-23 DIAGNOSIS — R928 Other abnormal and inconclusive findings on diagnostic imaging of breast: Secondary | ICD-10-CM

## 2016-07-23 NOTE — Progress Notes (Signed)
Orders placed for f/u mammogram and ultrasound of both breasts.

## 2016-07-24 ENCOUNTER — Encounter: Payer: Self-pay | Admitting: Internal Medicine

## 2016-07-24 NOTE — Assessment & Plan Note (Signed)
Physical today 07/22/16.   Colonoscopy 07/2013.  Mammogram yesterday as outlined.  Recommended f/u views.  Ordered.  Discussed with pt.  She will call to schedule.

## 2016-07-24 NOTE — Assessment & Plan Note (Signed)
Blood pressure on recheck improved.  Doing well on outside checks.  Follow pressures.  Follow metabolic panel.

## 2016-07-24 NOTE — Assessment & Plan Note (Signed)
On thyroid replacement.  Follow tsh.  

## 2016-07-24 NOTE — Assessment & Plan Note (Signed)
On zoloft.  She feels is helping.  This dose is working well.  Follow.  Has lorazepam if needed.  Follow.

## 2016-07-24 NOTE — Assessment & Plan Note (Signed)
Low cholesterol diet and exercise.  Follow lipid panel.   

## 2016-07-24 NOTE — Assessment & Plan Note (Signed)
Colonoscopy 07/2013.   

## 2016-07-26 NOTE — Telephone Encounter (Signed)
She had informed me that the hospital was going to schedule, but apparently they have not scheduled.  Please schedule if you do not mind.  Thanks  See her note for when going out of town.

## 2016-08-10 ENCOUNTER — Other Ambulatory Visit: Payer: Self-pay | Admitting: Internal Medicine

## 2016-08-11 ENCOUNTER — Ambulatory Visit
Admission: RE | Admit: 2016-08-11 | Discharge: 2016-08-11 | Disposition: A | Discharge status: Home / Self Care | Payer: PPO | Source: Ambulatory Visit | Attending: Internal Medicine | Admitting: Internal Medicine

## 2016-08-11 ENCOUNTER — Other Ambulatory Visit: Payer: Self-pay | Admitting: Internal Medicine

## 2016-08-11 DIAGNOSIS — R928 Other abnormal and inconclusive findings on diagnostic imaging of breast: Secondary | ICD-10-CM

## 2016-08-11 DIAGNOSIS — N6310 Unspecified lump in the right breast, unspecified quadrant: Secondary | ICD-10-CM

## 2016-08-11 DIAGNOSIS — N6320 Unspecified lump in the left breast, unspecified quadrant: Secondary | ICD-10-CM | POA: Insufficient documentation

## 2016-08-11 DIAGNOSIS — N63 Unspecified lump in unspecified breast: Secondary | ICD-10-CM | POA: Diagnosis not present

## 2016-08-11 DIAGNOSIS — N6313 Unspecified lump in the right breast, lower outer quadrant: Secondary | ICD-10-CM | POA: Insufficient documentation

## 2016-08-11 NOTE — Progress Notes (Signed)
Order placed for f/u right breast mammoram and possible ultrasound - 6 months - due 01/2017

## 2016-08-30 ENCOUNTER — Encounter: Payer: Self-pay | Admitting: Internal Medicine

## 2016-09-02 ENCOUNTER — Other Ambulatory Visit: Payer: Self-pay | Admitting: Internal Medicine

## 2016-09-02 NOTE — Telephone Encounter (Signed)
rx sent in for astelin.

## 2016-09-02 NOTE — Telephone Encounter (Signed)
Pt last refill on Nasal Spray on 02/15/16. Pt last OV was on 07/22/16 and last labs were on 04/25/16 and pt has future scheduled appt on 10/31/16. Ok to refill?

## 2016-09-16 ENCOUNTER — Other Ambulatory Visit: Payer: Self-pay | Admitting: Internal Medicine

## 2016-10-04 ENCOUNTER — Encounter: Payer: Self-pay | Admitting: Internal Medicine

## 2016-10-31 ENCOUNTER — Ambulatory Visit: Payer: PPO | Admitting: Internal Medicine

## 2016-11-09 ENCOUNTER — Encounter: Payer: Self-pay | Admitting: Internal Medicine

## 2016-11-09 DIAGNOSIS — R928 Other abnormal and inconclusive findings on diagnostic imaging of breast: Secondary | ICD-10-CM

## 2016-11-09 NOTE — Telephone Encounter (Signed)
Please call Amina (pts daughter-n-law) and let her know that we can keep the biopsy appt with radiology if desires, but I usually refer to surgery for evaluation and further w/up.  See previous note I sent.  Let me know if desires referral.  Also regarding Hartlyn's zoloft, if she just started 1/2 tablet four days ago, then continue 1/2 tablet q day for two weeks and send me an update.

## 2016-11-09 NOTE — Telephone Encounter (Signed)
Patient daughter in- law agreed thatcher mother in law should see a Psychologist, sport and exercise  and they do prefer Dr. Bary Castilla. Would like to schedule soon as possible.

## 2016-11-09 NOTE — Telephone Encounter (Signed)
Left message for patient to return call to office. 

## 2016-11-10 NOTE — Telephone Encounter (Signed)
Order placed for surgery referral.  

## 2016-11-15 DIAGNOSIS — L638 Other alopecia areata: Secondary | ICD-10-CM | POA: Diagnosis not present

## 2016-11-15 DIAGNOSIS — K130 Diseases of lips: Secondary | ICD-10-CM | POA: Diagnosis not present

## 2016-11-15 DIAGNOSIS — D692 Other nonthrombocytopenic purpura: Secondary | ICD-10-CM | POA: Diagnosis not present

## 2016-11-15 DIAGNOSIS — L603 Nail dystrophy: Secondary | ICD-10-CM | POA: Diagnosis not present

## 2016-11-24 DIAGNOSIS — H2513 Age-related nuclear cataract, bilateral: Secondary | ICD-10-CM | POA: Diagnosis not present

## 2016-11-28 ENCOUNTER — Other Ambulatory Visit: Payer: Self-pay | Admitting: Internal Medicine

## 2016-11-29 ENCOUNTER — Encounter: Payer: Self-pay | Admitting: Internal Medicine

## 2016-11-29 MED ORDER — SYNTHROID 150 MCG PO TABS: 150.0000 ug | ORAL_TABLET | Freq: Every day | ORAL | 2 refills | Status: DC

## 2016-11-29 MED ORDER — ESZOPICLONE 3 MG PO TABS: 3.0000 mg | ORAL_TABLET | Freq: Every day | ORAL | 1 refills | Status: DC

## 2016-11-29 NOTE — Telephone Encounter (Signed)
rx sent in for lunesta and synthroid

## 2016-12-20 ENCOUNTER — Other Ambulatory Visit: Payer: Self-pay | Admitting: Internal Medicine

## 2017-01-26 ENCOUNTER — Ambulatory Visit: Payer: PPO

## 2017-02-10 ENCOUNTER — Ambulatory Visit
Admission: RE | Admit: 2017-02-10 | Discharge: 2017-02-10 | Disposition: A | Discharge status: Home / Self Care | Payer: PPO | Source: Ambulatory Visit | Attending: Internal Medicine | Admitting: Internal Medicine

## 2017-02-10 ENCOUNTER — Other Ambulatory Visit: Payer: Self-pay | Admitting: Internal Medicine

## 2017-02-10 DIAGNOSIS — N6489 Other specified disorders of breast: Secondary | ICD-10-CM | POA: Diagnosis not present

## 2017-02-10 DIAGNOSIS — R928 Other abnormal and inconclusive findings on diagnostic imaging of breast: Secondary | ICD-10-CM | POA: Diagnosis not present

## 2017-02-12 ENCOUNTER — Other Ambulatory Visit: Payer: Self-pay | Admitting: Internal Medicine

## 2017-02-12 NOTE — Progress Notes (Signed)
Opened in error

## 2017-02-14 ENCOUNTER — Other Ambulatory Visit: Payer: Self-pay

## 2017-02-14 DIAGNOSIS — R928 Other abnormal and inconclusive findings on diagnostic imaging of breast: Secondary | ICD-10-CM

## 2017-02-17 ENCOUNTER — Ambulatory Visit (INDEPENDENT_AMBULATORY_CARE_PROVIDER_SITE_OTHER): Payer: PPO | Admitting: Internal Medicine

## 2017-02-17 ENCOUNTER — Encounter: Payer: Self-pay | Admitting: Internal Medicine

## 2017-02-17 ENCOUNTER — Ambulatory Visit (INDEPENDENT_AMBULATORY_CARE_PROVIDER_SITE_OTHER): Payer: PPO

## 2017-02-17 VITALS — BP 126/80 | HR 60 | Temp 98.6°F | Resp 12 | Ht 62.0 in | Wt 146.0 lb

## 2017-02-17 DIAGNOSIS — I10 Essential (primary) hypertension: Secondary | ICD-10-CM | POA: Diagnosis not present

## 2017-02-17 DIAGNOSIS — G479 Sleep disorder, unspecified: Secondary | ICD-10-CM | POA: Diagnosis not present

## 2017-02-17 DIAGNOSIS — E039 Hypothyroidism, unspecified: Secondary | ICD-10-CM | POA: Diagnosis not present

## 2017-02-17 DIAGNOSIS — Z0000 Encounter for general adult medical examination without abnormal findings: Secondary | ICD-10-CM | POA: Diagnosis not present

## 2017-02-17 DIAGNOSIS — E785 Hyperlipidemia, unspecified: Secondary | ICD-10-CM

## 2017-02-17 MED ORDER — ESZOPICLONE 3 MG PO TABS: 3.0000 mg | ORAL_TABLET | Freq: Every day | ORAL | 1 refills | Status: DC

## 2017-02-17 MED ORDER — SYNTHROID 150 MCG PO TABS: 150.0000 ug | ORAL_TABLET | Freq: Every day | ORAL | 2 refills | Status: DC

## 2017-02-17 NOTE — Progress Notes (Signed)
Patient ID: [name] [name], female   DOB: [birth year], 66 y.o.   MRN: [id number]   Subjective:    Patient ID: [name] [name], female    DOB: Jan 30, 1951, 66 y.o.   MRN: [id number]  HPI  Patient here for a scheduled follow up.  Increased stress with coping with her husband's death.  She is not exercising as much.  Plans to start back exercising.  No chest pain.  No sob.  No acid reflux.  No abdominal pain.  Bowels moving.  Overall she feels she is handling things relatively well.  Seeing grief counselor through hospice.  Just had her eyes checked.  [place] to see dermatology.     Past Medical History:  Diagnosis Date  . Adenomatous polyp 2005  . Arrhythmia    H/O  . Basal cell carcinoma   . CTS (carpal tunnel syndrome)   . Environmental allergies   . History of chicken pox   . HTN (hypertension)   . Hypercholesterolemia   . Hypothyroidism    Past Surgical History:  Procedure Laterality Date  . BASAL CELL CARCINOMA EXCISION     right hip,abd, right arm, back neck  . BREAST CYST ASPIRATION Right    negative  . CTS releast Rt wrist    . lipoma removal     Rt shoulder   . MELANOMA EXCISION     right buttocks  . POLYPECTOMY     + TA  . TUBAL LIGATION     Family History  Problem Relation Age of Onset  . Cervical cancer Mother   . Arthritis Mother   . Hypertension Mother   . Sudden death Mother        cerbral hemorrage  . Heart disease Father   . Cancer Father        lung cancer  . Diabetes Maternal Grandmother   . Breast cancer Paternal Aunt 74  . Colon cancer Neg Hx    Social History   Social History  . Marital status: Married    Spouse name: N/A  . Number of children: 1  . Years of education: N/A   Occupational History  . 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 Smoking status: Former Smoker    Types: Cigarettes    Quit date: 07/16/1980  . Smokeless tobacco: Never Used  . Alcohol use 4.2 oz/week    7 Glasses of wine  per week     Comment: 1 per day   . Drug use: No  . Sexual activity: No   Other Topics Concern  . None   Social History Narrative   Married; 1 son.    Dental assistant   Daily caffeine use 2-3/day       Cell # [id number]    Outpatient Encounter Prescriptions as of 02/17/2017  Medication Sig  . ammonium lactate (GERI-HYDROLAC 5) 5 % LOTN lotion Apply 1 application topically 2 (two) times daily.  Marland Kitchen azelastine (ASTELIN) 0.1 % nasal spray PLACE 1 SPRAY INTO BOTH NOSTRIL TWICE DAILY. USE AS DIRECTED.  Marland Kitchen BIOTIN PO Take 500 mg by mouth daily.  . cetirizine (ZYRTEC) 10 MG tablet Take 10 mg by mouth daily.  . cholecalciferol (VITAMIN D) 1000 units tablet Take 1,000 Units by mouth daily.  . clobetasol (TEMOVATE) 0.05 % external solution Apply 1 application topically daily.  . Eszopiclone 3 MG TABS Take 1 tablet (3 mg total) by mouth at bedtime. Take immediately before bedtime  .  fluticasone (FLONASE) 50 MCG/ACT nasal spray USE 2 SPRAYS IN EACH NOSTRIL EVERY DAY  . irbesartan (AVAPRO) 75 MG tablet TAKE 1/2-1 TABLET BY MOUTH DAILY AS DIRECTED  . Nutritional Supplements (NUTRITIONAL SUPPLEMENT PO) Take by mouth. DoTERRA: ALPHA CRS+, xEO MEGA, MICROPLEX MVp, ON GUARD (protective blend)  . ranitidine (ZANTAC) 150 MG tablet Take by mouth 2 (two) times daily.   Marland Kitchen SYNTHROID 150 MCG tablet Take 1 tablet (150 mcg total) by mouth daily.  . [DISCONTINUED] Eszopiclone 3 MG TABS Take 1 tablet (3 mg total) by mouth at bedtime. Take immediately before bedtime  . [DISCONTINUED] LORazepam (ATIVAN) 0.5 MG tablet Take 1/2-1 tablet bid prn  . [DISCONTINUED] SYNTHROID 150 MCG tablet Take 1 tablet (150 mcg total) by mouth daily.  . [DISCONTINUED] sertraline (ZOLOFT) 50 MG tablet TAKE 1 TABLET BY MOUTH DAILY   No facility-administered encounter medications on file as of 02/17/2017.     Review of Systems  Constitutional: Negative for appetite change and unexpected weight change.  HENT: Negative for congestion and  sinus pressure.   Respiratory: Negative for cough, chest tightness and shortness of breath.   Cardiovascular: Negative for chest pain, palpitations and leg swelling.  Gastrointestinal: Negative for abdominal pain, diarrhea, nausea and vomiting.  Genitourinary: Negative for difficulty urinating and dysuria.  Musculoskeletal: Negative for back pain and joint swelling.  Skin: Negative for color change and rash.  Neurological: Negative for dizziness, light-headedness and headaches.  Psychiatric/Behavioral: Negative for agitation and dysphoric mood.       Objective:    Physical Exam  Constitutional: She appears well-developed and well-nourished. No distress.  HENT:  Nose: Nose normal.  Mouth/Throat: Oropharynx is clear and moist.  Neck: Neck supple. No thyromegaly present.  Cardiovascular: Normal rate and regular rhythm.   Pulmonary/Chest: Breath sounds normal. No respiratory distress. She has no wheezes.  Abdominal: Soft. Bowel sounds are normal. There is no tenderness.  Musculoskeletal: She exhibits no edema or tenderness.  Lymphadenopathy:    She has no cervical adenopathy.  Skin: No rash noted. No erythema.  Psychiatric: She has a normal mood and affect. Her behavior is normal.    BP 126/80 (BP Location: Left Arm, Patient Position: Sitting, Cuff Size: Normal)   Pulse 60   Temp 98.6 F (37 C) (Oral)   Resp 12   Ht 5\' 2"  (1.575 m)   Wt 146 lb 6.4 oz (66.4 kg)   SpO2 99%   BMI 26.78 kg/m  Wt Readings from Last 3 Encounters:  02/17/17 146 lb (66.2 kg)  02/17/17 146 lb 6.4 oz (66.4 kg)  07/22/16 140 lb 6.4 oz (63.7 kg)     Lab Results  Component Value Date   WBC 5.8 04/11/2016   HGB 14.2 04/11/2016   HCT 41.0 04/11/2016   PLT 256.0 04/11/2016   GLUCOSE 95 04/25/2016   CHOL 220 (H) 04/11/2016   TRIG 93.0 04/11/2016   HDL 77.70 04/11/2016   LDLDIRECT 118.2 10/16/2006   LDLCALC 124 (H) 04/11/2016   ALT 28 04/11/2016   AST 21 04/11/2016   NA 140 04/25/2016   K 4.7  04/25/2016   CL 106 04/25/2016   CREATININE 0.67 04/25/2016   BUN 17 04/25/2016   CO2 29 04/25/2016   TSH 2.25 04/11/2016    US Breast Ltd Uni Right Inc Axilla  Result Date: 02/10/2017 CLINICAL DATA:  Six month follow-up of probably benign right breast mass. EXAM: DIGITAL DIAGNOSTIC RIGHT MAMMOGRAM WITH CAD ULTRASOUND RIGHT BREAST COMPARISON:  08/11/2016, 07/21/2016, 07/14/2015, 06/20/2014,  05/31/2013, 06/08/2012 ACR Breast Density Category c: The breast tissue is heterogeneously dense, which may obscure small masses. FINDINGS: The parenchymal pattern of the right breast is stable. A partially circumscribed and partially obscured mass in the posterior third of the breast parenchyma near the junction with the retroglandular fat in the lower outer quadrant of the right breast is mammographically stable. No new or suspicious mass, architectural distortion, or suspicious microcalcification is identified. Mammographic images were processed with CAD. On physical exam, I do not definitely palpate a lump in the lower outer quadrant of the right breast. Targeted ultrasound is performed, showing an oval hypoechoic mass oriented parallel to the chest wall overlying the pectoralis muscle at 7:30 position 3 cm from the nipple measuring 1.2 x 0.5 x 0.9 cm. There is no internal vascular flow. 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 No new or suspicious findings in the right breast. RECOMMENDATION: Bilateral diagnostic mammogram and right breast ultrasound is recommended in December 2018. I have discussed the findings and recommendations with the patient. Results were also provided in writing at the conclusion of the visit. If applicable, a reminder letter will be sent to the patient regarding the next appointment. BI-RADS CATEGORY  3: Probably benign. Electronically Signed   By:  Curlene Dolphin M.D.   On: 02/10/2017 15:18   Mm Diag Breast Tomo Uni Right  Result Date: 02/10/2017 CLINICAL DATA:  Six month follow-up of probably benign right breast mass. EXAM: DIGITAL DIAGNOSTIC RIGHT MAMMOGRAM WITH CAD ULTRASOUND RIGHT BREAST COMPARISON:  08/11/2016, 07/21/2016, 07/14/2015, 06/20/2014, 05/31/2013, 06/08/2012 ACR Breast Density Category c: The breast tissue is heterogeneously dense, which may obscure small masses. FINDINGS: The parenchymal pattern of the right breast is stable. A partially circumscribed and partially obscured mass in the posterior third of the breast parenchyma near the junction with the retroglandular fat in the lower outer quadrant of the right breast is mammographically stable. No new or suspicious mass, architectural distortion, or suspicious microcalcification is identified. Mammographic images were processed with CAD. On physical exam, I do not definitely palpate a lump in the lower outer quadrant of the right breast. Targeted ultrasound is performed, showing an oval hypoechoic mass oriented parallel to the chest wall overlying the pectoralis muscle at 7:30 position 3 cm from the nipple measuring 1.2 x 0.5 x 0.9 cm. There is no internal vascular flow. 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 No new or suspicious findings in the right breast. RECOMMENDATION: Bilateral diagnostic mammogram and right breast ultrasound is recommended in December 2018. I have discussed the findings and recommendations with the patient. Results were also provided in writing at the conclusion of the visit. If applicable, a reminder letter will be sent to the patient regarding the next appointment. BI-RADS CATEGORY  3: Probably benign. Electronically Signed   By: Curlene Dolphin M.D.   On: 02/10/2017 15:18       Assessment & Plan:   Problem List  Items Addressed This Visit    Essential hypertension    Blood pressure under good control.  Continue same medication regimen.  Follow pressures.  Follow metabolic panel.  Blood pressure rechecked by me:  132/78      Relevant Orders   TSH   CBC with  Differential/Platelet   Hyperlipidemia    Low cholesterol diet and exercise.  Follow lipid panel.   Lab Results  Component Value Date   CHOL 220 (H) 04/11/2016   HDL 77.70 04/11/2016   LDLCALC 124 (H) 04/11/2016   LDLDIRECT 118.2 10/16/2006   TRIG 93.0 04/11/2016   CHOLHDL 3 04/11/2016        Hypothyroidism    On thyroid replacement.  Follow tsh.        Relevant Medications   SYNTHROID 150 MCG tablet   Sleeping difficulty    Taking lunesta.  Overall relatively stable.            Einar Pheasant, MD

## 2017-02-17 NOTE — Progress Notes (Signed)
Subjective:   [name] [name] is a 66 y.o. female who presents for Medicare Annual (Subsequent) preventive examination.  Review of Systems:  No ROS.  Medicare Wellness Visit. Additional risk factors are reflected in the social history.  Cardiac Risk Factors include: advanced age (>70men, >54 women);hypertension     Objective:     Vitals: BP 126/80 (BP Location: Left Arm, Patient Position: Sitting, Cuff Size: Normal)   Pulse 60   Temp 98.6 F (37 C) (Oral)   Resp 12   Ht 5\' 2"  (1.575 m)   Wt 146 lb (66.2 kg)   SpO2 99%   BMI 26.70 kg/m   Body mass index is 26.7 kg/m.   Tobacco History  Smoking Status  . Former Smoker  . Types: Cigarettes  . Quit date: 07/16/1980  Smokeless Tobacco  . Never Used     Counseling given: Not Answered   Past Medical History:  Diagnosis Date  . Adenomatous polyp 2005  . Arrhythmia    H/O  . Basal cell carcinoma   . CTS (carpal tunnel syndrome)   . Environmental allergies   . History of chicken pox   . HTN (hypertension)   . Hypercholesterolemia   . Hypothyroidism    Past Surgical History:  Procedure Laterality Date  . BASAL CELL CARCINOMA EXCISION     right hip,abd, right arm, back neck  . BREAST CYST ASPIRATION Right    negative  . CTS releast Rt wrist    . lipoma removal     Rt shoulder   . MELANOMA EXCISION     right buttocks  . POLYPECTOMY     + TA  . TUBAL LIGATION     Family History  Problem Relation Age of Onset  . Cervical cancer Mother   . Arthritis Mother   . Hypertension Mother   . Sudden death Mother        cerbral hemorrage  . Heart disease Father   . Cancer Father        lung cancer  . Diabetes Maternal Grandmother   . Breast cancer Paternal Aunt 74  . Colon cancer Neg Hx    History  Sexual Activity  . Sexual activity: No    Outpatient Encounter Prescriptions as of 02/17/2017  Medication Sig  . ammonium lactate (GERI-HYDROLAC 5) 5 % LOTN lotion Apply 1 application topically 2 (two) times  daily.  Marland Kitchen azelastine (ASTELIN) 0.1 % nasal spray PLACE 1 SPRAY INTO BOTH NOSTRIL TWICE DAILY. USE AS DIRECTED.  Marland Kitchen BIOTIN PO Take 500 mg by mouth daily.  . cetirizine (ZYRTEC) 10 MG tablet Take 10 mg by mouth daily.  . cholecalciferol (VITAMIN D) 1000 units tablet Take 1,000 Units by mouth daily.  . clobetasol (TEMOVATE) 0.05 % external solution Apply 1 application topically daily.  . Eszopiclone 3 MG TABS Take 1 tablet (3 mg total) by mouth at bedtime. Take immediately before bedtime  . fluticasone (FLONASE) 50 MCG/ACT nasal spray USE 2 SPRAYS IN EACH NOSTRIL EVERY DAY  . irbesartan (AVAPRO) 75 MG tablet TAKE 1/2-1 TABLET BY MOUTH DAILY AS DIRECTED  . Nutritional Supplements (NUTRITIONAL SUPPLEMENT PO) Take by mouth. DoTERRA: ALPHA CRS+, xEO MEGA, MICROPLEX MVp, ON GUARD (protective blend)  . ranitidine (ZANTAC) 150 MG tablet Take by mouth 2 (two) times daily.   Marland Kitchen SYNTHROID 150 MCG tablet Take 1 tablet (150 mcg total) by mouth daily.  . [DISCONTINUED] Eszopiclone 3 MG TABS Take 1 tablet (3 mg total) by mouth at bedtime.  Take immediately before bedtime  . [DISCONTINUED] SYNTHROID 150 MCG tablet Take 1 tablet (150 mcg total) by mouth daily.   No facility-administered encounter medications on file as of 02/17/2017.     Activities of Daily Living In your present state of health, do you have any difficulty performing the following activities: 02/17/2017  Hearing? N  Vision? N  Difficulty concentrating or making decisions? N  Walking or climbing stairs? N  Dressing or bathing? N  Doing errands, shopping? N  Preparing Food and eating ? N  Using the Toilet? N  In the past six months, have you accidently leaked urine? N  Do you have problems with loss of bowel control? N  Managing your Medications? N  Managing your Finances? N  Housekeeping or managing your Housekeeping? N  Some recent data might be hidden    Patient Care Team: Einar Pheasant, MD as PCP - General (Internal Medicine)      Assessment:    This is a routine wellness examination for [name]. The goal of the wellness visit is to assist the patient how to close the gaps in care and create a preventative care plan for the patient.   The roster of all physicians providing medical care to patient is listed in the Snapshot section of the chart.  Taking calcium VIT D as appropriate/Osteoporosis risk reviewed.    Safety issues reviewed; Alarm, smoke and carbon monoxide detectors in the home. Firearms locked up in the home. Wears seatbelts when driving or riding with others. Patient does wear sunscreen or protective clothing when in direct sunlight. No violence in the home.  Patient is alert, normal appearance, oriented to person/place/and time.  Correctly identified the president of the [place], recall of 3/3 words, and performing simple calculations. Displays appropriate judgement and can read correct time from watch face.   No new identified risk were noted.  No failures at ADL's or IADL's.    BMI- discussed the importance of a healthy diet, water intake and the benefits of aerobic exercise. Educational material provided.   Dental- every 6 months.  Eye- Visual acuity not assessed per patient preference since they have regular follow up with the ophthalmologist.  Wears corrective lenses.  Sleep patterns- Sleeps 6-7 hours at night.  Wakes feeling rested.  Naps as needed.  Health maintenance gaps- closed.  Patient Concerns: None at this time. Follow up with PCP as needed.  Exercise Activities and Dietary recommendations Current Exercise Habits: Home exercise routine, Type of exercise: strength training/weights;walking, Time (Minutes): 45, Frequency (Times/Week): 2, Weekly Exercise (Minutes/Week): 90, Intensity: Moderate  Goals    . Increase physical activity          Continue to walk for exercise Increase fit-bit steps up to 10,000 Practice self defense exercises      Fall Risk Fall Risk  02/17/2017  07/22/2016 06/08/2016 06/25/2015 10/30/2013  Falls in the past year? No No No No No   Depression Screen PHQ 2/9 Scores 02/17/2017 07/22/2016 06/08/2016 06/25/2015  PHQ - 2 Score - 0 0 0  Exception Documentation Other- indicate reason in comment box - - -  Not completed Recent loss of spouse.  Currently in grief counseling.  Followed by PCP. - - -     Cognitive Function MMSE - Mini Mental State Exam 02/17/2017  Orientation to time 5  Orientation to Place 5  Registration 3  Attention/ Calculation 5  Recall 3  Language- name 2 objects 2  Language- repeat 1  Language- follow 3  step command 3  Language- read & follow direction 1  Write a sentence 1  Copy design 1  Total score 30        Immunization History  Administered Date(s) Administered  . Influenza Split 05/06/2014  . Influenza, High Dose Seasonal PF 04/14/2016  . Influenza,inj,Quad PF,36+ Mos 05/13/2015  . Pneumococcal Conjugate-13 06/20/2014  . Pneumococcal Polysaccharide-23 06/25/2015  . Tdap 05/20/2011  . Zoster 09/20/2013   Screening Tests Health Maintenance  Topic Date Due  . INFLUENZA VACCINE  03/15/2017  . MAMMOGRAM  08/11/2017  . TETANUS/TDAP  05/19/2021  . COLONOSCOPY  07/27/2023  . DEXA SCAN  Completed  . Hepatitis C Screening  Completed  . PNA vac Low Risk Adult  Completed      Plan:    End of life planning; Advance aging; Advanced directives discussed. Copy of current HCPOA/Living Will requested.    I have personally reviewed and noted the following in the patient's chart:   . Medical and social history . Use of alcohol, tobacco or illicit drugs  . Current medications and supplements . Functional ability and status . Nutritional status . Physical activity . Advanced directives . List of other physicians . Hospitalizations, surgeries, and ER visits in previous 12 months . Vitals . Screenings to include cognitive, depression, and falls . Referrals and appointments  In addition, I have reviewed  and discussed with patient certain preventive protocols, quality metrics, and best practice recommendations. A written personalized care plan for preventive services as well as general preventive health recommendations were provided to patient.     OBrien-Blaney, Stephaniemarie Stoffel L, LPN  01/21/320   Reviewed above information. Agree with assessment and plan.  Dr Nicki Reaper

## 2017-02-17 NOTE — Patient Instructions (Addendum)
  [name] [name] , Thank you for taking time to come for your Medicare Wellness Visit. I appreciate your ongoing commitment to your health goals. Please review the following plan we discussed and let me know if I can assist you in the future.   Follow up with Dr. Nicki Reaper as needed.    Bring a copy of your [place] and/or Living Will to be scanned into chart.  Have a great day!  These are the goals we discussed: Goals    . Increase physical activity          Continue to walk for exercise Increase fit-bit steps up to 10,000 Practice self defense exercises       This is a list of the screening recommended for you and due dates:  Health Maintenance  Topic Date Due  . Flu Shot  03/15/2017  . Mammogram  08/11/2017  . Tetanus Vaccine  05/19/2021  . Colon Cancer Screening  07/27/2023  . DEXA scan (bone density measurement)  Completed  .  Hepatitis C: One time screening is recommended by Center for Disease Control  (CDC) for  adults born from [birth year] through 1965.   Completed  . Pneumonia vaccines  Completed

## 2017-02-17 NOTE — Progress Notes (Signed)
Pre-visit discussion using our clinic review tool. No additional management support is needed unless otherwise documented below in the visit note.  

## 2017-02-19 ENCOUNTER — Encounter: Payer: Self-pay | Admitting: Internal Medicine

## 2017-02-19 NOTE — Assessment & Plan Note (Signed)
Taking lunesta.  Overall relatively stable.

## 2017-02-19 NOTE — Assessment & Plan Note (Signed)
Blood pressure under good control.  Continue same medication regimen.  Follow pressures.  Follow metabolic panel.  Blood pressure rechecked by me:  132/78

## 2017-02-19 NOTE — Assessment & Plan Note (Signed)
On thyroid replacement.  Follow tsh.  

## 2017-02-19 NOTE — Assessment & Plan Note (Signed)
Low cholesterol diet and exercise.  Follow lipid panel.   Lab Results  Component Value Date   CHOL 220 (H) 04/11/2016   HDL 77.70 04/11/2016   LDLCALC 124 (H) 04/11/2016   LDLDIRECT 118.2 10/16/2006   TRIG 93.0 04/11/2016   CHOLHDL 3 04/11/2016

## 2017-02-23 DIAGNOSIS — Z85820 Personal history of malignant melanoma of skin: Secondary | ICD-10-CM | POA: Diagnosis not present

## 2017-02-23 DIAGNOSIS — L821 Other seborrheic keratosis: Secondary | ICD-10-CM | POA: Diagnosis not present

## 2017-02-23 DIAGNOSIS — L853 Xerosis cutis: Secondary | ICD-10-CM | POA: Diagnosis not present

## 2017-02-23 DIAGNOSIS — Z859 Personal history of malignant neoplasm, unspecified: Secondary | ICD-10-CM | POA: Diagnosis not present

## 2017-02-23 DIAGNOSIS — Z85828 Personal history of other malignant neoplasm of skin: Secondary | ICD-10-CM | POA: Diagnosis not present

## 2017-03-01 ENCOUNTER — Other Ambulatory Visit (INDEPENDENT_AMBULATORY_CARE_PROVIDER_SITE_OTHER): Payer: PPO

## 2017-03-01 DIAGNOSIS — E785 Hyperlipidemia, unspecified: Secondary | ICD-10-CM | POA: Diagnosis not present

## 2017-03-01 DIAGNOSIS — I10 Essential (primary) hypertension: Secondary | ICD-10-CM

## 2017-03-01 LAB — LIPID PANEL
Cholesterol: 192 mg/dL (ref 0–200)
HDL: 75.9 mg/dL (ref >39.00)
LDL Cholesterol: 102 mg/dL — ABNORMAL HIGH (ref 0–99)
NonHDL: 116.4
Total CHOL/HDL Ratio: 3
Triglycerides: 73 mg/dL (ref 0.0–149.0)
VLDL: 14.6 mg/dL (ref 0.0–40.0)

## 2017-03-01 LAB — BASIC METABOLIC PANEL
BUN: 15 mg/dL (ref 6–23)
CALCIUM: 9.4 mg/dL (ref 8.4–10.5)
CO2: 30 mEq/L (ref 19–32)
Chloride: 105 mEq/L (ref 96–112)
Creatinine, Ser: 0.75 mg/dL (ref 0.40–1.20)
GFR: 81.75 mL/min (ref >60.00)
Glucose, Bld: 101 mg/dL — ABNORMAL HIGH (ref 70–99)
POTASSIUM: 4.8 meq/L (ref 3.5–5.1)
SODIUM: 139 meq/L (ref 135–145)

## 2017-03-01 LAB — CBC WITH DIFFERENTIAL/PLATELET
BASOS ABS: 0 10*3/uL (ref 0.0–0.1)
Basophils Relative: 0.6 % (ref 0.0–3.0)
EOS ABS: 0.3 10*3/uL (ref 0.0–0.7)
Eosinophils Relative: 4.9 % (ref 0.0–5.0)
HEMATOCRIT: 40.5 % (ref 36.0–46.0)
Hemoglobin: 13.7 g/dL (ref 12.0–15.0)
LYMPHS ABS: 1.5 10*3/uL (ref 0.7–4.0)
LYMPHS PCT: 24.8 % (ref 12.0–46.0)
MCHC: 34 g/dL (ref 30.0–36.0)
MCV: 89.2 fl (ref 78.0–100.0)
Monocytes Absolute: 0.5 10*3/uL (ref 0.1–1.0)
Monocytes Relative: 7.6 % (ref 3.0–12.0)
NEUTROS PCT: 62.1 % (ref 43.0–77.0)
Neutro Abs: 3.7 10*3/uL (ref 1.4–7.7)
PLATELETS: 260 10*3/uL (ref 150.0–400.0)
RBC: 4.53 Mil/uL (ref 3.87–5.11)
RDW: 13 % (ref 11.5–15.5)
WBC: 6 10*3/uL (ref 4.0–10.5)

## 2017-03-01 LAB — HEPATIC FUNCTION PANEL
ALK PHOS: 65 U/L (ref 39–117)
ALT: 16 U/L (ref 0–35)
AST: 16 U/L (ref 0–37)
Albumin: 4.4 g/dL (ref 3.5–5.2)
BILIRUBIN DIRECT: 0.1 mg/dL (ref 0.0–0.3)
Total Bilirubin: 0.5 mg/dL (ref 0.2–1.2)
Total Protein: 6.2 g/dL (ref 6.0–8.3)

## 2017-03-01 LAB — TSH: TSH: 3.68 u[IU]/mL (ref 0.35–4.50)

## 2017-03-02 ENCOUNTER — Encounter: Payer: Self-pay | Admitting: Internal Medicine

## 2017-03-27 ENCOUNTER — Other Ambulatory Visit: Payer: Self-pay | Admitting: Internal Medicine

## 2017-03-30 ENCOUNTER — Other Ambulatory Visit: Payer: Self-pay | Admitting: Internal Medicine

## 2017-04-24 ENCOUNTER — Encounter: Payer: Self-pay | Admitting: Family Medicine

## 2017-04-24 ENCOUNTER — Ambulatory Visit (INDEPENDENT_AMBULATORY_CARE_PROVIDER_SITE_OTHER): Payer: PPO | Admitting: Family Medicine

## 2017-04-24 DIAGNOSIS — B9789 Other viral agents as the cause of diseases classified elsewhere: Secondary | ICD-10-CM

## 2017-04-24 DIAGNOSIS — J069 Acute upper respiratory infection, unspecified: Secondary | ICD-10-CM | POA: Diagnosis not present

## 2017-04-24 NOTE — Assessment & Plan Note (Signed)
New acute (uncomplicated) illness. Well-appearing. Exam benign. Likely viral. Reassurance provided. I suggested use of Flonase, antihistamine, OTC Delsym.

## 2017-04-24 NOTE — Patient Instructions (Signed)
Let it take its course.  Flonase and an antihistamine. Delsym as needed.  Call if you fail to improve or worsen.  Take care  Dr. Lacinda Axon

## 2017-04-24 NOTE — Progress Notes (Signed)
Subjective:  Patient ID: [name] [name], female    DOB: 21-Mar-1951  Age: 66 y.o. MRN: [id number]  CC: Cough, URI symptoms.  HPI:  66 year old female presents with the above complaints.  Patient states that she's been sick for the past week. She's had sinus pressure, congestion, productive cough. No purulent nasal discharge. No reported fever. She's been using ibuprofen as needed. She's had little to no improvement. She states that she has been about the same and doesn't appear to be worsening. No reports of shortness of breath. No wheezing. No other medications or interventions tried. Symptoms are moderate in severity. No other complaints or concerns at this time.  Social Hx   Social History   Social History  . Marital status: Married    Spouse name: N/A  . Number of children: 1  . Years of education: N/A   Occupational History  . 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 Smoking status: Former Smoker    Types: Cigarettes    Quit date: 07/16/1980  . Smokeless tobacco: Never Used  . Alcohol use 4.2 oz/week    7 Glasses of wine per week     Comment: 1 per day   . Drug use: No  . Sexual activity: No   Other Topics Concern  . None   Social History Narrative   Married; 1 son.    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  HENT: Positive for congestion and sinus pressure.   Respiratory: Positive for cough. Negative for shortness of breath and wheezing.    Objective:  BP 118/80 (BP Location: Left Arm, Patient Position: Sitting, Cuff Size: Normal)   Pulse 62   Temp 98.3 F (36.8 C) (Oral)   Wt 150 lb 2 oz (68.1 kg)   SpO2 98%   BMI 27.46 kg/m   BP/Weight 04/24/2017 0/12/3974 02/15/4192  Systolic BP 790 240 973  Diastolic BP 80 80 80  Wt. (Lbs) 150.13 146 146.4  BMI 27.46 26.7 26.78    Physical Exam  Constitutional: She is oriented to person,  place, and time. She appears well-developed. No distress.  HENT:  Head: Normocephalic and atraumatic.  Mouth/Throat: Oropharynx is clear and moist.  Mild maxillary sinus tenderness to palpation.  Eyes: Conjunctivae are normal. No scleral icterus.  Neck: Neck supple.  Cardiovascular: Normal rate and regular rhythm.   No murmur heard. Pulmonary/Chest: Effort normal. She has no rales.  Lymphadenopathy:    She has no cervical adenopathy.  Neurological: She is alert and oriented to person, place, and time.  Psychiatric: She has a normal mood and affect.  Vitals reviewed.   Lab Results  Component Value Date   WBC 6.0 03/01/2017   HGB 13.7 03/01/2017   HCT 40.5 03/01/2017   PLT 260.0 03/01/2017   GLUCOSE 101 (H) 03/01/2017   CHOL 192 03/01/2017   TRIG 73.0 03/01/2017   HDL 75.90 03/01/2017   LDLDIRECT 118.2 10/16/2006   LDLCALC 102 (H) 03/01/2017   ALT 16 03/01/2017   AST 16 03/01/2017   NA 139 03/01/2017   K 4.8 03/01/2017   CL 105 03/01/2017   CREATININE 0.75 03/01/2017   BUN 15 03/01/2017   CO2 30 03/01/2017   TSH 3.68 03/01/2017    Assessment & Plan:   Problem List Items Addressed This Visit    Viral URI  with cough    New acute (uncomplicated) illness. Well-appearing. Exam benign. Likely viral. Reassurance provided. I suggested use of Flonase, antihistamine, OTC Delsym.        Follow-up: PRN  [place]

## 2017-04-27 ENCOUNTER — Other Ambulatory Visit: Payer: Self-pay | Admitting: Internal Medicine

## 2017-05-04 ENCOUNTER — Encounter: Payer: Self-pay | Admitting: Internal Medicine

## 2017-05-04 NOTE — Telephone Encounter (Signed)
If she is having persistent symptoms, needs to be evaluated.  I can see her tomorrow at 8:30.  Please call for work in appt.

## 2017-05-05 ENCOUNTER — Encounter: Payer: Self-pay | Admitting: Internal Medicine

## 2017-05-05 ENCOUNTER — Ambulatory Visit (INDEPENDENT_AMBULATORY_CARE_PROVIDER_SITE_OTHER): Payer: PPO | Admitting: Internal Medicine

## 2017-05-05 DIAGNOSIS — J329 Chronic sinusitis, unspecified: Secondary | ICD-10-CM | POA: Diagnosis not present

## 2017-05-05 DIAGNOSIS — F419 Anxiety disorder, unspecified: Secondary | ICD-10-CM

## 2017-05-05 DIAGNOSIS — I10 Essential (primary) hypertension: Secondary | ICD-10-CM

## 2017-05-05 MED ORDER — AMOXICILLIN 875 MG PO TABS: 875.0000 mg | ORAL_TABLET | Freq: Two times a day (BID) | ORAL | 0 refills | Status: DC

## 2017-05-05 NOTE — Progress Notes (Signed)
Patient ID: [name] [name], female   DOB: 12-Dec-1950, 66 y.o.   MRN: [id number]   Subjective:    Patient ID: [name] [name], female    DOB: 07-Mar-1951, 66 y.o.   MRN: [id number]  HPI  Patient here as a work in with persistent cough.  Was initially seen on 04/24/17 with cough and congestion and increased sinus pressure.  Diagnosed with URI.  Felt to be viral.  Has been using otc meds.  No better.  Symptoms now present for three weeks.  Increased cough.  Some body aches.  Increased nasal congestion and sinus congestion.  Yellow/green mucus production.  Increased drainage.  Taking ibuprofen and zyrtec.  Using flonase.  No chest congestion.  No vomiting or diarrhea.     Past Medical History:  Diagnosis Date  . Adenomatous polyp 2005  . Arrhythmia    H/O  . Basal cell carcinoma   . CTS (carpal tunnel syndrome)   . Environmental allergies   . History of chicken pox   . HTN (hypertension)   . Hypercholesterolemia   . Hypothyroidism    Past Surgical History:  Procedure Laterality Date  . BASAL CELL CARCINOMA EXCISION     right hip,abd, right arm, back neck  . BREAST CYST ASPIRATION Right    negative  . CTS releast Rt wrist    . lipoma removal     Rt shoulder   . MELANOMA EXCISION     right buttocks  . POLYPECTOMY     + TA  . TUBAL LIGATION     Family History  Problem Relation Age of Onset  . Cervical cancer Mother   . Arthritis Mother   . Hypertension Mother   . Sudden death Mother        cerbral hemorrage  . Heart disease Father   . Cancer Father        lung cancer  . Diabetes Maternal Grandmother   . Breast cancer Paternal Aunt 74  . Colon cancer Neg Hx    Social History   Social History  . Marital status: Married    Spouse name: N/A  . Number of children: 1  . Years of education: N/A   Occupational History  . 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 Smoking status: Former Smoker    Types: Cigarettes    Quit date: 07/16/1980    . Smokeless tobacco: Never Used  . Alcohol use 4.2 oz/week    7 Glasses of wine per week     Comment: 1 per day   . Drug use: No  . Sexual activity: No   Other Topics Concern  . None   Social History Narrative   Married; 1 son.    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 ammonium lactate (GERI-HYDROLAC 5) 5 % LOTN lotion Apply 1 application topically 2 (two) times daily.  Marland Kitchen azelastine (ASTELIN) 0.1 % nasal spray PLACE 1 SPRAY INTO BOTH NOSTRIL TWICE DAILY. USE AS DIRECTED.  Marland Kitchen BIOTIN PO Take 500 mg by mouth daily.  . cetirizine (ZYRTEC) 10 MG tablet Take 10 mg by mouth daily.  . cholecalciferol (VITAMIN D) 1000 units tablet Take 1,000 Units by mouth daily.  . clobetasol (TEMOVATE) 0.05 % external solution Apply 1 application topically daily.  . Eszopiclone 3 MG TABS Take 1 tablet (  3 mg total) by mouth at bedtime. Take immediately before bedtime  . fluticasone (FLONASE) 50 MCG/ACT nasal spray USE 2 SPRAYS IN EACH NOSTRIL EVERY DAY  . irbesartan (AVAPRO) 75 MG tablet TAKE 1/2 TO 1 TABLET DAILY AS DIRECTED  . Nutritional Supplements (NUTRITIONAL SUPPLEMENT PO) Take by mouth. DoTERRA: ALPHA CRS+, xEO MEGA, MICROPLEX MVp, ON GUARD (protective blend)  . ranitidine (ZANTAC) 150 MG tablet Take by mouth 2 (two) times daily.   Marland Kitchen SYNTHROID 150 MCG tablet Take 1 tablet (150 mcg total) by mouth daily.  Marland Kitchen amoxicillin (AMOXIL) 875 MG tablet Take 1 tablet (875 mg total) by mouth 2 (two) times daily.   No facility-administered encounter medications on file as of 05/05/2017.     Review of Systems  Constitutional: Negative for appetite change and unexpected weight change.  HENT: Positive for congestion and sinus pressure.   Respiratory: Positive for cough. Negative for chest tightness and shortness of breath.   Cardiovascular: Negative for chest pain, palpitations and leg swelling.   Gastrointestinal: Negative for abdominal pain, diarrhea, nausea and vomiting.  Skin: Negative for color change and rash.  Neurological: Negative for dizziness, light-headedness and headaches.  Psychiatric/Behavioral: Negative for agitation and dysphoric mood.       Objective:    Physical Exam  Constitutional: She appears well-developed and well-nourished. No distress.  HENT:  Mouth/Throat: Oropharynx is clear and moist.  Minimal tenderness to palpation over the sinuses.  Nares - slightly erythematous turbinates.  TMs - no erythema.    Neck: Neck supple.  Cardiovascular: Normal rate and regular rhythm.   Pulmonary/Chest: Breath sounds normal. No respiratory distress. She has no wheezes.  Abdominal: Bowel sounds are normal.  Lymphadenopathy:    She has no cervical adenopathy.  Skin: No rash noted. No erythema.  Psychiatric: She has a normal mood and affect. Her behavior is normal.    BP 126/78 (BP Location: Left Arm, Patient Position: Sitting, Cuff Size: Normal)   Pulse 61   Temp 98.6 F (37 C) (Oral)   Resp 14   Wt 149 lb (67.6 kg)   SpO2 97%   BMI 27.25 kg/m  Wt Readings from Last 3 Encounters:  05/05/17 149 lb (67.6 kg)  04/24/17 150 lb 2 oz (68.1 kg)  02/17/17 146 lb (66.2 kg)     Lab Results  Component Value Date   WBC 6.0 03/01/2017   HGB 13.7 03/01/2017   HCT 40.5 03/01/2017   PLT 260.0 03/01/2017   GLUCOSE 101 (H) 03/01/2017   CHOL 192 03/01/2017   TRIG 73.0 03/01/2017   HDL 75.90 03/01/2017   LDLDIRECT 118.2 10/16/2006   LDLCALC 102 (H) 03/01/2017   ALT 16 03/01/2017   AST 16 03/01/2017   NA 139 03/01/2017   K 4.8 03/01/2017   CL 105 03/01/2017   CREATININE 0.75 03/01/2017   BUN 15 03/01/2017   CO2 30 03/01/2017   TSH 3.68 03/01/2017    US Breast Ltd Uni Right Inc Axilla  Result Date: 02/10/2017 CLINICAL DATA:  Six month follow-up of probably benign right breast mass. EXAM: DIGITAL DIAGNOSTIC RIGHT MAMMOGRAM WITH CAD ULTRASOUND RIGHT BREAST  COMPARISON:  08/11/2016, 07/21/2016, 07/14/2015, 06/20/2014, 05/31/2013, 06/08/2012 ACR Breast Density Category c: The breast tissue is heterogeneously dense, which may obscure small masses. FINDINGS: The parenchymal pattern of the right breast is stable. A partially circumscribed and partially obscured mass in the posterior third of the breast parenchyma near the junction with the retroglandular fat in the lower outer quadrant of the  right breast is mammographically stable. No new or suspicious mass, architectural distortion, or suspicious microcalcification is identified. Mammographic images were processed with CAD. On physical exam, I do not definitely palpate a lump in the lower outer quadrant of the right breast. Targeted ultrasound is performed, showing an oval hypoechoic mass oriented parallel to the chest wall overlying the pectoralis muscle at 7:30 position 3 cm from the nipple measuring 1.2 x 0.5 x 0.9 cm. There is no internal vascular flow. 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 No new or suspicious findings in the right breast. RECOMMENDATION: Bilateral diagnostic mammogram and right breast ultrasound is recommended in December 2018. I have discussed the findings and recommendations with the patient. Results were also provided in writing at the conclusion of the visit. If applicable, a reminder letter will be sent to the patient regarding the next appointment. BI-RADS CATEGORY  3: Probably benign. Electronically Signed   By: Curlene Dolphin M.D.   On: 02/10/2017 15:18   Mm Diag Breast Tomo Uni Right  Result Date: 02/10/2017 CLINICAL DATA:  Six month follow-up of probably benign right breast mass. EXAM: DIGITAL DIAGNOSTIC RIGHT MAMMOGRAM WITH CAD ULTRASOUND RIGHT BREAST COMPARISON:  08/11/2016, 07/21/2016, 07/14/2015, 06/20/2014, 05/31/2013, 06/08/2012 ACR  Breast Density Category c: The breast tissue is heterogeneously dense, which may obscure small masses. FINDINGS: The parenchymal pattern of the right breast is stable. A partially circumscribed and partially obscured mass in the posterior third of the breast parenchyma near the junction with the retroglandular fat in the lower outer quadrant of the right breast is mammographically stable. No new or suspicious mass, architectural distortion, or suspicious microcalcification is identified. Mammographic images were processed with CAD. On physical exam, I do not definitely palpate a lump in the lower outer quadrant of the right breast. Targeted ultrasound is performed, showing an oval hypoechoic mass oriented parallel to the chest wall overlying the pectoralis muscle at 7:30 position 3 cm from the nipple measuring 1.2 x 0.5 x 0.9 cm. There is no internal vascular flow. 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 No new or suspicious findings in the right breast. RECOMMENDATION: Bilateral diagnostic mammogram and right breast ultrasound is recommended in December 2018. I have discussed the findings and recommendations with the patient. Results were also provided in writing at the conclusion of the visit. If applicable, a reminder letter will be sent to the patient regarding the next appointment. BI-RADS CATEGORY  3: Probably benign. Electronically Signed   By: Curlene Dolphin M.D.   On: 02/10/2017 15:18       Assessment & Plan:   Problem List Items Addressed This Visit    Anxiety    Stable.  She feels she is doing well.  Follow.       Essential hypertension    Blood pressure under good control.  Continue same medication regimen.  Follow pressures.  Follow metabolic panel.        Sinusitis    Symptoms as outlined.  Persistent.  Treat with amoxicillin as directed.   Saline nasal spray, flonase nasal spray as directed.  Robitussin DM as directed.  Follow.        Relevant Medications   amoxicillin (AMOXIL) 875 MG tablet  Einar Pheasant, MD

## 2017-05-05 NOTE — Patient Instructions (Signed)
Continue your nasal sprays.  Take the probiotic as we discussed  Robitussin DM - twice a day as needed.

## 2017-05-07 ENCOUNTER — Encounter: Payer: Self-pay | Admitting: Internal Medicine

## 2017-05-07 DIAGNOSIS — J329 Chronic sinusitis, unspecified: Secondary | ICD-10-CM | POA: Insufficient documentation

## 2017-05-07 NOTE — Assessment & Plan Note (Signed)
Stable.  She feels she is doing well.  Follow.

## 2017-05-07 NOTE — Assessment & Plan Note (Addendum)
Symptoms as outlined.  Persistent.  Treat with amoxicillin as directed.  Saline nasal spray, flonase nasal spray as directed.  Robitussin DM as directed.  Follow.

## 2017-05-07 NOTE — Assessment & Plan Note (Signed)
Blood pressure under good control.  Continue same medication regimen.  Follow pressures.  Follow metabolic panel.   

## 2017-05-08 ENCOUNTER — Encounter: Payer: Self-pay | Admitting: Internal Medicine

## 2017-05-09 NOTE — Telephone Encounter (Signed)
Please call and confirm pt doing ok.  Confirm no breathing difficulty, lip swelling or tongue swelling.  Can hold pcn and call with update.  Any worsening symptoms or problems, needs evaluated.

## 2017-05-12 DIAGNOSIS — J301 Allergic rhinitis due to pollen: Secondary | ICD-10-CM | POA: Diagnosis not present

## 2017-05-12 DIAGNOSIS — J019 Acute sinusitis, unspecified: Secondary | ICD-10-CM | POA: Diagnosis not present

## 2017-05-31 ENCOUNTER — Encounter: Payer: Self-pay | Admitting: Internal Medicine

## 2017-05-31 ENCOUNTER — Ambulatory Visit (INDEPENDENT_AMBULATORY_CARE_PROVIDER_SITE_OTHER): Payer: PPO | Admitting: Internal Medicine

## 2017-05-31 VITALS — BP 128/78 | HR 72 | Temp 98.7°F | Resp 14 | Ht 62.0 in | Wt 147.8 lb

## 2017-05-31 DIAGNOSIS — Z9109 Other allergy status, other than to drugs and biological substances: Secondary | ICD-10-CM

## 2017-05-31 DIAGNOSIS — E039 Hypothyroidism, unspecified: Secondary | ICD-10-CM

## 2017-05-31 DIAGNOSIS — E785 Hyperlipidemia, unspecified: Secondary | ICD-10-CM

## 2017-05-31 DIAGNOSIS — F419 Anxiety disorder, unspecified: Secondary | ICD-10-CM | POA: Diagnosis not present

## 2017-05-31 DIAGNOSIS — I10 Essential (primary) hypertension: Secondary | ICD-10-CM | POA: Diagnosis not present

## 2017-05-31 DIAGNOSIS — G479 Sleep disorder, unspecified: Secondary | ICD-10-CM | POA: Diagnosis not present

## 2017-05-31 DIAGNOSIS — R928 Other abnormal and inconclusive findings on diagnostic imaging of breast: Secondary | ICD-10-CM | POA: Diagnosis not present

## 2017-05-31 MED ORDER — IRBESARTAN 75 MG PO TABS: ORAL_TABLET | ORAL | 1 refills | Status: DC

## 2017-05-31 NOTE — Progress Notes (Signed)
Patient ID: [name] [name], female   DOB: 02/05/1951, 66 y.o.   MRN: [id number]   Subjective:    Patient ID: [name] [name], female    DOB: 16-Sep-1950, 66 y.o.   MRN: [id number]  HPI  Patient here for a scheduled follow up.  She reports she is doing relatively well.  Has joined a support group.  Trying to cope with the recent death of her husband.  Overall she feels she is doing well.  Has good support.  Trying to stay active.  Plans to exercise more.  No chest pain.  No sob.  No acid reflux.  Taking Hardin Negus colon health.  Bowels doing well.  Sinus infection cleared.  Saw Dr Richardson Landry.  Placed on doxycycline and prednisone.  Has been off since 05/19/17.  Aching resolved.  A little allergy issues with stuffiness, but no infection or significant sinus symptoms.     Past Medical History:  Diagnosis Date  . Adenomatous polyp 2005  . Arrhythmia    H/O  . Basal cell carcinoma   . CTS (carpal tunnel syndrome)   . Environmental allergies   . History of chicken pox   . HTN (hypertension)   . Hypercholesterolemia   . Hypothyroidism    Past Surgical History:  Procedure Laterality Date  . BASAL CELL CARCINOMA EXCISION     right hip,abd, right arm, back neck  . BREAST CYST ASPIRATION Right    negative  . CTS releast Rt wrist    . lipoma removal     Rt shoulder   . MELANOMA EXCISION     right buttocks  . POLYPECTOMY     + TA  . TUBAL LIGATION     Family History  Problem Relation Age of Onset  . Cervical cancer Mother   . Arthritis Mother   . Hypertension Mother   . Sudden death Mother        cerbral hemorrage  . Heart disease Father   . Cancer Father        lung cancer  . Diabetes Maternal Grandmother   . Breast cancer Paternal Aunt 74  . Colon cancer Neg Hx    Social History   Social History  . Marital status: Married    Spouse name: N/A  . Number of children: 1  . Years of education: N/A   Occupational History  . [employer]  Dr, [employer]   Social  History Main Topics  . Smoking status: Former Smoker    Types: Cigarettes    Quit date: 07/16/1980  . Smokeless tobacco: Never Used  . Alcohol use 4.2 oz/week    7 Glasses of wine per week     Comment: 1 per day   . Drug use: No  . Sexual activity: No   Other Topics Concern  . None   Social History Narrative   Married; 1 son.    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 ammonium lactate (GERI-HYDROLAC 5) 5 % LOTN lotion Apply 1 application topically 2 (two) times daily.  Marland Kitchen azelastine (ASTELIN) 0.1 % nasal spray PLACE 1 SPRAY INTO BOTH NOSTRIL TWICE DAILY. USE AS DIRECTED.  Marland Kitchen BIOTIN PO Take 500 mg by mouth daily.  . cetirizine (ZYRTEC) 10 MG tablet Take 10 mg by mouth daily.  . cholecalciferol (VITAMIN D) 1000 units tablet Take 1,000 Units by  mouth daily.  . clobetasol (TEMOVATE) 0.05 % external solution Apply 1 application topically daily.  . Eszopiclone 3 MG TABS Take 1 tablet (3 mg total) by mouth at bedtime. Take immediately before bedtime  . fluticasone (FLONASE) 50 MCG/ACT nasal spray USE 2 SPRAYS IN EACH NOSTRIL EVERY DAY  . irbesartan (AVAPRO) 75 MG tablet TAKE 1/2 TO 1 TABLET DAILY AS DIRECTED  . Nutritional Supplements (NUTRITIONAL SUPPLEMENT PO) Take by mouth. DoTERRA: ALPHA CRS+, xEO MEGA, MICROPLEX MVp, ON GUARD (protective blend)  . Probiotic Product (PROBIOTIC-10 PO) Take by mouth.  . ranitidine (ZANTAC) 150 MG tablet Take by mouth 2 (two) times daily.   Marland Kitchen SYNTHROID 150 MCG tablet Take 1 tablet (150 mcg total) by mouth daily.  . [DISCONTINUED] irbesartan (AVAPRO) 75 MG tablet TAKE 1/2 TO 1 TABLET DAILY AS DIRECTED  . [DISCONTINUED] amoxicillin (AMOXIL) 875 MG tablet Take 1 tablet (875 mg total) by mouth 2 (two) times daily.   No facility-administered encounter medications on file as of 05/31/2017.     Review of Systems  Constitutional: Negative for appetite  change and unexpected weight change.  HENT: Negative for sinus pressure and sore throat.        Minimal allergy symptoms, but no sinus pressure.    Respiratory: Negative for cough, chest tightness and shortness of breath.   Cardiovascular: Negative for chest pain, palpitations and leg swelling.  Gastrointestinal: Negative for abdominal pain, diarrhea, nausea and vomiting.  Genitourinary: Negative for difficulty urinating and dysuria.  Musculoskeletal: Negative for joint swelling and myalgias.  Skin: Negative for color change and rash.  Neurological: Negative for dizziness, light-headedness and headaches.  Psychiatric/Behavioral: Negative for agitation and dysphoric mood.       Objective:    Physical Exam  Constitutional: She appears well-developed and well-nourished. No distress.  HENT:  Nose: Nose normal.  Mouth/Throat: Oropharynx is clear and moist.  Neck: Neck supple. No thyromegaly present.  Cardiovascular: Normal rate and regular rhythm.   Pulmonary/Chest: Breath sounds normal. No respiratory distress. She has no wheezes.  Abdominal: Soft. Bowel sounds are normal. There is no tenderness.  Musculoskeletal: She exhibits no edema or tenderness.  Lymphadenopathy:    She has no cervical adenopathy.  Skin: No rash noted. No erythema.  Psychiatric: She has a normal mood and affect. Her behavior is normal.    BP 128/78 (BP Location: Left Arm, Patient Position: Sitting, Cuff Size: Normal)   Pulse 72   Temp 98.7 F (37.1 C) (Oral)   Resp 14   Ht 5\' 2"  (1.575 m)   Wt 147 lb 12.8 oz (67 kg)   SpO2 98%   BMI 27.03 kg/m  Wt Readings from Last 3 Encounters:  05/31/17 147 lb 12.8 oz (67 kg)  05/05/17 149 lb (67.6 kg)  04/24/17 150 lb 2 oz (68.1 kg)     Lab Results  Component Value Date   WBC 6.0 03/01/2017   HGB 13.7 03/01/2017   HCT 40.5 03/01/2017   PLT 260.0 03/01/2017   GLUCOSE 101 (H) 03/01/2017   CHOL 192 03/01/2017   TRIG 73.0 03/01/2017   HDL 75.90 03/01/2017    LDLDIRECT 118.2 10/16/2006   LDLCALC 102 (H) 03/01/2017   ALT 16 03/01/2017   AST 16 03/01/2017   NA 139 03/01/2017   K 4.8 03/01/2017   CL 105 03/01/2017   CREATININE 0.75 03/01/2017   BUN 15 03/01/2017   CO2 30 03/01/2017   TSH 3.68 03/01/2017    US Breast Ltd Uni Right Inc  Axilla  Result Date: 02/10/2017 CLINICAL DATA:  Six month follow-up of probably benign right breast mass. EXAM: DIGITAL DIAGNOSTIC RIGHT MAMMOGRAM WITH CAD ULTRASOUND RIGHT BREAST COMPARISON:  08/11/2016, 07/21/2016, 07/14/2015, 06/20/2014, 05/31/2013, 06/08/2012 ACR Breast Density Category c: The breast tissue is heterogeneously dense, which may obscure small masses. FINDINGS: The parenchymal pattern of the right breast is stable. A partially circumscribed and partially obscured mass in the posterior third of the breast parenchyma near the junction with the retroglandular fat in the lower outer quadrant of the right breast is mammographically stable. No new or suspicious mass, architectural distortion, or suspicious microcalcification is identified. Mammographic images were processed with CAD. On physical exam, I do not definitely palpate a lump in the lower outer quadrant of the right breast. Targeted ultrasound is performed, showing an oval hypoechoic mass oriented parallel to the chest wall overlying the pectoralis muscle at 7:30 position 3 cm from the nipple measuring 1.2 x 0.5 x 0.9 cm. There is no internal vascular flow. 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 No new or suspicious findings in the right breast. RECOMMENDATION: Bilateral diagnostic mammogram and right breast ultrasound is recommended in December 2018. I have discussed the findings and recommendations with the patient. Results were also provided in writing at the conclusion of the visit. If applicable, a reminder  letter will be sent to the patient regarding the next appointment. BI-RADS CATEGORY  3: Probably benign. Electronically Signed   By: Curlene Dolphin M.D.   On: 02/10/2017 15:18   Mm Diag Breast Tomo Uni Right  Result Date: 02/10/2017 CLINICAL DATA:  Six month follow-up of probably benign right breast mass. EXAM: DIGITAL DIAGNOSTIC RIGHT MAMMOGRAM WITH CAD ULTRASOUND RIGHT BREAST COMPARISON:  08/11/2016, 07/21/2016, 07/14/2015, 06/20/2014, 05/31/2013, 06/08/2012 ACR Breast Density Category c: The breast tissue is heterogeneously dense, which may obscure small masses. FINDINGS: The parenchymal pattern of the right breast is stable. A partially circumscribed and partially obscured mass in the posterior third of the breast parenchyma near the junction with the retroglandular fat in the lower outer quadrant of the right breast is mammographically stable. No new or suspicious mass, architectural distortion, or suspicious microcalcification is identified. Mammographic images were processed with CAD. On physical exam, I do not definitely palpate a lump in the lower outer quadrant of the right breast. Targeted ultrasound is performed, showing an oval hypoechoic mass oriented parallel to the chest wall overlying the pectoralis muscle at 7:30 position 3 cm from the nipple measuring 1.2 x 0.5 x 0.9 cm. There is no internal vascular flow. 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 No new or suspicious findings in the right breast. RECOMMENDATION: Bilateral diagnostic mammogram and right breast ultrasound is recommended in December 2018. I have discussed the findings and recommendations with the patient. Results were also provided in writing at the conclusion of the visit. If applicable, a reminder letter will be sent to the patient regarding the next appointment. BI-RADS CATEGORY  3:  Probably benign. Electronically Signed   By: Curlene Dolphin M.D.   On: 02/10/2017 15:18       Assessment & Plan:   Problem List Items Addressed This Visit    Anxiety    Overall she feels she is doing relatively  well.  Has joined a support group.  Follow.        Environmental allergies    Has done well on current regimen.  Minimal allergy symptoms.  Overall doing well.        Essential hypertension    Blood pressure under good control.  Continue same medication regimen.  Follow pressures.  Follow metabolic panel.        Relevant Medications   irbesartan (AVAPRO) 75 MG tablet   Other Relevant Orders   Basic metabolic panel   Hyperlipidemia    Low cholesterol diet and exercise.  Follow lipid panel.        Relevant Medications   irbesartan (AVAPRO) 75 MG tablet   Other Relevant Orders   Hepatic function panel   Lipid panel   Hypothyroidism    On thyroid replacement.  Follow tsh.        Sleeping difficulty    Taking lunesta.  Stable.         Other Visit Diagnoses    Abnormal mammogram    -  Primary   F/u mammogram as outlined.  Scheduled for f/u bilateral mammogram with right breast ultrasound 07/2017.         Einar Pheasant, MD

## 2017-06-03 ENCOUNTER — Encounter: Payer: Self-pay | Admitting: Internal Medicine

## 2017-06-03 NOTE — Assessment & Plan Note (Signed)
Overall she feels she is doing relatively well.  Has joined a support group.  Follow.

## 2017-06-03 NOTE — Assessment & Plan Note (Signed)
Blood pressure under good control.  Continue same medication regimen.  Follow pressures.  Follow metabolic panel.   

## 2017-06-03 NOTE — Assessment & Plan Note (Signed)
Taking lunesta.  Stable.

## 2017-06-03 NOTE — Assessment & Plan Note (Signed)
Low cholesterol diet and exercise.  Follow lipid panel.   

## 2017-06-03 NOTE — Assessment & Plan Note (Signed)
On thyroid replacement.  Follow tsh.  

## 2017-06-03 NOTE — Assessment & Plan Note (Signed)
Has done well on current regimen.  Minimal allergy symptoms.  Overall doing well.

## 2017-06-20 ENCOUNTER — Encounter: Payer: Self-pay | Admitting: Internal Medicine

## 2017-06-20 DIAGNOSIS — L729 Follicular cyst of the skin and subcutaneous tissue, unspecified: Secondary | ICD-10-CM

## 2017-06-20 NOTE — Telephone Encounter (Signed)
Order placed for surgery referral.  

## 2017-06-21 ENCOUNTER — Encounter: Payer: Self-pay | Admitting: *Deleted

## 2017-06-26 ENCOUNTER — Encounter: Payer: Self-pay | Admitting: General Surgery

## 2017-06-26 ENCOUNTER — Ambulatory Visit (INDEPENDENT_AMBULATORY_CARE_PROVIDER_SITE_OTHER): Payer: PPO | Admitting: General Surgery

## 2017-06-26 VITALS — BP 128/74 | HR 83 | Resp 12 | Ht 62.0 in | Wt 147.0 lb

## 2017-06-26 DIAGNOSIS — L723 Sebaceous cyst: Secondary | ICD-10-CM | POA: Diagnosis not present

## 2017-06-26 NOTE — Progress Notes (Signed)
Patient ID: [name] [name], female   DOB: 1951-05-25, 66 y.o.   MRN: [id number]  Chief Complaint  Patient presents with  . Other    scalp cyst    HPI [name] [name] is a 66 y.o. female here for evaluation of a scalp cyst. She has several cysts. Her largest is on the right side of her scalp. She has had this for 3 years and it has enlarged. She reports no pain or discomfort.   HPI  Past Medical History:  Diagnosis Date  . Adenomatous polyp 2005  . Arrhythmia    H/O  . Basal cell carcinoma   . CTS (carpal tunnel syndrome)   . Environmental allergies   . History of chicken pox   . HTN (hypertension)   . Hypercholesterolemia   . Hypothyroidism     Past Surgical History:  Procedure Laterality Date  . BASAL CELL CARCINOMA EXCISION     right hip,abd, right arm, back neck  . BREAST CYST ASPIRATION Right    negative  . CTS releast Rt wrist    . lipoma removal     Rt shoulder   . MELANOMA EXCISION     right buttocks, top of scalp  . POLYPECTOMY     + TA  . TUBAL LIGATION  1982    Family History  Problem Relation Age of Onset  . Cervical cancer Mother   . Arthritis Mother   . Hypertension Mother   . Sudden death Mother        cerbral hemorrage  . Heart disease Father   . Cancer Father        lung cancer  . Diabetes Maternal Grandmother   . Breast cancer Paternal Aunt 74  . Colon cancer Neg Hx     Social History Social History   Tobacco Use  . Smoking status: Former Smoker    Types: Cigarettes    Last attempt to quit: 07/16/1980    Years since quitting: 36.9  . Smokeless tobacco: Never Used  Substance Use Topics  . Alcohol use: Yes    Alcohol/week: 4.2 oz    Types: 7 Glasses of wine per week    Comment: 1 per day   . Drug use: No    Allergies  Allergen Reactions  . Codeine     NAUSEA  . Penicillins Hives    Current Outpatient Medications  Medication Sig Dispense Refill  . ammonium lactate (GERI-HYDROLAC 5) 5 % LOTN lotion Apply 1 application  topically 2 (two) times daily.    Marland Kitchen azelastine (ASTELIN) 0.1 % nasal spray PLACE 1 SPRAY INTO BOTH NOSTRIL TWICE DAILY. USE AS DIRECTED. 30 mL 2  . BIOTIN PO Take 500 mg by mouth daily.    . cetirizine (ZYRTEC) 10 MG tablet Take 10 mg by mouth daily.    . cholecalciferol (VITAMIN D) 1000 units tablet Take 1,000 Units by mouth daily.    . clobetasol (TEMOVATE) 0.05 % external solution Apply 1 application topically daily.    . Eszopiclone 3 MG TABS Take 1 tablet (3 mg total) by mouth at bedtime. Take immediately before bedtime 30 tablet 1  . fluticasone (FLONASE) 50 MCG/ACT nasal spray USE 2 SPRAYS IN EACH NOSTRIL EVERY DAY 16 g 1  . irbesartan (AVAPRO) 75 MG tablet TAKE 1/2 TO 1 TABLET DAILY AS DIRECTED 90 tablet 1  . Nutritional Supplements (NUTRITIONAL SUPPLEMENT PO) Take by mouth. DoTERRA: ALPHA CRS+, xEO MEGA, MICROPLEX MVp, ON GUARD (protective blend)    .  Probiotic Product (PROBIOTIC-10 PO) Take by mouth.    . ranitidine (ZANTAC) 150 MG tablet Take by mouth 2 (two) times daily.     Marland Kitchen SYNTHROID 150 MCG tablet Take 1 tablet (150 mcg total) by mouth daily. 90 tablet 2   No current facility-administered medications for this visit.     Review of Systems Review of Systems  Constitutional: Negative.   Respiratory: Negative.   Cardiovascular: Negative.     Blood pressure 128/74, pulse 83, resp. rate 12, height 5\' 2"  (1.575 m), weight 147 lb (66.7 kg).  Physical Exam Physical Exam  Constitutional: She is oriented to person, place, and time. She appears well-developed and well-nourished.  HENT:  Head:    Right parietal scalp 1 cm sebaceous cyst 1 cm sebaceous cyst on the left parietal scalp Left temporal area there is a 9 mm sebaceous cyst  Eyes: Conjunctivae are normal. No scleral icterus.  Neck: Neck supple.  Cardiovascular: Normal rate, regular rhythm and normal heart sounds.  Pulmonary/Chest: Effort normal and breath sounds normal.  Lymphadenopathy:    She has no cervical  adenopathy.  Neurological: She is alert and oriented to person, place, and time.  Skin: Skin is warm and dry.     Psychiatric: She has a normal mood and affect.      Assessment    Multiple noninfected sebaceous cysts of the scalp.    Plan    Secondary to insurance restrictions, it was notpossible to resect these today.  Excision of these lesions will be scheduled as a office procedure at a convenient date for the patient.    HPI, Physical Exam, Assessment and Plan have been scribed under the direction and in the presence of Robert Bellow, MD  [name] Living, LPN  I have completed the exam and reviewed the above documentation for accuracy and completeness.  I agree with the above.  Haematologist has been used and any errors in dictation or transcription are unintentional.  Hervey Ard, M.D., F.A.C.S.  Robert Bellow 06/26/2017, 6:55 PM

## 2017-07-12 ENCOUNTER — Other Ambulatory Visit: Payer: Self-pay | Admitting: Internal Medicine

## 2017-07-14 ENCOUNTER — Telehealth: Payer: Self-pay | Admitting: Internal Medicine

## 2017-07-14 ENCOUNTER — Other Ambulatory Visit: Payer: Self-pay | Admitting: Internal Medicine

## 2017-07-14 NOTE — Telephone Encounter (Signed)
Pharmacy calling back, still have not received this medication please advise.

## 2017-07-14 NOTE — Telephone Encounter (Signed)
Spoke with [facility], they have now received rx

## 2017-07-14 NOTE — Telephone Encounter (Signed)
ok'd rx for lunesta #30 with one refill.  rx signed and placed in box.

## 2017-07-14 NOTE — Telephone Encounter (Signed)
Last OV 05/31/17 last refill 02/17/17 for 30 and 1 refill.

## 2017-07-14 NOTE — Telephone Encounter (Signed)
Copied from [place]. Topic: Quick Communication - See Telephone Encounter >> Jul 14, 2017 12:43 PM Bea Graff, NT wrote: CRM for notification. See Telephone encounter for: [facility] called and states they did not receive the rx for eszopiclone and would like to see if this can be called in again.  07/14/17.

## 2017-07-18 ENCOUNTER — Encounter: Payer: Self-pay | Admitting: General Surgery

## 2017-07-18 ENCOUNTER — Ambulatory Visit (INDEPENDENT_AMBULATORY_CARE_PROVIDER_SITE_OTHER): Payer: PPO | Admitting: General Surgery

## 2017-07-18 VITALS — BP 126/64 | HR 66 | Resp 12 | Ht 62.0 in | Wt 149.0 lb

## 2017-07-18 DIAGNOSIS — L729 Follicular cyst of the skin and subcutaneous tissue, unspecified: Secondary | ICD-10-CM | POA: Diagnosis not present

## 2017-07-18 DIAGNOSIS — L7211 Pilar cyst: Secondary | ICD-10-CM | POA: Diagnosis not present

## 2017-07-18 NOTE — Progress Notes (Signed)
zPatient ID: [name] [name], female   DOB: 08-Apr-1951, 66 y.o.   MRN: [id number]  Chief Complaint  Patient presents with  . Procedure    HPI [name] [name] is a 66 y.o. female.  Here today for scalp lesion excisions.  HPI  Past Medical History:  Diagnosis Date  . Adenomatous polyp 2005  . Arrhythmia    H/O  . Basal cell carcinoma   . CTS (carpal tunnel syndrome)   . Environmental allergies   . History of chicken pox   . HTN (hypertension)   . Hypercholesterolemia   . Hypothyroidism     Past Surgical History:  Procedure Laterality Date  . BASAL CELL CARCINOMA EXCISION     right hip,abd, right arm, back neck  . BREAST CYST ASPIRATION Right    negative  . CTS releast Rt wrist    . lipoma removal     Rt shoulder   . MELANOMA EXCISION     right buttocks, top of scalp  . POLYPECTOMY     + TA  . TUBAL LIGATION  1982    Family History  Problem Relation Age of Onset  . Cervical cancer Mother   . Arthritis Mother   . Hypertension Mother   . Sudden death Mother        cerbral hemorrage  . Heart disease Father   . Cancer Father        lung cancer  . Diabetes Maternal Grandmother   . Breast cancer Paternal Aunt 74  . Colon cancer Neg Hx     Social History Social History   Tobacco Use  . Smoking status: Former Smoker    Types: Cigarettes    Last attempt to quit: 07/16/1980    Years since quitting: 37.0  . Smokeless tobacco: Never Used  Substance Use Topics  . Alcohol use: Yes    Alcohol/week: 4.2 oz    Types: 7 Glasses of wine per week    Comment: 1 per day   . Drug use: No    Allergies  Allergen Reactions  . Codeine     NAUSEA  . Penicillins Hives    Current Outpatient Medications  Medication Sig Dispense Refill  . ammonium lactate (GERI-HYDROLAC 5) 5 % LOTN lotion Apply 1 application topically 2 (two) times daily.    Marland Kitchen azelastine (ASTELIN) 0.1 % nasal spray PLACE 1 SPRAY INTO BOTH NOSTRIL TWICE DAILY. USE AS DIRECTED. 30 mL 2  . BIOTIN PO Take  500 mg by mouth daily.    . cetirizine (ZYRTEC) 10 MG tablet Take 10 mg by mouth daily.    . cholecalciferol (VITAMIN D) 1000 units tablet Take 1,000 Units by mouth daily.    . clobetasol (TEMOVATE) 0.05 % external solution Apply 1 application topically daily.    . Eszopiclone 3 MG TABS TAKE 1 TABLET BY MOUTH AT BEDTIME TAKE IMMEDIATELY BEFORE BEDTIME 30 tablet 1  . fluticasone (FLONASE) 50 MCG/ACT nasal spray USE 2 SPRAYS IN EACH NOSTRIL EVERY DAY 16 g 1  . irbesartan (AVAPRO) 75 MG tablet TAKE 1/2 TO 1 TABLET DAILY AS DIRECTED 90 tablet 1  . Nutritional Supplements (NUTRITIONAL SUPPLEMENT PO) Take by mouth. DoTERRA: ALPHA CRS+, xEO MEGA, MICROPLEX MVp, ON GUARD (protective blend)    . Probiotic Product (PROBIOTIC-10 PO) Take by mouth.    . ranitidine (ZANTAC) 150 MG tablet Take by mouth 2 (two) times daily.     Marland Kitchen SYNTHROID 150 MCG tablet Take 1 tablet (150 mcg total) by  mouth daily. 90 tablet 2   No current facility-administered medications for this visit.     Review of Systems Review of Systems  Constitutional: Negative.   Respiratory: Negative.   Cardiovascular: Negative.     Blood pressure 126/64, pulse 66, resp. rate 12, height 5\' 2"  (1.575 m), weight 149 lb (67.6 kg), SpO2 98 %.  Physical Exam Physical Exam  Constitutional: She is oriented to person, place, and time. She appears well-developed and well-nourished.  Neurological: She is alert and oriented to person, place, and time.  Skin: Skin is warm and dry.  Psychiatric: Her behavior is normal.    Data Reviewed The areas on the scalp were identified and cleared with clippers. 10 mL of 0.5% Xylocaine with 0.25% Marcaine with 1-200,000 epinephrine were utilized. The areas were excised through a 1 cm linear incision. Areas consistent with a noninfected sebaceous cyst were removed. Skin defect was closed with a running 4-0 Prolene suture. Bacitracin applied.  Assessment    Sebaceous cysts of the scalp.    Plan    Wound  care reviewed.     Return for suture removal in one week  HPI, Physical Exam, Assessment and Plan have been scribed under the direction and in the presence of Robert Bellow, MD. [name], RN  I have completed the exam and reviewed the above documentation for accuracy and completeness.  I agree with the above.  Haematologist has been used and any errors in dictation or transcription are unintentional.  Hervey Ard, M.D., F.A.C.S.   Robert Bellow 07/18/2017, 8:48 PM

## 2017-07-18 NOTE — Patient Instructions (Addendum)
The patient is aware to call back for any questions or concerns.  Return for suture removal

## 2017-07-25 ENCOUNTER — Ambulatory Visit (INDEPENDENT_AMBULATORY_CARE_PROVIDER_SITE_OTHER): Payer: PPO | Admitting: *Deleted

## 2017-07-25 DIAGNOSIS — L729 Follicular cyst of the skin and subcutaneous tissue, unspecified: Secondary | ICD-10-CM

## 2017-07-25 NOTE — Patient Instructions (Signed)
Return as needed

## 2017-07-25 NOTE — Progress Notes (Signed)
The sutures were removed. No sign of infection.  

## 2017-08-14 ENCOUNTER — Ambulatory Visit
Admission: RE | Admit: 2017-08-14 | Discharge: 2017-08-14 | Disposition: A | Discharge status: Home / Self Care | Payer: PPO | Source: Ambulatory Visit | Attending: Internal Medicine | Admitting: Internal Medicine

## 2017-08-14 DIAGNOSIS — R928 Other abnormal and inconclusive findings on diagnostic imaging of breast: Secondary | ICD-10-CM

## 2017-08-14 DIAGNOSIS — R922 Inconclusive mammogram: Secondary | ICD-10-CM | POA: Diagnosis not present

## 2017-08-14 DIAGNOSIS — N6489 Other specified disorders of breast: Secondary | ICD-10-CM | POA: Diagnosis not present

## 2017-08-23 ENCOUNTER — Encounter: Payer: Self-pay | Admitting: General Surgery

## 2017-08-28 DIAGNOSIS — Z85828 Personal history of other malignant neoplasm of skin: Secondary | ICD-10-CM | POA: Diagnosis not present

## 2017-08-28 DIAGNOSIS — B078 Other viral warts: Secondary | ICD-10-CM | POA: Diagnosis not present

## 2017-08-28 DIAGNOSIS — L570 Actinic keratosis: Secondary | ICD-10-CM | POA: Diagnosis not present

## 2017-08-28 DIAGNOSIS — L578 Other skin changes due to chronic exposure to nonionizing radiation: Secondary | ICD-10-CM | POA: Diagnosis not present

## 2017-08-28 DIAGNOSIS — Z85820 Personal history of malignant melanoma of skin: Secondary | ICD-10-CM | POA: Diagnosis not present

## 2017-08-28 DIAGNOSIS — L817 Pigmented purpuric dermatosis: Secondary | ICD-10-CM | POA: Diagnosis not present

## 2017-08-28 DIAGNOSIS — L638 Other alopecia areata: Secondary | ICD-10-CM | POA: Diagnosis not present

## 2017-08-28 DIAGNOSIS — L918 Other hypertrophic disorders of the skin: Secondary | ICD-10-CM | POA: Diagnosis not present

## 2017-08-28 DIAGNOSIS — Z859 Personal history of malignant neoplasm, unspecified: Secondary | ICD-10-CM | POA: Diagnosis not present

## 2017-08-28 DIAGNOSIS — L720 Epidermal cyst: Secondary | ICD-10-CM | POA: Diagnosis not present

## 2017-08-30 ENCOUNTER — Other Ambulatory Visit (INDEPENDENT_AMBULATORY_CARE_PROVIDER_SITE_OTHER): Payer: PPO

## 2017-08-30 ENCOUNTER — Encounter: Payer: Self-pay | Admitting: Internal Medicine

## 2017-08-30 DIAGNOSIS — E785 Hyperlipidemia, unspecified: Secondary | ICD-10-CM

## 2017-08-30 DIAGNOSIS — I10 Essential (primary) hypertension: Secondary | ICD-10-CM

## 2017-08-30 LAB — BASIC METABOLIC PANEL
BUN: 16 mg/dL (ref 6–23)
CO2: 32 mEq/L (ref 19–32)
Calcium: 9.4 mg/dL (ref 8.4–10.5)
Chloride: 104 mEq/L (ref 96–112)
Creatinine, Ser: 0.74 mg/dL (ref 0.40–1.20)
GFR: 82.9 mL/min (ref >60.00)
GLUCOSE: 95 mg/dL (ref 70–99)
POTASSIUM: 4.6 meq/L (ref 3.5–5.1)
SODIUM: 140 meq/L (ref 135–145)

## 2017-08-30 LAB — LIPID PANEL
Cholesterol: 214 mg/dL — ABNORMAL HIGH (ref 0–200)
HDL: 76.2 mg/dL (ref >39.00)
LDL CALC: 123 mg/dL — AB (ref 0–99)
NONHDL: 137.43
Total CHOL/HDL Ratio: 3
Triglycerides: 71 mg/dL (ref 0.0–149.0)
VLDL: 14.2 mg/dL (ref 0.0–40.0)

## 2017-08-30 LAB — HEPATIC FUNCTION PANEL
ALBUMIN: 4.3 g/dL (ref 3.5–5.2)
ALT: 20 U/L (ref 0–35)
AST: 19 U/L (ref 0–37)
Alkaline Phosphatase: 61 U/L (ref 39–117)
BILIRUBIN TOTAL: 0.4 mg/dL (ref 0.2–1.2)
Bilirubin, Direct: 0.1 mg/dL (ref 0.0–0.3)
Total Protein: 6.4 g/dL (ref 6.0–8.3)

## 2017-08-31 ENCOUNTER — Encounter: Payer: Self-pay | Admitting: Internal Medicine

## 2017-08-31 ENCOUNTER — Ambulatory Visit (INDEPENDENT_AMBULATORY_CARE_PROVIDER_SITE_OTHER): Payer: PPO | Admitting: Internal Medicine

## 2017-08-31 DIAGNOSIS — Z0000 Encounter for general adult medical examination without abnormal findings: Secondary | ICD-10-CM | POA: Diagnosis not present

## 2017-08-31 DIAGNOSIS — E785 Hyperlipidemia, unspecified: Secondary | ICD-10-CM | POA: Diagnosis not present

## 2017-08-31 DIAGNOSIS — F419 Anxiety disorder, unspecified: Secondary | ICD-10-CM

## 2017-08-31 DIAGNOSIS — E039 Hypothyroidism, unspecified: Secondary | ICD-10-CM | POA: Diagnosis not present

## 2017-08-31 DIAGNOSIS — I10 Essential (primary) hypertension: Secondary | ICD-10-CM | POA: Diagnosis not present

## 2017-08-31 MED ORDER — ZOSTER VAC RECOMB ADJUVANTED 50 MCG/0.5ML IM SUSR: 0.5000 mL | Freq: Once | INTRAMUSCULAR | 0 refills | Status: AC

## 2017-08-31 MED ORDER — ROSUVASTATIN CALCIUM 5 MG PO TABS: 5.0000 mg | ORAL_TABLET | Freq: Every day | ORAL | 2 refills | Status: DC

## 2017-08-31 NOTE — Progress Notes (Signed)
Patient ID: [name] [name], female   DOB: 1951-01-16, 67 y.o.   MRN: [id number]   Subjective:    Patient ID: [name] [name], female    DOB: [birth year], 67 y.o.   MRN: [id number]  HPI  Patient with past history of hypercholesterolemia, hypothyroidism and hypertension.  She comes in today to follow up on these issues as well as for a complete physical exam.  She is doing relatively well.  Still attending the support group - helping is coping with her husband's recent death.  Staying active.  No chest pain.  No sob.  No acid reflux.  No abdominal pain.  Bowels moving.  Discussed recent labs.  Discussed cholesterol screening risk.  Discussed starting crestor.  She was in agreement.  She is exercising.  Going to exercise class.     Past Medical History:  Diagnosis Date  . Adenomatous polyp 2005  . Arrhythmia    H/O  . Basal cell carcinoma   . CTS (carpal tunnel syndrome)   . Environmental allergies   . History of chicken pox   . HTN (hypertension)   . Hypercholesterolemia   . Hypothyroidism    Past Surgical History:  Procedure Laterality Date  . BASAL CELL CARCINOMA EXCISION     right hip,abd, right arm, back neck  . BREAST CYST ASPIRATION Right    negative  . CTS releast Rt wrist    . lipoma removal     Rt shoulder   . MELANOMA EXCISION     right buttocks, top of scalp  . POLYPECTOMY     + TA  . TUBAL LIGATION  1982   Family History  Problem Relation Age of Onset  . Cervical cancer Mother   . Arthritis Mother   . Hypertension Mother   . Sudden death Mother        cerbral hemorrage  . Heart disease Father   . Cancer Father        lung cancer  . Diabetes Maternal Grandmother   . Breast cancer Paternal Aunt 74  . Colon cancer Neg Hx    Social History   Socioeconomic History  . Marital status: Married    Spouse name: None  . Number of children: 1  . Years of education: None  . Highest education level: None  Social Needs  . Financial resource strain: None  .  Food insecurity - worry: None  . Food insecurity - inability: None  . Transportation needs - medical: None  . Transportation needs - non-medical: None  Occupational History  . 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 Smoking status: Former Smoker    Types: Cigarettes    Last attempt to quit: 07/16/1980    Years since quitting: 37.1  . Smokeless tobacco: Never Used  Substance and Sexual Activity  . Alcohol use: Yes    Alcohol/week: 4.2 oz    Types: 7 Glasses of wine per week    Comment: 1 per day   . Drug use: No  . Sexual activity: No  Other Topics Concern  . None  Social History Narrative   Married; 1 son.    Dental assistant   Daily caffeine use 2-3/day       Cell # [id number]    Outpatient Encounter Medications as of 08/31/2017  Medication Sig  . ammonium lactate (GERI-HYDROLAC 5) 5 % LOTN lotion Apply 1 application topically 2 (two) times daily.  Marland Kitchen azelastine (ASTELIN)  0.1 % nasal spray PLACE 1 SPRAY INTO BOTH NOSTRIL TWICE DAILY. USE AS DIRECTED.  Marland Kitchen BIOTIN PO Take 500 mg by mouth daily.  . cetirizine (ZYRTEC) 10 MG tablet Take 10 mg by mouth daily.  . cholecalciferol (VITAMIN D) 1000 units tablet Take 1,000 Units by mouth daily.  . clobetasol (TEMOVATE) 0.05 % external solution Apply 1 application topically daily.  . Eszopiclone 3 MG TABS TAKE 1 TABLET BY MOUTH AT BEDTIME TAKE IMMEDIATELY BEFORE BEDTIME  . fluticasone (FLONASE) 50 MCG/ACT nasal spray USE 2 SPRAYS IN EACH NOSTRIL EVERY DAY  . irbesartan (AVAPRO) 75 MG tablet TAKE 1/2 TO 1 TABLET DAILY AS DIRECTED  . Nutritional Supplements (NUTRITIONAL SUPPLEMENT PO) Take by mouth. DoTERRA: ALPHA CRS+, xEO MEGA, MICROPLEX MVp, ON GUARD (protective blend)  . Probiotic Product (PROBIOTIC-10 PO) Take by mouth.  . ranitidine (ZANTAC) 150 MG tablet Take by mouth 2 (two) times daily.   . rosuvastatin (CRESTOR) 5 MG tablet Take 1 tablet (5 mg total) by mouth daily.  Marland Kitchen SYNTHROID 150 MCG  tablet Take 1 tablet (150 mcg total) by mouth daily.  . [EXPIRED] Zoster Vaccine Adjuvanted [facility]) injection Inject 0.5 mLs into the muscle once for 1 dose.   No facility-administered encounter medications on file as of 08/31/2017.     Review of Systems  Constitutional: Negative for appetite change and unexpected weight change.  HENT: Negative for congestion and sinus pressure.   Eyes: Negative for pain and visual disturbance.  Respiratory: Negative for cough, chest tightness and shortness of breath.   Cardiovascular: Negative for chest pain, palpitations and leg swelling.  Gastrointestinal: Negative for abdominal pain, diarrhea, nausea and vomiting.  Genitourinary: Negative for difficulty urinating and dysuria.  Musculoskeletal: Negative for joint swelling and myalgias.  Skin: Negative for color change and rash.  Neurological: Negative for dizziness, light-headedness and headaches.  Hematological: Negative for adenopathy. Does not bruise/bleed easily.  Psychiatric/Behavioral: Negative for agitation and dysphoric mood.       Increased stress as outlined.        Objective:     Blood pressure rechecked by me:  128-130/78  Physical Exam  Constitutional: She is oriented to person, place, and time. She appears well-developed and well-nourished. No distress.  HENT:  Nose: Nose normal.  Mouth/Throat: Oropharynx is clear and moist.  Eyes: Right eye exhibits no discharge. Left eye exhibits no discharge. No scleral icterus.  Neck: Neck supple. No thyromegaly present.  Cardiovascular: Normal rate and regular rhythm.  Pulmonary/Chest: Breath sounds normal. No accessory muscle usage. No tachypnea. No respiratory distress. She has no decreased breath sounds. She has no wheezes. She has no rhonchi. Right breast exhibits no inverted nipple, no mass, no nipple discharge and no tenderness (no axillary adenopathy). Left breast exhibits no inverted nipple, no mass, no nipple discharge and no  tenderness (no axilarry adenopathy).  Abdominal: Soft. Bowel sounds are normal. There is no tenderness.  Musculoskeletal: She exhibits no edema or tenderness.  Lymphadenopathy:    She has no cervical adenopathy.  Neurological: She is alert and oriented to person, place, and time.  Skin: Skin is warm. No rash noted. No erythema.  Psychiatric: She has a normal mood and affect. Her behavior is normal.    BP 122/62 (BP Location: Left Arm, Patient Position: Sitting, Cuff Size: Normal)   Pulse 63   Temp 98.2 F (36.8 C) (Oral)   Resp 16   Ht 5\' 2"  (1.575 m)   Wt 150 lb 3.2 oz (68.1 kg)  SpO2 99%   BMI 27.47 kg/m  Wt Readings from Last 3 Encounters:  08/31/17 150 lb 3.2 oz (68.1 kg)  07/18/17 149 lb (67.6 kg)  06/26/17 147 lb (66.7 kg)     Lab Results  Component Value Date   WBC 6.0 03/01/2017   HGB 13.7 03/01/2017   HCT 40.5 03/01/2017   PLT 260.0 03/01/2017   GLUCOSE 95 08/30/2017   CHOL 214 (H) 08/30/2017   TRIG 71.0 08/30/2017   HDL 76.20 08/30/2017   LDLDIRECT 118.2 10/16/2006   LDLCALC 123 (H) 08/30/2017   ALT 20 08/30/2017   AST 19 08/30/2017   NA 140 08/30/2017   K 4.6 08/30/2017   CL 104 08/30/2017   CREATININE 0.74 08/30/2017   BUN 16 08/30/2017   CO2 32 08/30/2017   TSH 3.68 03/01/2017    US Breast Ltd Uni Right Inc Axilla  Result Date: 08/14/2017 CLINICAL DATA:  67 year old female presenting for annual bilateral mammogram and 1 year follow-up of a 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 EXAM: 2D DIGITAL DIAGNOSTIC BILATERAL MAMMOGRAM WITH CAD AND ADJUNCT TOMO ULTRASOUND RIGHT BREAST COMPARISON:  Previous exam(s). ACR Breast Density Category c: The breast tissue is heterogeneously dense, which may obscure small masses. FINDINGS: A stable circumscribed masses identified in the central right breast. No other suspicious masses or calcifications are identified in either breast. The parenchymal pattern is stable. Mammographic images were processed with CAD. Targeted  ultrasound is performed, showing a stable, circumscribed hypoechoic mass at the 7:30 position 3 cm from the nipple right. It measures 1.3 x 1.0 x 0.5 cm. This is unchanged given slight variances in positioning and technique. IMPRESSION: 1. 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 Recommendation is for final scratched sonographic follow-up in 1 year to coincide with the patient's annual bilateral mammogram. 2. No mammographic evidence of malignancy on the left. RECOMMENDATION: Diagnostic bilateral mammogram and right breast ultrasound in 1 year. I have discussed the findings and recommendations with the patient. Results were also provided in writing at the conclusion of the visit. If applicable, a reminder letter will be sent to the patient regarding the next appointment. BI-RADS CATEGORY  3: Probably benign. Electronically Signed   By: Kristopher Oppenheim M.D.   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 EXAM: 2D DIGITAL DIAGNOSTIC BILATERAL MAMMOGRAM WITH CAD AND ADJUNCT TOMO ULTRASOUND RIGHT BREAST COMPARISON:  Previous exam(s). ACR Breast Density Category c: The breast tissue is heterogeneously dense, which may obscure small masses. FINDINGS: A stable circumscribed masses identified in the central right breast. No other suspicious masses or calcifications are identified in either breast. The parenchymal pattern is stable. Mammographic images were processed with CAD. Targeted ultrasound is performed, showing a stable, circumscribed hypoechoic mass at the 7:30 position 3 cm from the nipple right. It measures 1.3 x 1.0 x 0.5 cm. This is unchanged given slight variances in positioning and technique. IMPRESSION: 1. 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 Recommendation is  for final scratched sonographic follow-up in 1 year to coincide with the patient's annual bilateral mammogram. 2. No mammographic evidence of malignancy on the left. RECOMMENDATION: Diagnostic bilateral mammogram and right breast ultrasound in 1 year. I have discussed the findings and recommendations with the patient. Results were also provided in writing at the conclusion of the visit. If applicable, a  reminder letter will be sent to the patient regarding the next appointment. BI-RADS CATEGORY  3: Probably benign. Electronically Signed   By: Kristopher Oppenheim M.D.   On: 08/14/2017 11:46       Assessment & Plan:   Problem List Items Addressed This Visit    Anxiety    Overall stable.  Doing well.  Continues with her support group.  Follow.       Essential hypertension    Blood pressure under good control.  Continue same medication regimen.  Follow pressures.  Follow metabolic panel.        Relevant Medications   rosuvastatin (CRESTOR) 5 MG tablet   Health care maintenance    Physical today 08/31/17.  Colonoscopy 07/2013.  Mammogram 08/14/17 - briads III.  Recommended f/u mammogram and ultrasound in one year.        Hyperlipidemia    Low cholesterol diet and exercise.  Follow lipid panel.  Discussed calculated cholesterol risk.  Will start crestor 5mg  q day.  Follow lipid panel and liver function tests.        Relevant Medications   rosuvastatin (CRESTOR) 5 MG tablet   Other Relevant Orders   Hepatic function panel   Hypothyroidism    On thyroid replacement.  Follow tsh.           Einar Pheasant, MD

## 2017-09-03 ENCOUNTER — Encounter: Payer: Self-pay | Admitting: Internal Medicine

## 2017-09-03 NOTE — Assessment & Plan Note (Signed)
Overall stable.  Doing well.  Continues with her support group.  Follow.

## 2017-09-03 NOTE — Assessment & Plan Note (Signed)
Blood pressure under good control.  Continue same medication regimen.  Follow pressures.  Follow metabolic panel.   

## 2017-09-03 NOTE — Assessment & Plan Note (Signed)
Physical today 08/31/17.  Colonoscopy 07/2013.  Mammogram 08/14/17 - briads III.  Recommended f/u mammogram and ultrasound in one year.

## 2017-09-03 NOTE — Assessment & Plan Note (Signed)
Low cholesterol diet and exercise.  Follow lipid panel.  Discussed calculated cholesterol risk.  Will start crestor 5mg  q day.  Follow lipid panel and liver function tests.

## 2017-09-03 NOTE — Assessment & Plan Note (Signed)
On thyroid replacement.  Follow tsh.  

## 2017-10-12 ENCOUNTER — Other Ambulatory Visit (INDEPENDENT_AMBULATORY_CARE_PROVIDER_SITE_OTHER): Payer: PPO

## 2017-10-12 DIAGNOSIS — E785 Hyperlipidemia, unspecified: Secondary | ICD-10-CM

## 2017-10-12 LAB — HEPATIC FUNCTION PANEL
ALBUMIN: 4.2 g/dL (ref 3.5–5.2)
ALT: 30 U/L (ref 0–35)
AST: 21 U/L (ref 0–37)
Alkaline Phosphatase: 63 U/L (ref 39–117)
Bilirubin, Direct: 0.1 mg/dL (ref 0.0–0.3)
TOTAL PROTEIN: 6.7 g/dL (ref 6.0–8.3)
Total Bilirubin: 0.4 mg/dL (ref 0.2–1.2)

## 2017-10-13 ENCOUNTER — Encounter: Payer: Self-pay | Admitting: Internal Medicine

## 2017-10-26 ENCOUNTER — Encounter: Payer: Self-pay | Admitting: Internal Medicine

## 2017-10-26 MED ORDER — ESZOPICLONE 3 MG PO TABS: ORAL_TABLET | ORAL | 1 refills | Status: DC

## 2017-10-26 NOTE — Telephone Encounter (Signed)
Refill printed and placed in quick sign.

## 2017-10-31 ENCOUNTER — Encounter: Payer: Self-pay | Admitting: Internal Medicine

## 2017-10-31 ENCOUNTER — Other Ambulatory Visit: Payer: Self-pay | Admitting: Internal Medicine

## 2017-11-17 DIAGNOSIS — J019 Acute sinusitis, unspecified: Secondary | ICD-10-CM | POA: Diagnosis not present

## 2017-11-17 DIAGNOSIS — J301 Allergic rhinitis due to pollen: Secondary | ICD-10-CM | POA: Diagnosis not present

## 2017-11-30 ENCOUNTER — Telehealth: Payer: Self-pay | Admitting: Internal Medicine

## 2017-11-30 NOTE — Telephone Encounter (Signed)
Copied from [place] [id number]. Topic: Quick Communication - Rx Refill/Question >> Nov 30, 2017 12:03 PM Lennox Solders wrote: Medication: generic crestor 5 mg #90 Has the patient contacted their pharmacy?  Preferred Pharmacy (with phone number or street name):  [facility] [phone] Agent: Please be advised that RX refills may take up to 3 business days. We ask that you follow-up with your pharmacy.

## 2017-11-30 NOTE — Telephone Encounter (Signed)
Contacted pt regarding refill for crestor; ordering to chart prescription dated 10/31/17 30 tablets with 2 refills; she verbalizes understanding and will call her pharmacy to order refill.

## 2017-12-06 ENCOUNTER — Encounter: Payer: Self-pay | Admitting: Internal Medicine

## 2017-12-06 ENCOUNTER — Other Ambulatory Visit: Payer: Self-pay

## 2017-12-06 MED ORDER — AZELASTINE HCL 0.1 % NA SOLN: NASAL | 2 refills | Status: DC

## 2017-12-18 ENCOUNTER — Other Ambulatory Visit: Payer: Self-pay | Admitting: Internal Medicine

## 2017-12-22 ENCOUNTER — Other Ambulatory Visit: Payer: Self-pay

## 2017-12-28 ENCOUNTER — Telehealth: Payer: Self-pay | Admitting: Radiology

## 2017-12-28 DIAGNOSIS — I10 Essential (primary) hypertension: Secondary | ICD-10-CM

## 2017-12-28 DIAGNOSIS — E785 Hyperlipidemia, unspecified: Secondary | ICD-10-CM

## 2017-12-28 DIAGNOSIS — E039 Hypothyroidism, unspecified: Secondary | ICD-10-CM

## 2017-12-28 NOTE — Telephone Encounter (Signed)
Pt coming in for labs tomorrow, please place future orders. Thank you.  

## 2017-12-28 NOTE — Telephone Encounter (Signed)
Order placed for f/u labs.  

## 2017-12-29 ENCOUNTER — Other Ambulatory Visit (INDEPENDENT_AMBULATORY_CARE_PROVIDER_SITE_OTHER): Payer: PPO

## 2017-12-29 DIAGNOSIS — I10 Essential (primary) hypertension: Secondary | ICD-10-CM | POA: Diagnosis not present

## 2017-12-29 DIAGNOSIS — E785 Hyperlipidemia, unspecified: Secondary | ICD-10-CM

## 2017-12-29 DIAGNOSIS — E039 Hypothyroidism, unspecified: Secondary | ICD-10-CM | POA: Diagnosis not present

## 2017-12-29 LAB — BASIC METABOLIC PANEL
BUN: 25 mg/dL — AB (ref 6–23)
CO2: 30 mEq/L (ref 19–32)
Calcium: 9.2 mg/dL (ref 8.4–10.5)
Chloride: 105 mEq/L (ref 96–112)
Creatinine, Ser: 0.74 mg/dL (ref 0.40–1.20)
GFR: 82.82 mL/min (ref >60.00)
GLUCOSE: 95 mg/dL (ref 70–99)
Potassium: 4.9 mEq/L (ref 3.5–5.1)
Sodium: 140 mEq/L (ref 135–145)

## 2017-12-29 LAB — HEPATIC FUNCTION PANEL
ALBUMIN: 4.1 g/dL (ref 3.5–5.2)
ALT: 22 U/L (ref 0–35)
AST: 18 U/L (ref 0–37)
Alkaline Phosphatase: 58 U/L (ref 39–117)
Bilirubin, Direct: 0.1 mg/dL (ref 0.0–0.3)
Total Bilirubin: 0.5 mg/dL (ref 0.2–1.2)
Total Protein: 6.3 g/dL (ref 6.0–8.3)

## 2017-12-29 LAB — LIPID PANEL
CHOLESTEROL: 171 mg/dL (ref 0–200)
HDL: 80 mg/dL (ref >39.00)
LDL Cholesterol: 80 mg/dL (ref 0–99)
NonHDL: 91.43
Total CHOL/HDL Ratio: 2
Triglycerides: 59 mg/dL (ref 0.0–149.0)
VLDL: 11.8 mg/dL (ref 0.0–40.0)

## 2017-12-29 LAB — TSH: TSH: 3.56 u[IU]/mL (ref 0.35–4.50)

## 2018-01-01 ENCOUNTER — Encounter: Payer: Self-pay | Admitting: Internal Medicine

## 2018-01-01 ENCOUNTER — Ambulatory Visit: Payer: PPO | Admitting: Internal Medicine

## 2018-02-02 ENCOUNTER — Other Ambulatory Visit: Payer: Self-pay | Admitting: Internal Medicine

## 2018-02-05 ENCOUNTER — Encounter

## 2018-02-05 ENCOUNTER — Encounter: Payer: Self-pay | Admitting: Internal Medicine

## 2018-02-05 ENCOUNTER — Ambulatory Visit (INDEPENDENT_AMBULATORY_CARE_PROVIDER_SITE_OTHER): Payer: PPO | Admitting: Internal Medicine

## 2018-02-05 DIAGNOSIS — G479 Sleep disorder, unspecified: Secondary | ICD-10-CM | POA: Diagnosis not present

## 2018-02-05 DIAGNOSIS — Z9109 Other allergy status, other than to drugs and biological substances: Secondary | ICD-10-CM | POA: Diagnosis not present

## 2018-02-05 DIAGNOSIS — E039 Hypothyroidism, unspecified: Secondary | ICD-10-CM

## 2018-02-05 DIAGNOSIS — I10 Essential (primary) hypertension: Secondary | ICD-10-CM

## 2018-02-05 MED ORDER — ESZOPICLONE 3 MG PO TABS: ORAL_TABLET | ORAL | 1 refills | Status: DC

## 2018-02-05 NOTE — Assessment & Plan Note (Signed)
On thyroid replacement.  Follow tsh.  

## 2018-02-05 NOTE — Assessment & Plan Note (Signed)
Discussed with her today.  She is taking lunesta regularly.  Discussed adding melatonin.  Discussed behavioral modification.  Hopefully could decrease dose of lunesta in future.  Monitor for increased sedation.

## 2018-02-05 NOTE — Assessment & Plan Note (Signed)
Some drainage occasionally as outlined.  Nasal sprays help to control.  Does not feel needs anything more at this time.  Follow.

## 2018-02-05 NOTE — Assessment & Plan Note (Signed)
Blood pressure under good control.  Continue same medication regimen.  Follow pressures.  Follow metabolic panel.   

## 2018-02-05 NOTE — Progress Notes (Signed)
Patient ID: [name] [name], female   DOB: 1951-04-26, 67 y.o.   MRN: [id number]   Subjective:    Patient ID: [name] [name], female    DOB: [birth year], 67 y.o.   MRN: [id number]  HPI  Patient here for a scheduled follow up.  Reports some sinus drainage.  Using astelin and steroid nasal spray.  Helping.  When uses this controls her symptoms.   Recent labs revealed cholesterol improved.  She is still coping with her husband's death.  Has good support.  Still meeting with her support group.  Still has issues with sleeping - some nights worse than others.  Takes lunesta regularly. Wants to continue.  We discussed behavioral modification and trial of melatonin.  Hopefully could decrease lunesta dose in future.  Does not feel she needs anything more at this time.  No chest pain.  No sob.  Still exercising, but not as much.  Plans to exercise more.  Also plans to do better with watching her diet.  No abdominal pain.  Bowels moving.     Past Medical History:  Diagnosis Date  . Adenomatous polyp 2005  . Arrhythmia    H/O  . Basal cell carcinoma   . CTS (carpal tunnel syndrome)   . Environmental allergies   . History of chicken pox   . HTN (hypertension)   . Hypercholesterolemia   . Hypothyroidism    Past Surgical History:  Procedure Laterality Date  . BASAL CELL CARCINOMA EXCISION     right hip,abd, right arm, back neck  . BREAST CYST ASPIRATION Right    negative  . CTS releast Rt wrist    . lipoma removal     Rt shoulder   . MELANOMA EXCISION     right buttocks, top of scalp  . POLYPECTOMY     + TA  . TUBAL LIGATION  1982   Family History  Problem Relation Age of Onset  . Cervical cancer Mother   . Arthritis Mother   . Hypertension Mother   . Sudden death Mother        cerbral hemorrage  . Heart disease Father   . Cancer Father        lung cancer  . Diabetes Maternal Grandmother   . Breast cancer Paternal Aunt 74  . Colon cancer Neg Hx    Social History   Socioeconomic  History  . Marital status: Widowed    Spouse name: Not on file  . Number of children: 1  . Years of education: Not on file  . Highest education level: Not on file  Occupational History  . 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 Financial resource strain: Not on file  . Food insecurity:    Worry: Not on file    Inability: Not on file  . Transportation needs:    Medical: Not on file    Non-medical: Not on file  Tobacco Use  . Smoking status: Former Smoker    Types: Cigarettes    Last attempt to quit: 07/16/1980    Years since quitting: 37.5  . Smokeless tobacco: Never Used  Substance and Sexual Activity  . Alcohol use: Yes    Alcohol/week: 4.2 oz    Types: 7 Glasses of wine per week    Comment: 1 per day   . Drug use: No  . Sexual activity: Never  Lifestyle  . Physical activity:    Days per week: Not  on file    Minutes per session: Not on file  . Stress: Not on file  Relationships  . Social connections:    Talks on phone: Not on file    Gets together: Not on file    Attends religious service: Not on file    Active member of club or organization: Not on file    Attends meetings of clubs or organizations: Not on file    Relationship status: Not on file  Other Topics Concern  . Not on file  Social History Narrative   Married; 1 son.    Dental assistant   Daily caffeine use 2-3/day       Cell # [id number]    Outpatient Encounter Medications as of 02/05/2018  Medication Sig  . ammonium lactate (GERI-HYDROLAC 5) 5 % LOTN lotion Apply 1 application topically 2 (two) times daily.  Marland Kitchen azelastine (ASTELIN) 0.1 % nasal spray PLACE 1 SPRAY INTO BOTH NOSTRIL TWICE DAILY. USE AS DIRECTED.  Marland Kitchen BIOTIN PO Take 500 mg by mouth daily.  . cetirizine (ZYRTEC) 10 MG tablet Take 10 mg by mouth daily.  . cholecalciferol (VITAMIN D) 1000 units tablet Take 1,000 Units by mouth daily.  . clobetasol (TEMOVATE) 0.05 % external solution Apply 1  application topically daily.  . Eszopiclone 3 MG TABS Take immediately before bedtime  . fluticasone (FLONASE) 50 MCG/ACT nasal spray USE 2 SPRAYS IN EACH NOSTRIL EVERY DAY  . irbesartan (AVAPRO) 75 MG tablet TAKE 1/2 TO 1 TABLET DAILY AS DIRECTED  . Nutritional Supplements (NUTRITIONAL SUPPLEMENT PO) Take by mouth. DoTERRA: ALPHA CRS+, xEO MEGA, MICROPLEX MVp, ON GUARD (protective blend)  . Probiotic Product (PROBIOTIC-10 PO) Take by mouth.  . ranitidine (ZANTAC) 150 MG tablet Take by mouth 2 (two) times daily.   . rosuvastatin (CRESTOR) 5 MG tablet TAKE 1 TABLET DAILY  . SYNTHROID 150 MCG tablet Take 1 tablet (150 mcg total) by mouth daily.  . [DISCONTINUED] Eszopiclone 3 MG TABS TAKE 1 TABLET BY MOUTH AT BEDTIME. TAKE IMMEDIATELY BEFORE BEDTIME.   No facility-administered encounter medications on file as of 02/05/2018.     Review of Systems  Constitutional: Negative for appetite change and unexpected weight change.  HENT: Positive for congestion and postnasal drip. Negative for sinus pressure.   Respiratory: Negative for cough, chest tightness and shortness of breath.   Cardiovascular: Negative for chest pain, palpitations and leg swelling.  Gastrointestinal: Negative for abdominal pain, diarrhea, nausea and vomiting.  Genitourinary: Negative for difficulty urinating and dysuria.  Musculoskeletal: Negative for joint swelling and myalgias.  Skin: Negative for color change and rash.  Neurological: Negative for dizziness and light-headedness.       Occasional sinus headache.  Relieved with nasal sprays.  No headache currently.    Psychiatric/Behavioral: Negative for agitation and dysphoric mood.       Objective:     Blood pressure rechecked by me:  120/72  Physical Exam  Constitutional: She appears well-developed and well-nourished. No distress.  HENT:  Nose: Nose normal.  Mouth/Throat: Oropharynx is clear and moist.  Neck: Neck supple. No thyromegaly present.  Cardiovascular:  Normal rate and regular rhythm.  Pulmonary/Chest: Breath sounds normal. No respiratory distress. She has no wheezes.  Abdominal: Soft. Bowel sounds are normal. There is no tenderness.  Musculoskeletal: She exhibits no edema or tenderness.  Lymphadenopathy:    She has no cervical adenopathy.  Skin: No rash noted. No erythema.  Psychiatric: She has a normal mood and affect. Her behavior is normal.  BP 122/78 (BP Location: Left Arm, Patient Position: Sitting, Cuff Size: Normal)   Pulse 60   Temp 98.5 F (36.9 C) (Oral)   Resp 18   Wt 152 lb 9.6 oz (69.2 kg)   SpO2 98%   BMI 27.91 kg/m  Wt Readings from Last 3 Encounters:  02/05/18 152 lb 9.6 oz (69.2 kg)  08/31/17 150 lb 3.2 oz (68.1 kg)  07/18/17 149 lb (67.6 kg)     Lab Results  Component Value Date   WBC 6.0 03/01/2017   HGB 13.7 03/01/2017   HCT 40.5 03/01/2017   PLT 260.0 03/01/2017   GLUCOSE 95 12/29/2017   CHOL 171 12/29/2017   TRIG 59.0 12/29/2017   HDL 80.00 12/29/2017   LDLDIRECT 118.2 10/16/2006   LDLCALC 80 12/29/2017   ALT 22 12/29/2017   AST 18 12/29/2017   NA 140 12/29/2017   K 4.9 12/29/2017   CL 105 12/29/2017   CREATININE 0.74 12/29/2017   BUN 25 (H) 12/29/2017   CO2 30 12/29/2017   TSH 3.56 12/29/2017    US Breast Ltd Uni Right Inc Axilla  Result Date: 08/14/2017 CLINICAL DATA:  67 year old female presenting for annual bilateral mammogram and 1 year follow-up of a 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 EXAM: 2D DIGITAL DIAGNOSTIC BILATERAL MAMMOGRAM WITH CAD AND ADJUNCT TOMO ULTRASOUND RIGHT BREAST COMPARISON:  Previous exam(s). ACR Breast Density Category c: The breast tissue is heterogeneously dense, which may obscure small masses. FINDINGS: A stable circumscribed masses identified in the central right breast. No other suspicious masses or calcifications are identified in either breast. The parenchymal pattern is stable. Mammographic images were processed with CAD. Targeted ultrasound is performed,  showing a stable, circumscribed hypoechoic mass at the 7:30 position 3 cm from the nipple right. It measures 1.3 x 1.0 x 0.5 cm. This is unchanged given slight variances in positioning and technique. IMPRESSION: 1. 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 Recommendation is for final scratched sonographic follow-up in 1 year to coincide with the patient's annual bilateral mammogram. 2. No mammographic evidence of malignancy on the left. RECOMMENDATION: Diagnostic bilateral mammogram and right breast ultrasound in 1 year. I have discussed the findings and recommendations with the patient. Results were also provided in writing at the conclusion of the visit. If applicable, a reminder letter will be sent to the patient regarding the next appointment. BI-RADS CATEGORY  3: Probably benign. Electronically Signed   By: Kristopher Oppenheim M.D.   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 EXAM: 2D DIGITAL DIAGNOSTIC BILATERAL MAMMOGRAM WITH CAD AND ADJUNCT TOMO ULTRASOUND RIGHT BREAST COMPARISON:  Previous exam(s). ACR Breast Density Category c: The breast tissue is heterogeneously dense, which may obscure small masses. FINDINGS: A stable circumscribed masses identified in the central right breast. No other suspicious masses or calcifications are identified in either breast. The parenchymal pattern is stable. Mammographic images were processed with CAD. Targeted ultrasound is performed, showing a stable, circumscribed hypoechoic mass at the 7:30 position 3 cm from the nipple right. It measures 1.3 x 1.0 x 0.5 cm. This is unchanged given slight variances in positioning and technique. IMPRESSION: 1. 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 Recommendation is for final scratched  sonographic follow-up in 1 year to coincide with the patient's annual bilateral mammogram. 2. No mammographic evidence of  malignancy on the left. RECOMMENDATION: Diagnostic bilateral mammogram and right breast ultrasound in 1 year. I have discussed the findings and recommendations with the patient. Results were also provided in writing at the conclusion of the visit. If applicable, a reminder letter will be sent to the patient regarding the next appointment. BI-RADS CATEGORY  3: Probably benign. Electronically Signed   By: Kristopher Oppenheim M.D.   On: 08/14/2017 11:46       Assessment & Plan:   Problem List Items Addressed This Visit    Environmental allergies    Some drainage occasionally as outlined.  Nasal sprays help to control.  Does not feel needs anything more at this time.  Follow.        Essential hypertension    Blood pressure under good control.  Continue same medication regimen.  Follow pressures.  Follow metabolic panel.        Hypothyroidism    On thyroid replacement.  Follow tsh.       Sleeping difficulty    Discussed with her today.  She is taking lunesta regularly.  Discussed adding melatonin.  Discussed behavioral modification.  Hopefully could decrease dose of lunesta in future.  Monitor for increased sedation.            Einar Pheasant, MD

## 2018-02-12 ENCOUNTER — Encounter: Payer: Self-pay | Admitting: Internal Medicine

## 2018-02-19 ENCOUNTER — Ambulatory Visit (INDEPENDENT_AMBULATORY_CARE_PROVIDER_SITE_OTHER): Payer: PPO

## 2018-02-19 VITALS — BP 118/62 | HR 66 | Temp 98.6°F | Resp 14 | Ht 62.5 in | Wt 153.8 lb

## 2018-02-19 DIAGNOSIS — Z0000 Encounter for general adult medical examination without abnormal findings: Secondary | ICD-10-CM

## 2018-02-19 NOTE — Patient Instructions (Addendum)
  [name] [name] , Thank you for taking time to come for your Medicare Wellness Visit. I appreciate your ongoing commitment to your health goals. Please review the following plan we discussed and let me know if I can assist you in the future.   These are the goals we discussed: Goals    . lose 10lbs     Current weight 153.8lb Low carb foods Portion control       This is a list of the screening recommended for you and due dates:  Health Maintenance  Topic Date Due  . Flu Shot  03/15/2018  . Mammogram  08/14/2018  . Tetanus Vaccine  05/19/2021  . Colon Cancer Screening  07/27/2023  . DEXA scan (bone density measurement)  Completed  .  Hepatitis C: One time screening is recommended by Center for Disease Control  (CDC) for  adults born from [birth year] through 1965.   Completed  . Pneumonia vaccines  Completed

## 2018-02-19 NOTE — Progress Notes (Addendum)
Subjective:   [name] [name] is a 67 y.o. female who presents for Medicare Annual (Subsequent) preventive examination.  Review of Systems:  No ROS.  Medicare Wellness Visit. Additional risk factors are reflected in the social history.  Cardiac Risk Factors include: advanced age (>60men, >62 women);hypertension     Objective:     Vitals: BP 118/62 (BP Location: Left Arm, Patient Position: Sitting, Cuff Size: Normal)   Pulse 66   Temp 98.6 F (37 C) (Oral)   Resp 14   Ht 5' 2.5" (1.588 m)   Wt 153 lb 12.8 oz (69.8 kg)   SpO2 97%   BMI 27.68 kg/m   Body mass index is 27.68 kg/m.  Advanced Directives 02/19/2018 02/17/2017  Does Patient Have a Medical Advance Directive? Yes Yes  Type of Advance Directive Living will;Healthcare Power of [place];Living will  Does patient want to make changes to medical advance directive? No - Patient declined No - Patient declined  Copy of [place] in Chart? Yes No - copy requested    Tobacco Social History   Tobacco Use  Smoking Status Former Smoker  . Types: Cigarettes  . Last attempt to quit: 07/16/1980  . Years since quitting: 37.6  Smokeless Tobacco Never Used     Counseling given: Not Answered   Clinical Intake:  Pre-visit preparation completed: Yes  Pain : No/denies pain     Nutritional Status: BMI 25 -29 Overweight Diabetes: No  How often do you need to have someone help you when you read instructions, pamphlets, or other written materials from your doctor or pharmacy?: 1 - Never  Interpreter Needed?: No     Past Medical History:  Diagnosis Date  . Adenomatous polyp 2005  . Arrhythmia    H/O  . Basal cell carcinoma   . CTS (carpal tunnel syndrome)   . Environmental allergies   . History of chicken pox   . HTN (hypertension)   . Hypercholesterolemia   . Hypothyroidism    Past Surgical History:  Procedure Laterality Date  . BASAL CELL CARCINOMA EXCISION     right hip,abd, right arm, back neck  . BREAST CYST ASPIRATION Right    negative  . CTS releast Rt wrist    . lipoma removal     Rt shoulder   . MELANOMA EXCISION     right buttocks, top of scalp  . POLYPECTOMY     + TA  . TUBAL LIGATION  1982   Family History  Problem Relation Age of Onset  . Cervical cancer Mother   . Arthritis Mother   . Hypertension Mother   . Sudden death Mother        cerbral hemorrage  . Heart disease Father   . Cancer Father        lung cancer  . Diabetes Maternal Grandmother   . Breast cancer Paternal Aunt 74  . Colon cancer Neg Hx    Social History   Socioeconomic History  . Marital status: Widowed    Spouse name: Not on file  . Number of children: 1  . Years of education: Not on file  . Highest education level: Not on file  Occupational History  . 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 Financial resource strain: Not hard at all  . Food insecurity:    Worry: Never true    Inability: Never true  . Transportation needs:  Medical: No    Non-medical: No  Tobacco Use  . Smoking status: Former Smoker    Types: Cigarettes    Last attempt to quit: 07/16/1980    Years since quitting: 37.6  . Smokeless tobacco: Never Used  Substance and Sexual Activity  . Alcohol use: Yes    Alcohol/week: 4.2 oz    Types: 7 Glasses of wine per week    Comment: 1 per day   . Drug use: No  . Sexual activity: Never  Lifestyle  . Physical activity:    Days per week: 4 days    Minutes per session: 60 min  . Stress: Not at all  Relationships  . Social connections:    Talks on phone: Not on file    Gets together: Not on file    Attends religious service: Not on file    Active member of club or organization: Not on file    Attends meetings of clubs or organizations: Not on file    Relationship status: Not on file  Other Topics Concern  . Not on file  Social History Narrative   Widowed; 1 son.    Retired  Art therapist   Daily caffeine use 2-3/day       Cell # [id number]    Outpatient Encounter Medications as of 02/19/2018  Medication Sig  . ammonium lactate (GERI-HYDROLAC 5) 5 % LOTN lotion Apply 1 application topically 2 (two) times daily.  Marland Kitchen azelastine (ASTELIN) 0.1 % nasal spray PLACE 1 SPRAY INTO BOTH NOSTRIL TWICE DAILY. USE AS DIRECTED.  Marland Kitchen BIOTIN PO Take 500 mg by mouth daily.  . cetirizine (ZYRTEC) 10 MG tablet Take 10 mg by mouth daily.  . cholecalciferol (VITAMIN D) 1000 units tablet Take 1,000 Units by mouth daily.  . clobetasol (TEMOVATE) 0.05 % external solution Apply 1 application topically daily.  . Eszopiclone 3 MG TABS Take immediately before bedtime  . fluticasone (FLONASE) 50 MCG/ACT nasal spray USE 2 SPRAYS IN EACH NOSTRIL EVERY DAY  . irbesartan (AVAPRO) 75 MG tablet TAKE 1/2 TO 1 TABLET DAILY AS DIRECTED  . Nutritional Supplements (NUTRITIONAL SUPPLEMENT PO) Take by mouth. DoTERRA: ALPHA CRS+, xEO MEGA, MICROPLEX MVp, ON GUARD (protective blend)  . Probiotic Product (PROBIOTIC-10 PO) Take by mouth.  . ranitidine (ZANTAC) 150 MG tablet Take by mouth 2 (two) times daily.   . rosuvastatin (CRESTOR) 5 MG tablet TAKE 1 TABLET DAILY  . SYNTHROID 150 MCG tablet Take 1 tablet (150 mcg total) by mouth daily.   No facility-administered encounter medications on file as of 02/19/2018.     Activities of Daily Living In your present state of health, do you have any difficulty performing the following activities: 02/19/2018  Hearing? N  Vision? N  Difficulty concentrating or making decisions? N  Walking or climbing stairs? N  Dressing or bathing? N  Doing errands, shopping? N  Preparing Food and eating ? N  Using the Toilet? N  In the past six months, have you accidently leaked urine? N  Do you have problems with loss of bowel control? N  Managing your Medications? N  Managing your Finances? N  Housekeeping or managing your Housekeeping? N  Some recent data might be hidden      Patient Care Team: Einar Pheasant, MD as PCP - General (Internal Medicine)    Assessment:   This is a routine wellness examination for [name] [name].  The goal of the wellness visit is to assist the patient how to close the gaps  in care and create a preventative care plan for the patient.   The roster of all physicians providing medical care to patient is listed in the Snapshot section of the chart.  Taking calcium VIT D as appropriate/Osteoporosis risk reviewed.    Safety issues reviewed; Lives alone. Smoke and carbon monoxide detectors in the home. No firearms in the home. Wears seatbelts when driving or riding with others. No violence in the home.  They do not have excessive sun exposure.  Discussed the need for sun protection: hats, long sleeves and the use of sunscreen if there is significant sun exposure.  Patient is alert, normal appearance, oriented to person/place/and time. Correctly identified the president of the [place] and recalls of 3/3 words.Performs simple calculations and can read correct time from watch face. Displays appropriate judgement.  No new identified risk were noted.  No failures at ADL's or IADL's.    BMI- discussed the importance of a healthy diet, water intake and the benefits of aerobic exercise. Educational material provided.   24 hour diet recall: Regular diet  Dental- every 6 months. Dr. Albesa Seen.  Eye- Visual acuity not assessed per patient preference since they have regular follow up with the ophthalmologist.  Wears corrective lenses.  Sleep patterns- Sleeps 7 hours at night.  Wakes feeling rested.   Health maintenance gaps- closed.  Patient Concerns: None at this time. Follow up with PCP as needed.  Exercise Activities and Dietary recommendations Current Exercise Habits: Home exercise routine, Type of exercise: walking;treadmill;strength training/weights, Time (Minutes): 60, Frequency (Times/Week): 4, Weekly Exercise (Minutes/Week): 240  Goals     . lose 10lbs     Current weight 153.8lb Low carb foods Portion control       Fall Risk Fall Risk  02/19/2018 02/17/2017 07/22/2016 06/08/2016 06/25/2015  Falls in the past year? No No No No No   Depression Screen PHQ 2/9 Scores 02/19/2018 08/31/2017 02/17/2017 07/22/2016  PHQ - 2 Score 0 0 - 0  PHQ- 9 Score - 1 - -  Exception Documentation - - Other- indicate reason in comment box -  Not completed - - Recent loss of spouse.  Currently in grief counseling.  Followed by PCP. -     Cognitive Function MMSE - Mini Mental State Exam 02/19/2018 02/17/2017  Orientation to time 5 5  Orientation to Place 5 5  Registration 3 3  Attention/ Calculation 5 5  Recall 3 3  Language- name 2 objects 2 2  Language- repeat 1 1  Language- follow 3 step command 3 3  Language- read & follow direction 1 1  Write a sentence 1 1  Copy design 1 1  Total score 30 30        Immunization History  Administered Date(s) Administered  . Influenza Split 05/06/2014  . Influenza, High Dose Seasonal PF 04/14/2016  . Influenza,inj,Quad PF,6+ Mos 05/13/2015  . Pneumococcal Conjugate-13 06/20/2014  . Pneumococcal Polysaccharide-23 06/25/2015  . Tdap 05/20/2011  . Zoster 09/20/2013   Screening Tests Health Maintenance  Topic Date Due  . INFLUENZA VACCINE  03/15/2018  . MAMMOGRAM  08/14/2018  . TETANUS/TDAP  05/19/2021  . COLONOSCOPY  07/27/2023  . DEXA SCAN  Completed  . Hepatitis C Screening  Completed  . PNA vac Low Risk Adult  Completed      Plan:    End of life planning; Advance aging; Advanced directives discussed. Copy of current HCPOA/Living Will on file.    I have personally reviewed and noted the following  in the patient's chart:   . Medical and social history . Use of alcohol, tobacco or illicit drugs  . Current medications and supplements . Functional ability and status . Nutritional status . Physical activity . Advanced directives . List of other physicians . Hospitalizations,  surgeries, and ER visits in previous 12 months . Vitals . Screenings to include cognitive, depression, and falls . Referrals and appointments  In addition, I have reviewed and discussed with patient certain preventive protocols, quality metrics, and best practice recommendations. A written personalized care plan for preventive services as well as general preventive health recommendations were provided to patient.     Varney Biles, LPN  09/15/9756   Reviewed above information.  Agree with assessment and plan.    Dr Nicki Reaper

## 2018-02-27 ENCOUNTER — Other Ambulatory Visit: Payer: Self-pay | Admitting: Internal Medicine

## 2018-03-07 ENCOUNTER — Other Ambulatory Visit: Payer: Self-pay | Admitting: Internal Medicine

## 2018-03-13 ENCOUNTER — Other Ambulatory Visit: Payer: Self-pay | Admitting: Internal Medicine

## 2018-04-23 DIAGNOSIS — Z85828 Personal history of other malignant neoplasm of skin: Secondary | ICD-10-CM | POA: Diagnosis not present

## 2018-04-23 DIAGNOSIS — L638 Other alopecia areata: Secondary | ICD-10-CM | POA: Diagnosis not present

## 2018-04-23 DIAGNOSIS — Z859 Personal history of malignant neoplasm, unspecified: Secondary | ICD-10-CM | POA: Diagnosis not present

## 2018-04-23 DIAGNOSIS — Z872 Personal history of diseases of the skin and subcutaneous tissue: Secondary | ICD-10-CM | POA: Diagnosis not present

## 2018-04-23 DIAGNOSIS — Z85820 Personal history of malignant melanoma of skin: Secondary | ICD-10-CM | POA: Diagnosis not present

## 2018-04-23 DIAGNOSIS — Z1283 Encounter for screening for malignant neoplasm of skin: Secondary | ICD-10-CM | POA: Diagnosis not present

## 2018-04-23 DIAGNOSIS — L578 Other skin changes due to chronic exposure to nonionizing radiation: Secondary | ICD-10-CM | POA: Diagnosis not present

## 2018-04-23 DIAGNOSIS — L570 Actinic keratosis: Secondary | ICD-10-CM | POA: Diagnosis not present

## 2018-05-14 ENCOUNTER — Encounter: Payer: Self-pay | Admitting: Internal Medicine

## 2018-07-23 ENCOUNTER — Telehealth: Payer: Self-pay

## 2018-07-23 NOTE — Telephone Encounter (Signed)
Copied from [place] [id number]. Topic: Appointment Scheduling - Scheduling Inquiry for Clinic >> Jul 23, 2018 12:10 PM Rutherford Nail, [place] wrote: Reason for CRM: Patient calling and would like to be fit in on Dr Bary Leriche schedule in the next week for so. States that she has some alopecia/hair loss going on and her fingernails are peeling. Please advise.

## 2018-07-24 ENCOUNTER — Encounter: Payer: Self-pay | Admitting: Internal Medicine

## 2018-07-24 ENCOUNTER — Ambulatory Visit (INDEPENDENT_AMBULATORY_CARE_PROVIDER_SITE_OTHER): Payer: PPO | Admitting: Internal Medicine

## 2018-07-24 DIAGNOSIS — L659 Nonscarring hair loss, unspecified: Secondary | ICD-10-CM | POA: Diagnosis not present

## 2018-07-24 DIAGNOSIS — I10 Essential (primary) hypertension: Secondary | ICD-10-CM | POA: Diagnosis not present

## 2018-07-24 DIAGNOSIS — F419 Anxiety disorder, unspecified: Secondary | ICD-10-CM

## 2018-07-24 DIAGNOSIS — E785 Hyperlipidemia, unspecified: Secondary | ICD-10-CM | POA: Diagnosis not present

## 2018-07-24 DIAGNOSIS — E039 Hypothyroidism, unspecified: Secondary | ICD-10-CM

## 2018-07-24 DIAGNOSIS — G479 Sleep disorder, unspecified: Secondary | ICD-10-CM

## 2018-07-24 MED ORDER — ESZOPICLONE 3 MG PO TABS: ORAL_TABLET | ORAL | 1 refills | Status: DC

## 2018-07-24 MED ORDER — MUPIROCIN 2 % EX OINT: TOPICAL_OINTMENT | CUTANEOUS | 0 refills | Status: DC

## 2018-07-24 NOTE — Telephone Encounter (Signed)
Scheduled pt for this afternoon.

## 2018-07-24 NOTE — Telephone Encounter (Signed)
See if she can come in this pm for work in appt.

## 2018-07-24 NOTE — Telephone Encounter (Signed)
Pt is coming in today

## 2018-07-24 NOTE — Progress Notes (Signed)
Patient ID: [name] [name], female   DOB: 11-03-1950, 67 y.o.   MRN: [id number]   Subjective:    Patient ID: [name] [name], female    DOB: 10/31/1950, 67 y.o.   MRN: [id number]  HPI  Patient here as a work in with concerns regarding persistent hair loss and "peeling" and cracking of her finger nails.  She has had issues with her hair.  Has seen dermatology.  Has alopecia.  This is more prominent - posterior/top of head.  She also reports her hair is thinning.  Has previously been on rogaine.  Stopped and started using essential oils.  Did not help, so now she is back on rogaine.  Sees Dr Phillip Heal.  She was previously using NuNail.  Did not feel helped.  On questioning, she has been under increased stress, but overall handling things well.  She wears gloves when working outside.  Does not wear gloves with washing dishes, but does not wash an increased amount of dishes. The peeling appears to involve the distal part of the nail.  No infection.  Does report some irritation - nares - external.  Has been using steroid cream daily.     Past Medical History:  Diagnosis Date  . Adenomatous polyp 2005  . Arrhythmia    H/O  . Basal cell carcinoma   . CTS (carpal tunnel syndrome)   . Environmental allergies   . History of chicken pox   . HTN (hypertension)   . Hypercholesterolemia   . Hypothyroidism    Past Surgical History:  Procedure Laterality Date  . BASAL CELL CARCINOMA EXCISION     right hip,abd, right arm, back neck  . BREAST CYST ASPIRATION Right    negative  . CTS releast Rt wrist    . lipoma removal     Rt shoulder   . MELANOMA EXCISION     right buttocks, top of scalp  . POLYPECTOMY     + TA  . TUBAL LIGATION  1982   Family History  Problem Relation Age of Onset  . Cervical cancer Mother   . Arthritis Mother   . Hypertension Mother   . Sudden death Mother        cerbral hemorrage  . Heart disease Father   . Cancer Father        lung cancer  . Diabetes Maternal  Grandmother   . Breast cancer Paternal Aunt 74  . Colon cancer Neg Hx    Social History   Socioeconomic History  . Marital status: Widowed    Spouse name: Not on file  . Number of children: 1  . Years of education: Not on file  . Highest education level: Not on file  Occupational History  . 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 Financial resource strain: Not hard at all  . Food insecurity:    Worry: Never true    Inability: Never true  . Transportation needs:    Medical: No    Non-medical: No  Tobacco Use  . Smoking status: Former Smoker    Types: Cigarettes    Last attempt to quit: 07/16/1980    Years since quitting: 38.0  . Smokeless tobacco: Never Used  Substance and Sexual Activity  . Alcohol use: Yes    Alcohol/week: 7.0 standard drinks    Types: 7 Glasses of wine per week    Comment: 1 per day   . Drug use: No  .  Sexual activity: Never  Lifestyle  . Physical activity:    Days per week: 4 days    Minutes per session: 60 min  . Stress: Not at all  Relationships  . Social connections:    Talks on phone: Not on file    Gets together: Not on file    Attends religious service: Not on file    Active member of club or organization: Not on file    Attends meetings of clubs or organizations: Not on file    Relationship status: Not on file  Other Topics Concern  . Not on file  Social History Narrative   Widowed; 1 son.    Retired Art therapist   Daily caffeine use 2-3/day       Cell # [id number]    Outpatient Encounter Medications as of 07/24/2018  Medication Sig  . famotidine (PEPCID) 20 MG tablet Take 20 mg by mouth daily.  Marland Kitchen ammonium lactate (GERI-HYDROLAC 5) 5 % LOTN lotion Apply 1 application topically 2 (two) times daily.  Marland Kitchen azelastine (ASTELIN) 0.1 % nasal spray PLACE 1 SPRAY INTO BOTH NOSTRIL TWICE DAILY. USE AS DIRECTED.  Marland Kitchen BIOTIN PO Take 500 mg by mouth daily.  . cetirizine (ZYRTEC) 10 MG tablet Take 10  mg by mouth daily.  . cholecalciferol (VITAMIN D) 1000 units tablet Take 1,000 Units by mouth daily.  . clobetasol (TEMOVATE) 0.05 % external solution Apply 1 application topically daily.  . Eszopiclone 3 MG TABS Take immediately before bedtime  . fluticasone (FLONASE) 50 MCG/ACT nasal spray USE 2 SPRAYS IN EACH NOSTRIL EVERY DAY  . irbesartan (AVAPRO) 75 MG tablet TAKE ONE TABLET EVERY DAY AS DIRECTED  . mupirocin ointment (BACTROBAN) 2 % Apply to affected area bid  . Nutritional Supplements (NUTRITIONAL SUPPLEMENT PO) Take by mouth. DoTERRA: ALPHA CRS+, xEO MEGA, MICROPLEX MVp, ON GUARD (protective blend)  . Probiotic Product (PROBIOTIC-10 PO) Take by mouth.  . rosuvastatin (CRESTOR) 5 MG tablet TAKE 1 TABLET DAILY  . SYNTHROID 150 MCG tablet TAKE 1 TABLET DAILY  . [DISCONTINUED] Eszopiclone 3 MG TABS Take immediately before bedtime  . [DISCONTINUED] ranitidine (ZANTAC) 150 MG tablet Take by mouth 2 (two) times daily.    No facility-administered encounter medications on file as of 07/24/2018.     Review of Systems  Constitutional: Negative for appetite change and unexpected weight change.  Respiratory: Negative for cough, chest tightness and shortness of breath.   Cardiovascular: Negative for chest pain and leg swelling.  Gastrointestinal: Negative for abdominal pain, nausea and vomiting.  Musculoskeletal: Negative for joint swelling and myalgias.  Skin: Negative for color change and rash.  Neurological: Negative for dizziness and headaches.  Psychiatric/Behavioral: Negative for agitation and dysphoric mood.       Increased stress as outlined.         Objective:    Physical Exam  Constitutional: She appears well-developed and well-nourished. No distress.  HENT:  Nose: Nose normal.  Mouth/Throat: Oropharynx is clear and moist.  Neck: Neck supple.  Cardiovascular: Normal rate and regular rhythm.  Pulmonary/Chest: Breath sounds normal. No respiratory distress. She has no  wheezes.  Abdominal: There is no tenderness.  Lymphadenopathy:    She has no cervical adenopathy.  Skin: No rash noted. No erythema.  Nails - no evidence of infection.  Some cracking very distal nail, but nail appears healthy.  No fungus.  Hair - thinning.  No evidence of scalp irritation or increased erythema.    Psychiatric: She has a normal  mood and affect. Her behavior is normal.    BP 140/74 (BP Location: Left Arm, Patient Position: Sitting, Cuff Size: Normal)   Pulse 80   Temp 98.1 F (36.7 C) (Oral)   Resp 16   Wt 151 lb 6.4 oz (68.7 kg)   SpO2 96%   BMI 27.25 kg/m  Wt Readings from Last 3 Encounters:  07/24/18 151 lb 6.4 oz (68.7 kg)  02/19/18 153 lb 12.8 oz (69.8 kg)  02/05/18 152 lb 9.6 oz (69.2 kg)     Lab Results  Component Value Date   WBC 6.0 03/01/2017   HGB 13.7 03/01/2017   HCT 40.5 03/01/2017   PLT 260.0 03/01/2017   GLUCOSE 95 12/29/2017   CHOL 171 12/29/2017   TRIG 59.0 12/29/2017   HDL 80.00 12/29/2017   LDLDIRECT 118.2 10/16/2006   LDLCALC 80 12/29/2017   ALT 22 12/29/2017   AST 18 12/29/2017   NA 140 12/29/2017   K 4.9 12/29/2017   CL 105 12/29/2017   CREATININE 0.74 12/29/2017   BUN 25 (H) 12/29/2017   CO2 30 12/29/2017   TSH 3.56 12/29/2017    US Breast Ltd Uni Right Inc Axilla  Result Date: 08/14/2017 CLINICAL DATA:  67 year old female presenting for annual bilateral mammogram and 1 year follow-up of a 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 EXAM: 2D DIGITAL DIAGNOSTIC BILATERAL MAMMOGRAM WITH CAD AND ADJUNCT TOMO ULTRASOUND RIGHT BREAST COMPARISON:  Previous exam(s). ACR Breast Density Category c: The breast tissue is heterogeneously dense, which may obscure small masses. FINDINGS: A stable circumscribed masses identified in the central right breast. No other suspicious masses or calcifications are identified in either breast. The parenchymal pattern is stable. Mammographic images were processed with CAD. Targeted ultrasound is performed,  showing a stable, circumscribed hypoechoic mass at the 7:30 position 3 cm from the nipple right. It measures 1.3 x 1.0 x 0.5 cm. This is unchanged given slight variances in positioning and technique. IMPRESSION: 1. 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 Recommendation is for final scratched sonographic follow-up in 1 year to coincide with the patient's annual bilateral mammogram. 2. No mammographic evidence of malignancy on the left. RECOMMENDATION: Diagnostic bilateral mammogram and right breast ultrasound in 1 year. I have discussed the findings and recommendations with the patient. Results were also provided in writing at the conclusion of the visit. If applicable, a reminder letter will be sent to the patient regarding the next appointment. BI-RADS CATEGORY  3: Probably benign. Electronically Signed   By: Kristopher Oppenheim M.D.   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 EXAM: 2D DIGITAL DIAGNOSTIC BILATERAL MAMMOGRAM WITH CAD AND ADJUNCT TOMO ULTRASOUND RIGHT BREAST COMPARISON:  Previous exam(s). ACR Breast Density Category c: The breast tissue is heterogeneously dense, which may obscure small masses. FINDINGS: A stable circumscribed masses identified in the central right breast. No other suspicious masses or calcifications are identified in either breast. The parenchymal pattern is stable. Mammographic images were processed with CAD. Targeted ultrasound is performed, showing a stable, circumscribed hypoechoic mass at the 7:30 position 3 cm from the nipple right. It measures 1.3 x 1.0 x 0.5 cm. This is unchanged given slight variances in positioning and technique. IMPRESSION: 1. 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 Recommendation is for final scratched  sonographic follow-up in 1 year to  coincide with the patient's annual bilateral mammogram. 2. No mammographic evidence of malignancy on the left. RECOMMENDATION: Diagnostic bilateral mammogram and right breast ultrasound in 1 year. I have discussed the findings and recommendations with the patient. Results were also provided in writing at the conclusion of the visit. If applicable, a reminder letter will be sent to the patient regarding the next appointment. BI-RADS CATEGORY  3: Probably benign. Electronically Signed   By: Kristopher Oppenheim M.D.   On: 08/14/2017 11:46       Assessment & Plan:   Problem List Items Addressed This Visit    Alopecia    Has a history of alopecia.  Has been followed by dermatology.  Has received steroid injections.  Continue f/u with dermatology.        Anxiety    Increased stress, but overall appears to be handling things well.  Follow.        Essential hypertension    Slight elevation today.  Has been under good control.  Follow pressures.  Follow metabolic panel.  Continue current medication regimen.        Relevant Orders   CBC with Differential/Platelet   Basic metabolic panel   Hair thinning    Has the history of alopecia.  Also now with increased problems with thinning hair and hair loss. Has also noticed changes in her nails as outlined.   Unclear etiology.  Will check cbc, thyroid, etc - to confirm no metabolic etiology.  Discussed the need for f/u with dermatology.        Relevant Orders   TSH   Vitamin B12   VITAMIN D 25 Hydroxy (Vit-D Deficiency, Fractures)   Hyperlipidemia    Low cholesterol diet and exercise.  Follow lipid panel.        Relevant Orders   Hepatic function panel   Lipid panel   Hypothyroidism    On thyroid replacement.  Recheck tsh to confirm wnl.        Sleeping difficulty    Continues on lunesta.  Follow.            Einar Pheasant, MD

## 2018-07-24 NOTE — Telephone Encounter (Signed)
Would you like for me to offer patient the 4:00 today as a work in for this?

## 2018-07-25 ENCOUNTER — Encounter: Payer: Self-pay | Admitting: Internal Medicine

## 2018-07-25 ENCOUNTER — Other Ambulatory Visit (INDEPENDENT_AMBULATORY_CARE_PROVIDER_SITE_OTHER): Payer: PPO

## 2018-07-25 DIAGNOSIS — I10 Essential (primary) hypertension: Secondary | ICD-10-CM | POA: Diagnosis not present

## 2018-07-25 DIAGNOSIS — E785 Hyperlipidemia, unspecified: Secondary | ICD-10-CM

## 2018-07-25 DIAGNOSIS — L659 Nonscarring hair loss, unspecified: Secondary | ICD-10-CM

## 2018-07-25 LAB — LIPID PANEL
CHOL/HDL RATIO: 2
CHOLESTEROL: 189 mg/dL (ref 0–200)
HDL: 78.1 mg/dL (ref >39.00)
LDL Cholesterol: 93 mg/dL (ref 0–99)
NonHDL: 111.09
TRIGLYCERIDES: 90 mg/dL (ref 0.0–149.0)
VLDL: 18 mg/dL (ref 0.0–40.0)

## 2018-07-25 LAB — CBC WITH DIFFERENTIAL/PLATELET
BASOS PCT: 0.3 % (ref 0.0–3.0)
Basophils Absolute: 0 10*3/uL (ref 0.0–0.1)
Eosinophils Absolute: 0.3 10*3/uL (ref 0.0–0.7)
Eosinophils Relative: 5.4 % — ABNORMAL HIGH (ref 0.0–5.0)
HEMATOCRIT: 40.7 % (ref 36.0–46.0)
HEMOGLOBIN: 13.9 g/dL (ref 12.0–15.0)
LYMPHS ABS: 1.6 10*3/uL (ref 0.7–4.0)
LYMPHS PCT: 28.3 % (ref 12.0–46.0)
MCHC: 34.1 g/dL (ref 30.0–36.0)
MCV: 87.4 fl (ref 78.0–100.0)
Monocytes Absolute: 0.5 10*3/uL (ref 0.1–1.0)
Monocytes Relative: 9 % (ref 3.0–12.0)
NEUTROS ABS: 3.3 10*3/uL (ref 1.4–7.7)
NEUTROS PCT: 57 % (ref 43.0–77.0)
Platelets: 252 10*3/uL (ref 150.0–400.0)
RBC: 4.65 Mil/uL (ref 3.87–5.11)
RDW: 12.7 % (ref 11.5–15.5)
WBC: 5.7 10*3/uL (ref 4.0–10.5)

## 2018-07-25 LAB — VITAMIN D 25 HYDROXY (VIT D DEFICIENCY, FRACTURES): VITD: 75.94 ng/mL (ref 30.00–100.00)

## 2018-07-25 LAB — VITAMIN B12: VITAMIN B 12: 469 pg/mL (ref 211–911)

## 2018-07-25 LAB — BASIC METABOLIC PANEL
BUN: 16 mg/dL (ref 6–23)
CO2: 31 mEq/L (ref 19–32)
CREATININE: 0.73 mg/dL (ref 0.40–1.20)
Calcium: 9.3 mg/dL (ref 8.4–10.5)
Chloride: 104 mEq/L (ref 96–112)
GFR: 83.99 mL/min (ref >60.00)
Glucose, Bld: 104 mg/dL — ABNORMAL HIGH (ref 70–99)
Potassium: 4.6 mEq/L (ref 3.5–5.1)
Sodium: 139 mEq/L (ref 135–145)

## 2018-07-25 LAB — TSH: TSH: 4.73 u[IU]/mL — ABNORMAL HIGH (ref 0.35–4.50)

## 2018-07-25 LAB — HEPATIC FUNCTION PANEL
ALT: 19 U/L (ref 0–35)
AST: 17 U/L (ref 0–37)
Albumin: 4.4 g/dL (ref 3.5–5.2)
Alkaline Phosphatase: 67 U/L (ref 39–117)
BILIRUBIN TOTAL: 0.5 mg/dL (ref 0.2–1.2)
Bilirubin, Direct: 0.1 mg/dL (ref 0.0–0.3)
TOTAL PROTEIN: 6.4 g/dL (ref 6.0–8.3)

## 2018-07-25 NOTE — Assessment & Plan Note (Signed)
Has a history of alopecia.  Has been followed by dermatology.  Has received steroid injections.  Continue f/u with dermatology.

## 2018-07-25 NOTE — Assessment & Plan Note (Signed)
Has the history of alopecia.  Also now with increased problems with thinning hair and hair loss. Has also noticed changes in her nails as outlined.   Unclear etiology.  Will check cbc, thyroid, etc - to confirm no metabolic etiology.  Discussed the need for f/u with dermatology.

## 2018-07-25 NOTE — Assessment & Plan Note (Signed)
Continues on lunesta.  Follow.  

## 2018-07-25 NOTE — Assessment & Plan Note (Signed)
Increased stress, but overall appears to be handling things well.  Follow.

## 2018-07-25 NOTE — Assessment & Plan Note (Signed)
On thyroid replacement.  Recheck tsh to confirm wnl.

## 2018-07-25 NOTE — Assessment & Plan Note (Signed)
Low cholesterol diet and exercise.  Follow lipid panel.   

## 2018-07-25 NOTE — Assessment & Plan Note (Signed)
Slight elevation today.  Has been under good control.  Follow pressures.  Follow metabolic panel.  Continue current medication regimen.

## 2018-07-26 ENCOUNTER — Other Ambulatory Visit: Payer: Self-pay

## 2018-07-26 MED ORDER — LEVOTHYROXINE SODIUM 175 MCG PO TABS: 175.0000 ug | ORAL_TABLET | Freq: Every day | ORAL | 0 refills | Status: DC

## 2018-08-01 ENCOUNTER — Other Ambulatory Visit: Payer: PPO

## 2018-08-03 ENCOUNTER — Other Ambulatory Visit: Payer: Self-pay | Admitting: Internal Medicine

## 2018-08-03 ENCOUNTER — Other Ambulatory Visit: Payer: Self-pay

## 2018-08-03 ENCOUNTER — Encounter: Payer: Self-pay | Admitting: Internal Medicine

## 2018-08-03 MED ORDER — ROSUVASTATIN CALCIUM 5 MG PO TABS: 5.0000 mg | ORAL_TABLET | Freq: Every day | ORAL | 4 refills | Status: DC

## 2018-08-13 ENCOUNTER — Other Ambulatory Visit: Payer: Self-pay | Admitting: Internal Medicine

## 2018-08-16 ENCOUNTER — Other Ambulatory Visit: Payer: Self-pay | Admitting: Internal Medicine

## 2018-08-16 DIAGNOSIS — N6310 Unspecified lump in the right breast, unspecified quadrant: Secondary | ICD-10-CM

## 2018-08-17 ENCOUNTER — Other Ambulatory Visit: Payer: PPO

## 2018-08-20 ENCOUNTER — Other Ambulatory Visit: Payer: Self-pay | Admitting: Internal Medicine

## 2018-08-30 ENCOUNTER — Ambulatory Visit: Payer: PPO | Admitting: Internal Medicine

## 2018-08-31 ENCOUNTER — Ambulatory Visit
Admission: RE | Admit: 2018-08-31 | Discharge: 2018-08-31 | Disposition: A | Discharge status: Home / Self Care | Payer: PPO | Source: Ambulatory Visit | Attending: Internal Medicine | Admitting: Internal Medicine

## 2018-08-31 DIAGNOSIS — N6310 Unspecified lump in the right breast, unspecified quadrant: Secondary | ICD-10-CM

## 2018-08-31 DIAGNOSIS — R922 Inconclusive mammogram: Secondary | ICD-10-CM | POA: Diagnosis not present

## 2018-08-31 DIAGNOSIS — N6313 Unspecified lump in the right breast, lower outer quadrant: Secondary | ICD-10-CM | POA: Diagnosis not present

## 2018-09-01 ENCOUNTER — Encounter: Payer: Self-pay | Admitting: Internal Medicine

## 2018-09-04 ENCOUNTER — Ambulatory Visit (INDEPENDENT_AMBULATORY_CARE_PROVIDER_SITE_OTHER): Payer: PPO | Admitting: Internal Medicine

## 2018-09-04 ENCOUNTER — Encounter: Payer: Self-pay | Admitting: Internal Medicine

## 2018-09-04 VITALS — BP 136/78 | HR 59 | Temp 97.4°F | Resp 16 | Ht 63.0 in | Wt 150.8 lb

## 2018-09-04 DIAGNOSIS — F419 Anxiety disorder, unspecified: Secondary | ICD-10-CM

## 2018-09-04 DIAGNOSIS — L659 Nonscarring hair loss, unspecified: Secondary | ICD-10-CM | POA: Diagnosis not present

## 2018-09-04 DIAGNOSIS — E785 Hyperlipidemia, unspecified: Secondary | ICD-10-CM | POA: Diagnosis not present

## 2018-09-04 DIAGNOSIS — I10 Essential (primary) hypertension: Secondary | ICD-10-CM

## 2018-09-04 DIAGNOSIS — G479 Sleep disorder, unspecified: Secondary | ICD-10-CM

## 2018-09-04 DIAGNOSIS — E039 Hypothyroidism, unspecified: Secondary | ICD-10-CM | POA: Diagnosis not present

## 2018-09-04 DIAGNOSIS — Z9109 Other allergy status, other than to drugs and biological substances: Secondary | ICD-10-CM | POA: Diagnosis not present

## 2018-09-04 DIAGNOSIS — Z0000 Encounter for general adult medical examination without abnormal findings: Secondary | ICD-10-CM

## 2018-09-04 LAB — TSH: TSH: 2.28 u[IU]/mL (ref 0.35–4.50)

## 2018-09-04 NOTE — Progress Notes (Signed)
Patient ID: [name] [name], female   DOB: [birth year], 68 y.o.   MRN: [id number]   Subjective:    Patient ID: [name] [name], female    DOB: Mar 07, 1951, 68 y.o.   MRN: [id number]  HPI  Patient here for her physical exam.  She reports she is doing well.  Feels good.  Is exercising.  No chest pain.  No sob.  No acid reflux.  No abdominal pain.  Bowels moving.  No urine change.  Adjusted thyroid medication last lab.  Nails are better.  Hair is not.  She has seen dermatology.   Rogaine.  Handling stress. Overall she feels she is doing well.     Past Medical History:  Diagnosis Date  . Adenomatous polyp 2005  . Arrhythmia    H/O  . Basal cell carcinoma   . CTS (carpal tunnel syndrome)   . Environmental allergies   . History of chicken pox   . HTN (hypertension)   . Hypercholesterolemia   . Hypothyroidism    Past Surgical History:  Procedure Laterality Date  . BASAL CELL CARCINOMA EXCISION     right hip,abd, right arm, back neck  . BREAST BIOPSY    . BREAST CYST ASPIRATION Right    negative  . CTS releast Rt wrist    . lipoma removal     Rt shoulder   . MELANOMA EXCISION     right buttocks, top of scalp  . POLYPECTOMY     + TA  . TUBAL LIGATION  1982   Family History  Problem Relation Age of Onset  . Cervical cancer Mother   . Arthritis Mother   . Hypertension Mother   . Sudden death Mother        cerbral hemorrage  . Heart disease Father   . Cancer Father        lung cancer  . Diabetes Maternal Grandmother   . Breast cancer Paternal Aunt 74  . Colon cancer Neg Hx    Social History   Socioeconomic History  . Marital status: Widowed    Spouse name: Not on file  . Number of children: 1  . Years of education: Not on file  . Highest education level: Not on file  Occupational History  . 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 Financial resource strain: Not hard at all  . Food insecurity:    Worry: Never true   Inability: Never true  . Transportation needs:    Medical: No    Non-medical: No  Tobacco Use  . Smoking status: Former Smoker    Types: Cigarettes    Last attempt to quit: 07/16/1980    Years since quitting: 38.1  . Smokeless tobacco: Never Used  Substance and Sexual Activity  . Alcohol use: Yes    Alcohol/week: 7.0 standard drinks    Types: 7 Glasses of wine per week    Comment: 1 per day   . Drug use: No  . Sexual activity: Never  Lifestyle  . Physical activity:    Days per week: 4 days    Minutes per session: 60 min  . Stress: Not at all  Relationships  . Social connections:    Talks on phone: Not on file    Gets together: Not on file    Attends religious service: Not on file    Active member of club or organization: Not on file    Attends meetings of  clubs or organizations: Not on file    Relationship status: Not on file  Other Topics Concern  . Not on file  Social History Narrative   Widowed; 1 son.    Retired Art therapist   Daily caffeine use 2-3/day       Cell # [id number]    Outpatient Encounter Medications as of 09/04/2018  Medication Sig  . Multiple Vitamins-Minerals (HAIR SKIN AND NAILS FORMULA PO) Take 1 tablet by mouth daily.  Marland Kitchen ammonium lactate (GERI-HYDROLAC 5) 5 % LOTN lotion Apply 1 application topically 2 (two) times daily.  . Azelastine HCl 137 MCG/SPRAY SOLN USE ONE SPRAY INTO EACH NOSTRIL TWICE A DAY  . cetirizine (ZYRTEC) 10 MG tablet Take 10 mg by mouth daily.  . cholecalciferol (VITAMIN D) 1000 units tablet Take 1,000 Units by mouth daily.  . clobetasol (TEMOVATE) 0.05 % external solution Apply 1 application topically daily.  . Eszopiclone 3 MG TABS Take immediately before bedtime  . famotidine (PEPCID) 20 MG tablet Take 20 mg by mouth daily.  . fluticasone (FLONASE) 50 MCG/ACT nasal spray USE 2 SPRAYS IN EACH NOSTRIL EVERY DAY  . hydrocortisone 2.5 % ointment   . irbesartan (AVAPRO) 75 MG tablet TAKE ONE TABLET EVERY DAY AS DIRECTED  .  mupirocin ointment (BACTROBAN) 2 % Apply to affected area bid  . Nutritional Supplements (NUTRITIONAL SUPPLEMENT PO) Take by mouth. DoTERRA: ALPHA CRS+, xEO MEGA, MICROPLEX MVp, ON GUARD (protective blend)  . Probiotic Product (PROBIOTIC-10 PO) Take by mouth.  . rosuvastatin (CRESTOR) 5 MG tablet Take 1 tablet (5 mg total) by mouth daily.  Marland Kitchen SYNTHROID 175 MCG tablet TAKE ONE TABLET BY MOUTH EVERY DAY BEFORE BREAKFAST  . [DISCONTINUED] BIOTIN PO Take 500 mg by mouth daily.   No facility-administered encounter medications on file as of 09/04/2018.     Review of Systems  Constitutional: Negative for appetite change and unexpected weight change.  HENT: Negative for congestion and sinus pressure.   Eyes: Negative for pain and visual disturbance.  Respiratory: Negative for cough, chest tightness and shortness of breath.   Cardiovascular: Negative for chest pain, palpitations and leg swelling.  Gastrointestinal: Negative for abdominal pain, diarrhea, nausea and vomiting.  Genitourinary: Negative for difficulty urinating and dysuria.  Musculoskeletal: Negative for back pain, joint swelling and myalgias.  Skin: Negative for color change and rash.  Neurological: Negative for dizziness, light-headedness and headaches.  Hematological: Negative for adenopathy. Does not bruise/bleed easily.  Psychiatric/Behavioral: Negative for agitation and dysphoric mood.       Objective:     Blood pressure rechecked by me:  120/72  Physical Exam Constitutional:      General: She is not in acute distress.    Appearance: Normal appearance. She is well-developed.  HENT:     Nose: Nose normal. No congestion.     Mouth/Throat:     Pharynx: No oropharyngeal exudate or posterior oropharyngeal erythema.  Eyes:     General: No scleral icterus.       Right eye: No discharge.        Left eye: No discharge.  Neck:     Musculoskeletal: Neck supple. No muscular tenderness.     Thyroid: No thyromegaly.    Cardiovascular:     Rate and Rhythm: Normal rate and regular rhythm.  Pulmonary:     Effort: No tachypnea, accessory muscle usage or respiratory distress.     Breath sounds: Normal breath sounds. No decreased breath sounds or wheezing.  Chest:  Breasts:        Right: No inverted nipple, mass, nipple discharge or tenderness (no axillary adenopathy).        Left: No inverted nipple, mass, nipple discharge or tenderness (no axilarry adenopathy).  Abdominal:     General: Bowel sounds are normal.     Palpations: Abdomen is soft.     Tenderness: There is no abdominal tenderness.  Musculoskeletal:        General: No swelling or tenderness.  Lymphadenopathy:     Cervical: No cervical adenopathy.  Skin:    General: Skin is warm.     Findings: No erythema or rash.  Neurological:     Mental Status: She is alert and oriented to person, place, and time.  Psychiatric:        Mood and Affect: Mood normal.        Behavior: Behavior normal.     BP 136/78 (BP Location: Left Arm, Patient Position: Sitting, Cuff Size: Normal)   Pulse (!) 59   Temp (!) 97.4 F (36.3 C) (Oral)   Resp 16   Ht 5\' 3"  (1.6 m)   Wt 150 lb 12.8 oz (68.4 kg)   SpO2 99%   BMI 26.71 kg/m  Wt Readings from Last 3 Encounters:  09/04/18 150 lb 12.8 oz (68.4 kg)  07/24/18 151 lb 6.4 oz (68.7 kg)  02/19/18 153 lb 12.8 oz (69.8 kg)     Lab Results  Component Value Date   WBC 5.7 07/25/2018   HGB 13.9 07/25/2018   HCT 40.7 07/25/2018   PLT 252.0 07/25/2018   GLUCOSE 104 (H) 07/25/2018   CHOL 189 07/25/2018   TRIG 90.0 07/25/2018   HDL 78.10 07/25/2018   LDLDIRECT 118.2 10/16/2006   LDLCALC 93 07/25/2018   ALT 19 07/25/2018   AST 17 07/25/2018   NA 139 07/25/2018   K 4.6 07/25/2018   CL 104 07/25/2018   CREATININE 0.73 07/25/2018   BUN 16 07/25/2018   CO2 31 07/25/2018   TSH 2.28 09/04/2018    US Breast Ltd Uni Right Inc Axilla  Result Date: 08/31/2018 CLINICAL DATA:  Short-term interval follow-up  of a 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 EXAM: DIGITAL DIAGNOSTIC BILATERAL MAMMOGRAM WITH CAD AND TOMO ULTRASOUND RIGHT BREAST COMPARISON:  Previous exam(s). ACR Breast Density Category c: The breast tissue is heterogeneously dense, which may obscure small masses. FINDINGS: Circumscribed mass in the central aspect of the right breast is stable. No suspicious mass or malignant type microcalcifications identified in either breast. Mammographic images were processed with CAD. Targeted ultrasound is performed, showing a well-circumscribed hypoechoic mass in the right breast at 7:30 3 cm from the nipple measured 1.1 x 0.3 x 0.9 cm. On the prior ultrasound dated 08/11/2016 it measured 1.3 x 0.4 x 1.0 cm. 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 No evidence of malignancy in either breast. RECOMMENDATION: Bilateral screening mammogram in 1 year is recommended. I have discussed the findings and recommendations with the patient. Results were also provided in writing at the conclusion of the visit. If applicable, a reminder letter will be sent to the patient regarding the next appointment. BI-RADS CATEGORY  2: Benign. Electronically Signed   By: Lillia Mountain M.D.   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 EXAM: DIGITAL DIAGNOSTIC BILATERAL MAMMOGRAM WITH CAD AND TOMO ULTRASOUND RIGHT BREAST  COMPARISON:  Previous exam(s). ACR Breast Density Category c: The breast tissue is heterogeneously dense, which may obscure small masses. FINDINGS: Circumscribed mass in the central aspect of the right breast is stable. No suspicious mass or malignant type microcalcifications identified in either breast. Mammographic images were processed with CAD. Targeted ultrasound is performed, showing a well-circumscribed hypoechoic mass in the right breast at 7:30 3 cm from the  nipple measured 1.1 x 0.3 x 0.9 cm. On the prior ultrasound dated 08/11/2016 it measured 1.3 x 0.4 x 1.0 cm. 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 No evidence of malignancy in either breast. RECOMMENDATION: Bilateral screening mammogram in 1 year is recommended. I have discussed the findings and recommendations with the patient. Results were also provided in writing at the conclusion of the visit. If applicable, a reminder letter will be sent to the patient regarding the next appointment. BI-RADS CATEGORY  2: Benign. Electronically Signed   By: Lillia Mountain M.D.   On: 08/31/2018 14:53       Assessment & Plan:   Problem List Items Addressed This Visit    Alopecia    Has a history of alopecia.  Seeing dermatology.       Anxiety    Overall she feels she is doing better.  Follow.        Environmental allergies    Controlled.       Essential hypertension    Blood pressure under good control.  Continue same medication regimen.  Follow pressures.  Follow metabolic panel.        Health care maintenance    Physical today 09/07/18.  Colonoscopy 07/2013.  Mammogram 08/31/18 -Birads II.  Recommended screening mammogram in one year.       Hyperlipidemia    Low cholesterol diet and exercise.  On crestor. Follow lipid panel and liver function tests.        Hypothyroidism - Primary    On thyroid replacement.  Just adjusted dose of thyroid medication.  Recheck tsh.        Relevant Orders   TSH (Completed)   Sleeping difficulty    Sleeping better.  Wants to taper down lunesta.  Follow.           Einar Pheasant, MD

## 2018-09-05 ENCOUNTER — Telehealth: Payer: Self-pay | Admitting: Radiology

## 2018-09-05 NOTE — Telephone Encounter (Signed)
She had this drawn yesterday when she was here.  Thanks.

## 2018-09-05 NOTE — Telephone Encounter (Signed)
Ok will cancel pt lab appt

## 2018-09-05 NOTE — Telephone Encounter (Signed)
Pt coming in for labs tomorrow, please place future orders. Thank you.  

## 2018-09-06 ENCOUNTER — Other Ambulatory Visit: Payer: PPO

## 2018-09-10 ENCOUNTER — Encounter: Payer: Self-pay | Admitting: Internal Medicine

## 2018-09-10 NOTE — Assessment & Plan Note (Signed)
Low cholesterol diet and exercise.  On crestor.  Follow lipid panel and liver function tests.  

## 2018-09-10 NOTE — Assessment & Plan Note (Signed)
Physical today 09/07/18.  Colonoscopy 07/2013.  Mammogram 08/31/18 -Birads II.  Recommended screening mammogram in one year.

## 2018-09-10 NOTE — Assessment & Plan Note (Signed)
Has a history of alopecia.  Seeing dermatology.

## 2018-09-10 NOTE — Assessment & Plan Note (Signed)
Sleeping better.  Wants to taper down lunesta.  Follow.

## 2018-09-10 NOTE — Assessment & Plan Note (Signed)
On thyroid replacement.  Just adjusted dose of thyroid medication.  Recheck tsh.

## 2018-09-10 NOTE — Assessment & Plan Note (Signed)
Overall she feels she is doing better.  Follow.

## 2018-09-10 NOTE — Assessment & Plan Note (Signed)
Controlled.  

## 2018-09-10 NOTE — Assessment & Plan Note (Signed)
Blood pressure under good control.  Continue same medication regimen.  Follow pressures.  Follow metabolic panel.   

## 2018-09-19 ENCOUNTER — Other Ambulatory Visit: Payer: Self-pay | Admitting: Internal Medicine

## 2018-09-20 ENCOUNTER — Other Ambulatory Visit: Payer: Self-pay

## 2018-09-20 NOTE — Patient Outreach (Signed)
[facility]) Care Management  09/20/2018  [name] [name] 1950-10-15 [id number]   Medication Adherence call to [name] [name] patient has a disconnected  Telephone number on Epic, call patient on a different number that [employer] provides [phone] no answer to the call.   [facility] Direct Dial [phone]  Fax [phone] [email or web address]

## 2018-09-21 DIAGNOSIS — H25013 Cortical age-related cataract, bilateral: Secondary | ICD-10-CM | POA: Diagnosis not present

## 2018-10-21 ENCOUNTER — Encounter: Payer: Self-pay | Admitting: Internal Medicine

## 2018-10-22 ENCOUNTER — Other Ambulatory Visit: Payer: Self-pay

## 2018-10-22 MED ORDER — SYNTHROID 175 MCG PO TABS: 175.0000 ug | ORAL_TABLET | Freq: Every day | ORAL | 1 refills | Status: DC

## 2018-10-22 MED ORDER — ROSUVASTATIN CALCIUM 5 MG PO TABS: 5.0000 mg | ORAL_TABLET | Freq: Every day | ORAL | 1 refills | Status: DC

## 2018-11-07 ENCOUNTER — Other Ambulatory Visit: Payer: Self-pay | Admitting: Internal Medicine

## 2018-11-07 NOTE — Telephone Encounter (Signed)
This pt is requesting eszopiclone, can I refill this or do you prefer to do it, I'm still not sure about the psychiatry medication please advise.  Marland Kitchen

## 2018-11-07 NOTE — Telephone Encounter (Signed)
rx ok'd for lunesta #30 with one refill.   

## 2018-11-08 ENCOUNTER — Encounter: Payer: Self-pay | Admitting: Internal Medicine

## 2018-11-08 ENCOUNTER — Other Ambulatory Visit: Payer: Self-pay

## 2018-12-12 ENCOUNTER — Other Ambulatory Visit: Payer: Self-pay | Admitting: Internal Medicine

## 2019-01-03 ENCOUNTER — Encounter: Payer: Self-pay | Admitting: Internal Medicine

## 2019-01-03 ENCOUNTER — Other Ambulatory Visit: Payer: Self-pay

## 2019-01-03 ENCOUNTER — Ambulatory Visit (INDEPENDENT_AMBULATORY_CARE_PROVIDER_SITE_OTHER): Payer: PPO | Admitting: Internal Medicine

## 2019-01-03 DIAGNOSIS — E785 Hyperlipidemia, unspecified: Secondary | ICD-10-CM

## 2019-01-03 DIAGNOSIS — T63421A Toxic effect of venom of ants, accidental (unintentional), initial encounter: Secondary | ICD-10-CM | POA: Diagnosis not present

## 2019-01-03 DIAGNOSIS — E039 Hypothyroidism, unspecified: Secondary | ICD-10-CM | POA: Diagnosis not present

## 2019-01-03 DIAGNOSIS — I10 Essential (primary) hypertension: Secondary | ICD-10-CM | POA: Diagnosis not present

## 2019-01-03 DIAGNOSIS — G479 Sleep disorder, unspecified: Secondary | ICD-10-CM

## 2019-01-03 NOTE — Progress Notes (Signed)
Patient ID: [name] [name], female   DOB: Jun 28, 1951, 68 y.o.   MRN: [id number]   Virtual Visit via video Note  This visit type was conducted due to national recommendations for restrictions regarding the COVID-19 pandemic (e.g. social distancing).  This format is felt to be most appropriate for this patient at this time.  All issues noted in this document were discussed and addressed.  No physical exam was performed (except for noted visual exam findings with Video Visits).   I connected with [name] by a video enabled telemedicine application or telephone and verified that I am speaking with the correct person using two identifiers. Location patient: home Location provider: work Persons participating in the virtual visit: patient, provider  I discussed the limitations, risks, security and privacy concerns of performing an evaluation and management service by video and the availability of in person appointments.  The patient expressed understanding and agreed to proceed.   Reason for visit: scheduled follow up.   HPI: Some increased stress helping care for her mother and father-n-law.  Overall she feels she is handling things relatively well.  Does not feel she needs anything more at this time.  Trying to stay active.  No chest pain.  No sob.  No acid reflux.  No abdominal pain.  Bowels moving.  Blood pressures have been averaging 130s/70s.  Did get bit by fire ants.  Persistent lesion - right ankle.  Walking 3 miles per day.     ROS: See pertinent positives and negatives per HPI.  Past Medical History:  Diagnosis Date  . Adenomatous polyp 2005  . Arrhythmia    H/O  . Basal cell carcinoma   . CTS (carpal tunnel syndrome)   . Environmental allergies   . History of chicken pox   . HTN (hypertension)   . Hypercholesterolemia   . Hypothyroidism     Past Surgical History:  Procedure Laterality Date  . BASAL CELL CARCINOMA EXCISION     right hip,abd, right arm, back neck  .  BREAST BIOPSY    . BREAST CYST ASPIRATION Right    negative  . CTS releast Rt wrist    . lipoma removal     Rt shoulder   . MELANOMA EXCISION     right buttocks, top of scalp  . POLYPECTOMY     + TA  . TUBAL LIGATION  1982    Family History  Problem Relation Age of Onset  . Cervical cancer Mother   . Arthritis Mother   . Hypertension Mother   . Sudden death Mother        cerbral hemorrage  . Heart disease Father   . Cancer Father        lung cancer  . Diabetes Maternal Grandmother   . Breast cancer Paternal Aunt 74  . Colon cancer Neg Hx     SOCIAL HX: reviewed.    Current Outpatient Medications:  .  ammonium lactate (GERI-HYDROLAC 5) 5 % LOTN lotion, Apply 1 application topically 2 (two) times daily., Disp: , Rfl:  .  Azelastine HCl 137 MCG/SPRAY SOLN, USE ONE SPRAY INTO EACH NOSTRIL TWICE A DAY, Disp: 30 mL, Rfl: 2 .  cetirizine (ZYRTEC) 10 MG tablet, Take 10 mg by mouth daily., Disp: , Rfl:  .  cholecalciferol (VITAMIN D) 1000 units tablet, Take 1,000 Units by mouth daily., Disp: , Rfl:  .  clobetasol (TEMOVATE) 0.05 % external solution, Apply 1 application topically daily., Disp: , Rfl:  .  Eszopiclone 3 MG TABS, TAKE 1 TABLET BY MOUTH IMMEDIATLEY BEFORE BEDTIME, Disp: 30 tablet, Rfl: 1 .  famotidine (PEPCID) 20 MG tablet, Take 20 mg by mouth daily., Disp: , Rfl:  .  fluticasone (FLONASE) 50 MCG/ACT nasal spray, USE 2 SPRAYS IN EACH NOSTRIL EVERY DAY, Disp: 16 g, Rfl: 1 .  hydrocortisone 2.5 % ointment, , Disp: , Rfl:  .  irbesartan (AVAPRO) 75 MG tablet, TAKE 1 TABLET BY MOUTH DAILY, Disp: 90 tablet, Rfl: 1 .  Multiple Vitamin (OMNICAP) TABS, Take by mouth., Disp: , Rfl:  .  Multiple Vitamins-Minerals (HAIR SKIN AND NAILS FORMULA PO), Take 1 tablet by mouth daily., Disp: , Rfl:  .  mupirocin ointment (BACTROBAN) 2 %, Apply to affected area bid, Disp: 22 g, Rfl: 0 .  Nutritional Supplements (NUTRITIONAL SUPPLEMENT PO), Take by mouth. DoTERRA: ALPHA CRS+, xEO MEGA,  MICROPLEX MVp, ON GUARD (protective blend), Disp: , Rfl:  .  Probiotic Product (PROBIOTIC-10 PO), Take by mouth., Disp: , Rfl:  .  rosuvastatin (CRESTOR) 5 MG tablet, Take 1 tablet (5 mg total) by mouth daily., Disp: 90 tablet, Rfl: 1 .  SYNTHROID 175 MCG tablet, Take 1 tablet (175 mcg total) by mouth daily before breakfast., Disp: 90 tablet, Rfl: 1 .  UNABLE TO FIND, Take by mouth 3 (three) times daily. Collegen supplements, Disp: , Rfl:   EXAM:  VITALS per patient if applicable: weight 937 pounds.  Blood pressure 132/77 and pulse 58.    GENERAL: alert, oriented, appears well and in no acute distress  HEENT: atraumatic, conjunttiva clear, no obvious abnormalities on inspection of external nose and ears  NECK: normal movements of the head and neck  LUNGS: on inspection no signs of respiratory distress, breathing rate appears normal, no obvious gross SOB, gasping or wheezing  CV: no obvious cyanosis  SKIN:  Erythematous lesion - right ankle.  No increased erythema extending up leg.    PSYCH/NEURO: pleasant and cooperative, no obvious depression or anxiety, speech and thought processing grossly intact  ASSESSMENT AND PLAN:  Discussed the following assessment and plan:  Fire ant bite, accidental or unintentional, initial encounter - Continue antihistamine.  Steroid cream as directed.  Follow.    Essential hypertension - Plan: Basic metabolic panel  Hyperlipidemia, unspecified hyperlipidemia type - Plan: Hepatic function panel, Lipid panel  Hypothyroidism, unspecified type - Plan: TSH  Sleeping difficulty  Essential hypertension Blood pressure as outlined.  Follow pressure.  Follow metabolic panel.   Hyperlipidemia Low cholesterol diet and exercise.  On crestor.  Follow lipid panel and liver function tests.    Hypothyroidism On thyroid replacement.  Follow tsh.    Sleeping difficulty Using lunesta.  Follow.      I discussed the assessment and treatment plan with the  patient. The patient was provided an opportunity to ask questions and all were answered. The patient agreed with the plan and demonstrated an understanding of the instructions.   The patient was advised to call back or seek an in-person evaluation if the symptoms worsen or if the condition fails to improve as anticipated.    Einar Pheasant, MD

## 2019-01-06 ENCOUNTER — Encounter: Payer: Self-pay | Admitting: Internal Medicine

## 2019-01-06 NOTE — Assessment & Plan Note (Signed)
Using lunesta.  Follow.

## 2019-01-06 NOTE — Assessment & Plan Note (Signed)
Low cholesterol diet and exercise.  On crestor.  Follow lipid panel and liver function tests.  

## 2019-01-06 NOTE — Assessment & Plan Note (Signed)
On thyroid replacement.  Follow tsh.  

## 2019-01-06 NOTE — Assessment & Plan Note (Signed)
Blood pressure as outlined.  Follow pressure.  Follow metabolic panel.

## 2019-02-04 IMAGING — US US BREAST*R* LIMITED INC AXILLA
1 series · 5 of 5 positions shown · non-contrast
Comparison: 08/11/2016, 07/21/2016, 07/14/2015, 06/20/2014,
05/31/2013, 06/08/2012

CLINICAL DATA: Six month follow-up of probably benign right breast
mass.

EXAM:
DIGITAL DIAGNOSTIC RIGHT MAMMOGRAM WITH CAD
ULTRASOUND RIGHT BREAST

[Series 1: us breast*right* limited inc axilla · 0.05mm/px · 5 of 5 slices shown]
[im 1/5]
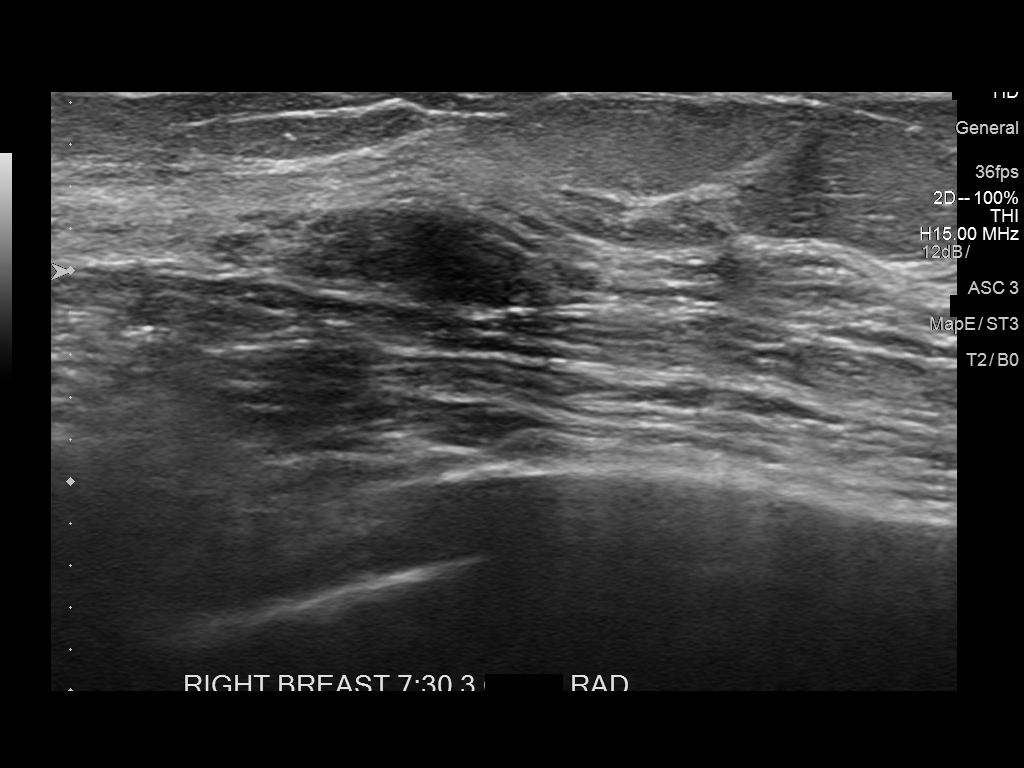
[im 2/5]
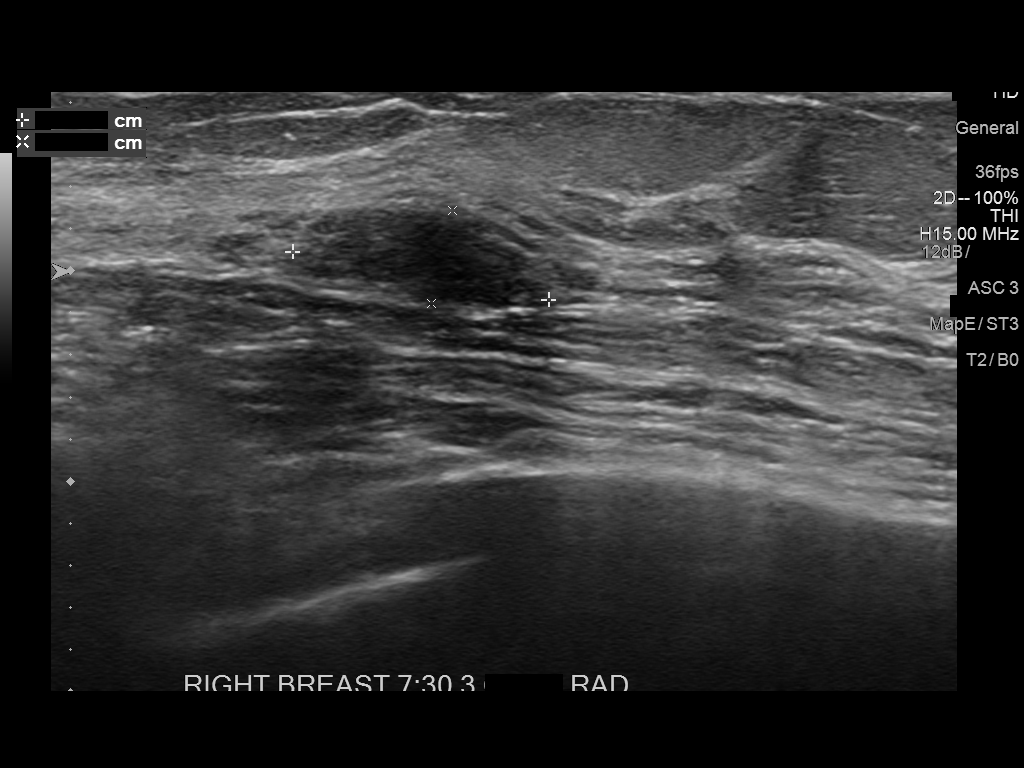
[im 3/5]
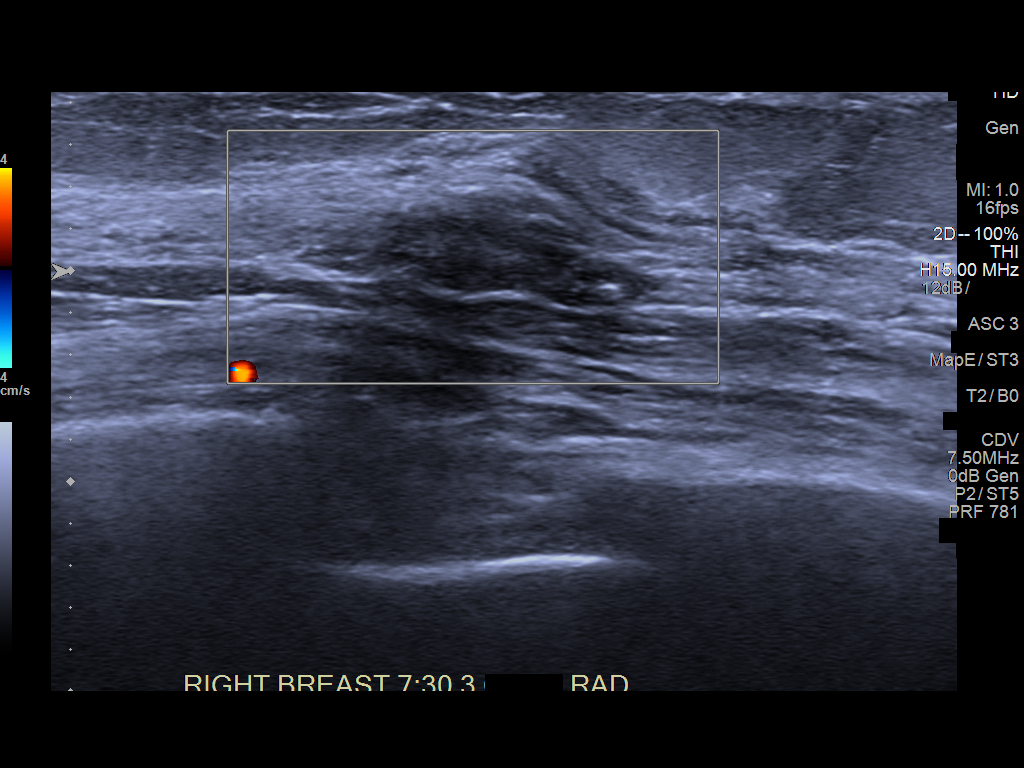
[im 4/5]
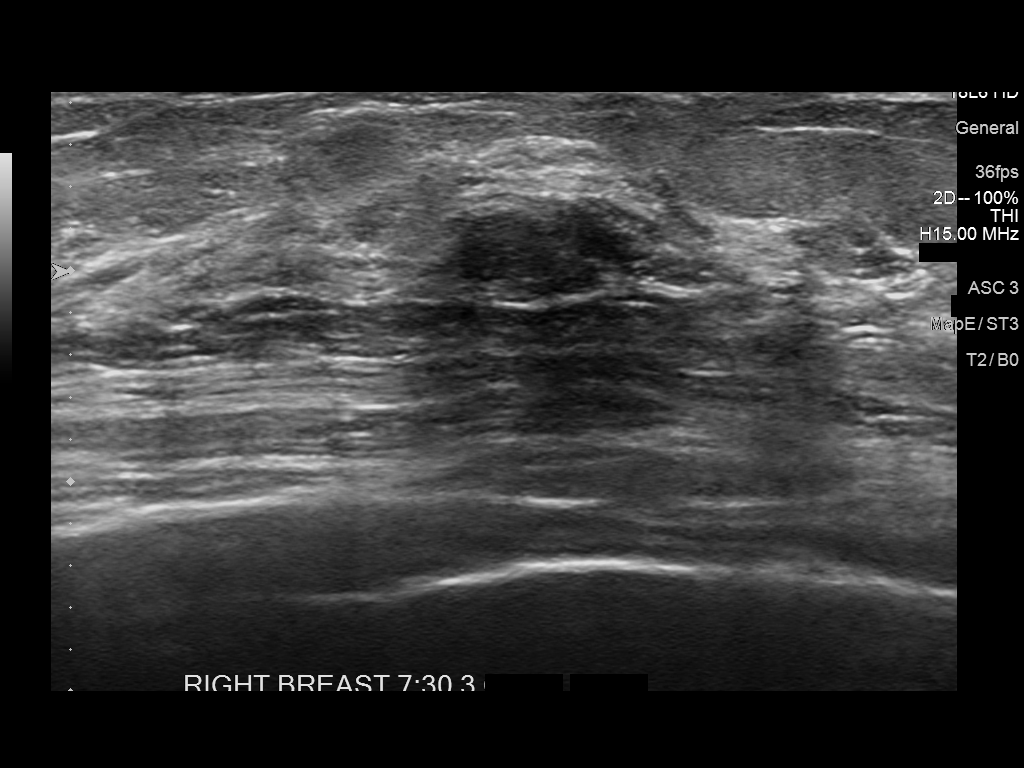
[im 5/5]
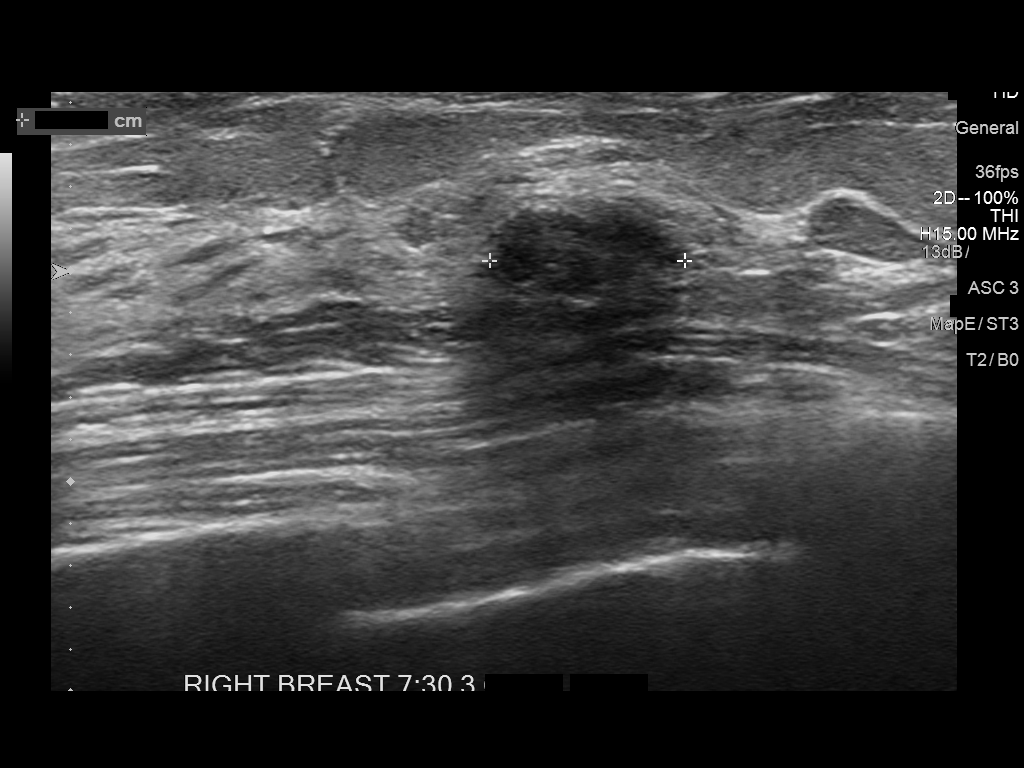

[5 of 5 positions shown; findings below may reference images not displayed]

ACR Breast Density Category c: The breast tissue is heterogeneously
dense, which may obscure small masses.
FINDINGS: The parenchymal pattern of the right breast is stable. A partially
circumscribed and partially obscured mass in the posterior third of
the breast parenchyma near the junction with the retroglandular fat
in the lower outer quadrant of the right breast is mammographically
stable. No new or suspicious mass, architectural distortion, or
suspicious microcalcification is identified.

Mammographic images were processed with CAD.

On physical exam, I do not definitely palpate a lump in the lower
outer quadrant of the right breast.

Targeted ultrasound is performed, showing an oval hypoechoic mass
oriented parallel to the chest wall overlying the pectoralis muscle
at [DATE] position 3 cm from the nipple measuring 1.2 x 0.5 x 0.9 cm.
There is no internal vascular flow. This mass appears similar to the
ultrasound of 08/11/2016 and is favored to be a benign fibroadenoma.
IMPRESSION: Stable probable fibroadenoma in the [DATE] position of the right
breast. No new or suspicious findings in the right breast.

RECOMMENDATION:
Bilateral diagnostic mammogram and right breast ultrasound is
recommended in July 2017.

I have discussed the findings and recommendations with the patient.
Results were also provided in writing at the conclusion of the
visit. If applicable, a reminder letter will be sent to the patient
regarding the next appointment.

BI-RADS CATEGORY  3: Probably benign.

## 2019-02-05 ENCOUNTER — Encounter: Payer: Self-pay | Admitting: Internal Medicine

## 2019-02-06 ENCOUNTER — Encounter: Payer: Self-pay | Admitting: Internal Medicine

## 2019-02-06 ENCOUNTER — Telehealth: Payer: Self-pay | Admitting: General Practice

## 2019-02-06 ENCOUNTER — Telehealth: Payer: Self-pay | Admitting: Internal Medicine

## 2019-02-06 ENCOUNTER — Ambulatory Visit (INDEPENDENT_AMBULATORY_CARE_PROVIDER_SITE_OTHER): Payer: PPO | Admitting: Internal Medicine

## 2019-02-06 ENCOUNTER — Other Ambulatory Visit: Payer: Self-pay

## 2019-02-06 ENCOUNTER — Other Ambulatory Visit: Payer: PPO

## 2019-02-06 DIAGNOSIS — R05 Cough: Secondary | ICD-10-CM | POA: Insufficient documentation

## 2019-02-06 DIAGNOSIS — R6889 Other general symptoms and signs: Secondary | ICD-10-CM | POA: Diagnosis not present

## 2019-02-06 DIAGNOSIS — Z20822 Contact with and (suspected) exposure to covid-19: Secondary | ICD-10-CM

## 2019-02-06 DIAGNOSIS — R059 Cough, unspecified: Secondary | ICD-10-CM | POA: Insufficient documentation

## 2019-02-06 NOTE — Progress Notes (Signed)
Patient ID: [name] [name], female   DOB: 05/24/1951, 68 y.o.   MRN: [id number]   Virtual Visit via video Note  This visit type was conducted due to national recommendations for restrictions regarding the COVID-19 pandemic (e.g. social distancing).  This format is felt to be most appropriate for this patient at this time.  All issues noted in this document were discussed and addressed.  No physical exam was performed (except for noted visual exam findings with Video Visits).   I connected with [name] [name] by a video enabled telemedicine application or telephone and verified that I am speaking with the correct person using two identifiers. Location patient: home Location provider: work Persons participating in the virtual visit: patient, provider  I discussed the limitations, risks, security and privacy concerns of performing an evaluation and management service by video and the availability of in person appointments.  The patient expressed understanding and agreed to proceed.   Reason for visit: acute visit  HPI: She reports that symptoms started over the last few days.  Increased sinus pressure, congestion and drainage.  Also having a cough.  No chest pain.  No chest tightness.  No documented fever.  Taking tylenol for headache.  Eating.  No vomiting.  Some aching.  Using astelin, flonase and taking an antihistamine.  Did have family over this weekend.  No known covid exposure.  Son is still working and was over at her house.  Has been trying to stay in as much as possible.     ROS: See pertinent positives and negatives per HPI.  Past Medical History:  Diagnosis Date  . Adenomatous polyp 2005  . Arrhythmia    H/O  . Basal cell carcinoma   . CTS (carpal tunnel syndrome)   . Environmental allergies   . History of chicken pox   . HTN (hypertension)   . Hypercholesterolemia   . Hypothyroidism     Past Surgical History:  Procedure Laterality Date  . BASAL CELL CARCINOMA EXCISION      right hip,abd, right arm, back neck  . BREAST BIOPSY    . BREAST CYST ASPIRATION Right    negative  . CTS releast Rt wrist    . lipoma removal     Rt shoulder   . MELANOMA EXCISION     right buttocks, top of scalp  . POLYPECTOMY     + TA  . TUBAL LIGATION  1982    Family History  Problem Relation Age of Onset  . Cervical cancer Mother   . Arthritis Mother   . Hypertension Mother   . Sudden death Mother        cerbral hemorrage  . Heart disease Father   . Cancer Father        lung cancer  . Diabetes Maternal Grandmother   . Breast cancer Paternal Aunt 74  . Colon cancer Neg Hx     SOCIAL HX: reviewed.    Current Outpatient Medications:  .  ammonium lactate (GERI-HYDROLAC 5) 5 % LOTN lotion, Apply 1 application topically 2 (two) times daily., Disp: , Rfl:  .  Azelastine HCl 137 MCG/SPRAY SOLN, USE ONE SPRAY INTO EACH NOSTRIL TWICE A DAY, Disp: 30 mL, Rfl: 2 .  cetirizine (ZYRTEC) 10 MG tablet, Take 10 mg by mouth daily., Disp: , Rfl:  .  cholecalciferol (VITAMIN D) 1000 units tablet, Take 1,000 Units by mouth daily., Disp: , Rfl:  .  clobetasol (TEMOVATE) 0.05 % external solution, Apply 1 application topically  daily., Disp: , Rfl:  .  Eszopiclone 3 MG TABS, TAKE 1 TABLET BY MOUTH IMMEDIATLEY BEFORE BEDTIME, Disp: 30 tablet, Rfl: 1 .  famotidine (PEPCID) 20 MG tablet, Take 20 mg by mouth daily., Disp: , Rfl:  .  fluticasone (FLONASE) 50 MCG/ACT nasal spray, USE 2 SPRAYS IN EACH NOSTRIL EVERY DAY, Disp: 16 g, Rfl: 1 .  hydrocortisone 2.5 % ointment, , Disp: , Rfl:  .  irbesartan (AVAPRO) 75 MG tablet, TAKE 1 TABLET BY MOUTH DAILY, Disp: 90 tablet, Rfl: 1 .  Multiple Vitamin (OMNICAP) TABS, Take by mouth., Disp: , Rfl:  .  Multiple Vitamins-Minerals (HAIR SKIN AND NAILS FORMULA PO), Take 1 tablet by mouth daily., Disp: , Rfl:  .  mupirocin ointment (BACTROBAN) 2 %, Apply to affected area bid, Disp: 22 g, Rfl: 0 .  Nutritional Supplements (NUTRITIONAL SUPPLEMENT PO), Take by  mouth. DoTERRA: ALPHA CRS+, xEO MEGA, MICROPLEX MVp, ON GUARD (protective blend), Disp: , Rfl:  .  Probiotic Product (PROBIOTIC-10 PO), Take by mouth., Disp: , Rfl:  .  rosuvastatin (CRESTOR) 5 MG tablet, Take 1 tablet (5 mg total) by mouth daily., Disp: 90 tablet, Rfl: 1 .  SYNTHROID 175 MCG tablet, Take 1 tablet (175 mcg total) by mouth daily before breakfast., Disp: 90 tablet, Rfl: 1 .  UNABLE TO FIND, Take by mouth 3 (three) times daily. Collegen supplements, Disp: , Rfl:   EXAM:  GENERAL: alert, oriented, appears well and in no acute distress  HEENT: atraumatic, conjunttiva clear, no obvious abnormalities on inspection of external nose and ears  NECK: normal movements of the head and neck  LUNGS: on inspection no signs of respiratory distress, breathing rate appears normal, no obvious gross SOB, gasping or wheezing  CV: no obvious cyanosis  PSYCH/NEURO: pleasant and cooperative, no obvious depression or anxiety, speech and thought processing grossly intact  ASSESSMENT AND PLAN:  Discussed the following assessment and plan:  Cough With increased sinus pressure, congestion and cough.  Concern over possible sinus infection.  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  Pt in agreement.  Continue astelin and flonase.  Stop the antihistamine.  Start mucinex as directed.  Can continue delsym.  Follow closely.      I discussed the assessment and treatment plan with the patient. The patient was provided an opportunity to ask questions and all were answered. The patient agreed with the plan and demonstrated an understanding of the instructions.   The patient was advised to call back or seek an in-person evaluation if the symptoms worsen or if the condition fails to improve as anticipated.    Einar Pheasant, MD

## 2019-02-06 NOTE — Telephone Encounter (Signed)
Message sent for update

## 2019-02-06 NOTE — Telephone Encounter (Signed)
Pt scheduled  

## 2019-02-06 NOTE — Addendum Note (Signed)
Addended by: Denman George on: 02/06/2019 10:30 AM   Modules accepted: Orders

## 2019-02-06 NOTE — Telephone Encounter (Signed)
Pt has been scheduled for Covid testing.  Scheduled with pt directly. Pt has been referred by: Einar Pheasant, MD

## 2019-02-06 NOTE — Assessment & Plan Note (Signed)
With increased sinus pressure, congestion and cough.  Concern over possible sinus infection.  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  Pt in agreement.  Continue astelin and flonase.  Stop the antihistamine.  Start mucinex as directed.  Can continue delsym.  Follow closely.

## 2019-02-09 NOTE — Telephone Encounter (Signed)
Addressed.  See other message

## 2019-02-11 ENCOUNTER — Telehealth: Payer: Self-pay | Admitting: Internal Medicine

## 2019-02-11 LAB — NOVEL CORONAVIRUS, NAA: SARS-CoV-2, NAA: NOT DETECTED

## 2019-02-11 NOTE — Telephone Encounter (Signed)
Message sent to Ms Leeman to notify covid test was negative.

## 2019-02-21 ENCOUNTER — Other Ambulatory Visit: Payer: Self-pay

## 2019-02-21 ENCOUNTER — Ambulatory Visit: Payer: PPO | Admitting: Internal Medicine

## 2019-02-21 ENCOUNTER — Ambulatory Visit (INDEPENDENT_AMBULATORY_CARE_PROVIDER_SITE_OTHER): Payer: PPO

## 2019-02-21 ENCOUNTER — Encounter: Payer: Self-pay | Admitting: Internal Medicine

## 2019-02-21 DIAGNOSIS — Z0000 Encounter for general adult medical examination without abnormal findings: Secondary | ICD-10-CM

## 2019-02-21 NOTE — Telephone Encounter (Signed)
See me before calling this pt.  I have placed an order for type and screen - which will give her blood type.  It appears this is not drawn here. Can draw at the hospital.  [facility] labs drawn.  Let me know if she desires to do this.  Also, I think we will have to change the date of her lab. Had covid test - 02/06/19.

## 2019-02-21 NOTE — Patient Instructions (Addendum)
  Ms. Bordeau , Thank you for taking time to come for your Medicare Wellness Visit. I appreciate your ongoing commitment to your health goals. Please review the following plan we discussed and let me know if I can assist you in the future.   These are the goals we discussed: Goals      Patient Stated   . DIET - INCREASE LEAN PROTEINS (pt-stated)     Low carb intake Lose weight Reduce sugar intake       This is a list of the screening recommended for you and due dates:  Health Maintenance  Topic Date Due  . Flu Shot  03/16/2019  . Mammogram  09/01/2019  . Tetanus Vaccine  05/19/2021  . Colon Cancer Screening  07/27/2023  . DEXA scan (bone density measurement)  Completed  .  Hepatitis C: One time screening is recommended by Center for Disease Control  (CDC) for  adults born from [birth year] through 1965.   Completed  . Pneumonia vaccines  Completed

## 2019-02-21 NOTE — Progress Notes (Signed)
Subjective:   Shaivi Rothschild is a 68 y.o. female who presents for Medicare Annual (Subsequent) preventive examination.  Review of Systems:  No ROS.  Medicare Wellness Virtual Visit.  Visual/audio telehealth visit, UTA vital signs.   See social history for additional risk factors.   Cardiac Risk Factors include: advanced age (>39men, >45 women);hypertension     Objective:     Vitals: There were no vitals taken for this visit.  There is no height or weight on file to calculate BMI.  Advanced Directives 02/21/2019 02/19/2018 02/17/2017  Does Patient Have a Medical Advance Directive? Yes Yes Yes  Type of Paramedic of [place];Living will Living will;Healthcare Power of [place];Living will  Does patient want to make changes to medical advance directive? No - Patient declined No - Patient declined No - Patient declined  Copy of [place] in Chart? Yes - validated most recent copy scanned in chart (See row information) Yes No - copy requested    Tobacco Social History   Tobacco Use  Smoking Status Former Smoker   Types: Cigarettes   Quit date: 07/16/1980   Years since quitting: 38.6  Smokeless Tobacco Never Used     Counseling given: Not Answered   Clinical Intake:  Pre-visit preparation completed: Yes        Diabetes: No  How often do you need to have someone help you when you read instructions, pamphlets, or other written materials from your doctor or pharmacy?: 1 - Never  Interpreter Needed?: No     Past Medical History:  Diagnosis Date   Adenomatous polyp 2005   Arrhythmia    H/O   Basal cell carcinoma    CTS (carpal tunnel syndrome)    Environmental allergies    History of chicken pox    HTN (hypertension)    Hypercholesterolemia    Hypothyroidism    Past Surgical History:  Procedure Laterality Date   BASAL CELL CARCINOMA EXCISION     right hip,abd, right arm, back  neck   BREAST BIOPSY     BREAST CYST ASPIRATION Right    negative   CTS releast Rt wrist     lipoma removal     Rt shoulder    MELANOMA EXCISION     right buttocks, top of scalp   POLYPECTOMY     + TA   TUBAL LIGATION  1982   Family History  Problem Relation Age of Onset   Cervical cancer Mother    Arthritis Mother    Hypertension Mother    Sudden death Mother        cerbral hemorrage   Heart disease Father    Cancer Father        lung cancer   Diabetes Maternal Grandmother    Breast cancer Paternal Aunt 74   Colon cancer Neg Hx    Social History   Socioeconomic History   Marital status: Widowed    Spouse name: Not on file   Number of children: 1   Years of education: Not on file   Highest education level: Not on file  Occupational History   Occupation: [employer]/ [employer]     Employer: [employer]  Social Needs   Financial resource strain: Not hard at all   Food insecurity    Worry: Never true    Inability: Never true   Transportation needs    Medical: No    Non-medical: No  Tobacco Use  Smoking status: Former Smoker    Types: Cigarettes    Quit date: 07/16/1980    Years since quitting: 38.6   Smokeless tobacco: Never Used  Substance and Sexual Activity   Alcohol use: Yes    Alcohol/week: 7.0 standard drinks    Types: 7 Glasses of wine per week    Comment: 1 per day    Drug use: No   Sexual activity: Never  Lifestyle   Physical activity    Days per week: 7 days    Minutes per session: 60 min   Stress: Not at all  Relationships   Social connections    Talks on phone: Not on file    Gets together: Not on file    Attends religious service: Not on file    Active member of club or organization: Not on file    Attends meetings of clubs or organizations: Not on file    Relationship status: Not on file  Other Topics Concern   Not on file  Social History Narrative   Widowed; 1 son.    Retired Art therapist    Daily caffeine use 2-3/day       Cell # [id number]    Outpatient Encounter Medications as of 02/21/2019  Medication Sig   ammonium lactate (GERI-HYDROLAC 5) 5 % LOTN lotion Apply 1 application topically 2 (two) times daily.   Azelastine HCl 137 MCG/SPRAY SOLN USE ONE SPRAY INTO EACH NOSTRIL TWICE A DAY   cetirizine (ZYRTEC) 10 MG tablet Take 10 mg by mouth daily.   cholecalciferol (VITAMIN D) 1000 units tablet Take 1,000 Units by mouth daily.   clobetasol (TEMOVATE) 0.05 % external solution Apply 1 application topically daily.   Eszopiclone 3 MG TABS TAKE 1 TABLET BY MOUTH IMMEDIATLEY BEFORE BEDTIME   famotidine (PEPCID) 20 MG tablet Take 20 mg by mouth daily.   fluticasone (FLONASE) 50 MCG/ACT nasal spray USE 2 SPRAYS IN EACH NOSTRIL EVERY DAY   hydrocortisone 2.5 % ointment    irbesartan (AVAPRO) 75 MG tablet TAKE 1 TABLET BY MOUTH DAILY   Multiple Vitamin (OMNICAP) TABS Take by mouth.   Multiple Vitamins-Minerals (HAIR SKIN AND NAILS FORMULA PO) Take 1 tablet by mouth daily.   mupirocin ointment (BACTROBAN) 2 % Apply to affected area bid   Nutritional Supplements (NUTRITIONAL SUPPLEMENT PO) Take by mouth. DoTERRA: ALPHA CRS+, xEO MEGA, MICROPLEX MVp, ON GUARD (protective blend)   Probiotic Product (PROBIOTIC-10 PO) Take by mouth.   rosuvastatin (CRESTOR) 5 MG tablet Take 1 tablet (5 mg total) by mouth daily.   SYNTHROID 175 MCG tablet Take 1 tablet (175 mcg total) by mouth daily before breakfast.   UNABLE TO FIND Take by mouth 3 (three) times daily. Collegen supplements   No facility-administered encounter medications on file as of 02/21/2019.     Activities of Daily Living In your present state of health, do you have any difficulty performing the following activities: 02/21/2019  Hearing? N  Vision? N  Difficulty concentrating or making decisions? N  Walking or climbing stairs? N  Dressing or bathing? N  Doing errands, shopping? N  Preparing Food and eating ? N    Using the Toilet? N  In the past six months, have you accidently leaked urine? N  Do you have problems with loss of bowel control? N  Managing your Medications? N  Managing your Finances? N  Housekeeping or managing your Housekeeping? N  Some recent data might be hidden    Patient Care Team: [place],  Randell Patient, MD as PCP - General (Internal Medicine)    Assessment:   This is a routine wellness examination for [name].  I connected with patient 02/21/19 at 10:00 AM EDT by a video/audio enabled telemedicine application and verified that I am speaking with the correct person using two identifiers. Patient stated full name and DOB. Patient gave permission to continue with virtual visit. Patient's location was at home and Nurse's location was at [place] office.   Health Screenings  Mammogram - 08/2018 Colonoscopy - 07/2013 Bone Density - 06/2015 Glaucoma -none Hearing -demonstrates normal hearing during visit. TSH- 07/2018 Cholesterol - 07/2018 Dental- visits every 6 months Vision- visits within the last 12 months.  Social  Alcohol intake - yes      Smoking history-  former  Smokers in home? none Illicit drug use? none Exercise - walking daily  Diet - healthy Sexually Active -never BMI- discussed the importance of a healthy diet, water intake and the benefits of aerobic exercise.  Educational material provided.   Safety  Patient feels safe at home- yes Patient does have smoke detectors at home- yes Patient does wear sunscreen or protective clothing when in direct sunlight -yes Patient does wear seat belt when in a moving vehicle -yes Patient drives- yes  MPNTI-14 precautions and sickness symptoms discussed.   Activities of Daily Living Patient denies needing assistance with: driving, household chores, feeding themselves, getting from bed to chair, getting to the toilet, bathing/showering, dressing, managing money, or preparing meals.  No new identified risk were noted.     Depression Screen Patient denies losing interest in daily life, feeling hopeless, or crying easily over simple problems.   Medication-taking as directed and without issues.   Fall Screen Patient denies being afraid of falling or falling in the last year.   Memory Screen Patient is alert.  Patient denies difficulty focusing, concentrating or misplacing items. Correctly identified the president of the [place], season and recall. Patient likes to read for brain stimulation.  Immunizations The following Immunizations were discussed: Influenza, shingles, pneumonia, and tetanus.   Other Providers Patient Care Team: Einar Pheasant, MD as PCP - General (Internal Medicine)  Exercise Activities and Dietary recommendations Current Exercise Habits: Home exercise routine, Type of exercise: walking, Time (Minutes): 60, Frequency (Times/Week): 7, Weekly Exercise (Minutes/Week): 420  Goals      Patient Stated    DIET - INCREASE LEAN PROTEINS (pt-stated)     Low carb intake Lose weight Reduce sugar intake       Fall Risk Fall Risk  02/21/2019 02/19/2018 02/17/2017 07/22/2016 06/08/2016  Falls in the past year? 0 No No No No   Depression Screen PHQ 2/9 Scores 02/21/2019 02/19/2018 08/31/2017 02/17/2017  PHQ - 2 Score 0 0 0 -  PHQ- 9 Score - - 1 -  Exception Documentation - - - Other- indicate reason in comment box  Not completed - - - Recent loss of spouse.  Currently in grief counseling.  Followed by PCP.     Cognitive Function MMSE - Mini Mental State Exam 02/19/2018 02/17/2017  Orientation to time 5 5  Orientation to Place 5 5  Registration 3 3  Attention/ Calculation 5 5  Recall 3 3  Language- name 2 objects 2 2  Language- repeat 1 1  Language- follow 3 step command 3 3  Language- read & follow direction 1 1  Write a sentence 1 1  Copy design 1 1  Total score 30 30     6CIT Screen  02/21/2019  What Year? 0 points  What month? 0 points  What time? 0 points  Count back from 20 0  points  Months in reverse 0 points  Repeat phrase 0 points  Total Score 0    Immunization History  Administered Date(s) Administered   Influenza Split 05/06/2014   Influenza, High Dose Seasonal PF 04/14/2016, 05/25/2018   Influenza,inj,Quad PF,6+ Mos 05/13/2015   Pneumococcal Conjugate-13 06/20/2014   Pneumococcal Polysaccharide-23 06/25/2015   Tdap 05/20/2011   Zoster 09/20/2013   Zoster Recombinat (Shingrix) 04/26/2018, 07/05/2018   Screening Tests Health Maintenance  Topic Date Due   INFLUENZA VACCINE  03/16/2019   MAMMOGRAM  09/01/2019   TETANUS/TDAP  05/19/2021   COLONOSCOPY  07/27/2023   DEXA SCAN  Completed   Hepatitis C Screening  Completed   PNA vac Low Risk Adult  Completed      Plan:    End of life planning; Advance aging; Advanced directives discussed.  Copy of current HCPOA/Living Will on file.    I have personally reviewed and noted the following in the patients chart:    Medical and social history  Use of alcohol, tobacco or illicit drugs   Current medications and supplements  Functional ability and status  Nutritional status  Physical activity  Advanced directives  List of other physicians  Hospitalizations, surgeries, and ER visits in previous 12 months  Vitals  Screenings to include cognitive, depression, and falls  Referrals and appointments  In addition, I have reviewed and discussed with patient certain preventive protocols, quality metrics, and best practice recommendations. A written personalized care plan for preventive services as well as general preventive health recommendations were provided to patient.     Varney Biles, LPN  04/23/3569   Reviewed above information.  Agree with assessment and plan.    Dr Nicki Reaper

## 2019-02-22 NOTE — Telephone Encounter (Signed)
Moved pts lab appt. She does not want the blood type test

## 2019-02-25 ENCOUNTER — Other Ambulatory Visit: Payer: PPO

## 2019-03-13 ENCOUNTER — Other Ambulatory Visit: Payer: Self-pay

## 2019-03-13 ENCOUNTER — Other Ambulatory Visit: Payer: Self-pay | Admitting: Internal Medicine

## 2019-03-13 ENCOUNTER — Other Ambulatory Visit (INDEPENDENT_AMBULATORY_CARE_PROVIDER_SITE_OTHER): Payer: PPO

## 2019-03-13 DIAGNOSIS — I10 Essential (primary) hypertension: Secondary | ICD-10-CM

## 2019-03-13 DIAGNOSIS — E039 Hypothyroidism, unspecified: Secondary | ICD-10-CM | POA: Diagnosis not present

## 2019-03-13 DIAGNOSIS — E785 Hyperlipidemia, unspecified: Secondary | ICD-10-CM

## 2019-03-13 LAB — BASIC METABOLIC PANEL
BUN: 25 mg/dL — ABNORMAL HIGH (ref 6–23)
CO2: 31 mEq/L (ref 19–32)
Calcium: 9.8 mg/dL (ref 8.4–10.5)
Chloride: 102 mEq/L (ref 96–112)
Creatinine, Ser: 0.73 mg/dL (ref 0.40–1.20)
GFR: 78.88 mL/min (ref >60.00)
Glucose, Bld: 92 mg/dL (ref 70–99)
Potassium: 5 mEq/L (ref 3.5–5.1)
Sodium: 140 mEq/L (ref 135–145)

## 2019-03-13 LAB — HEPATIC FUNCTION PANEL
ALT: 27 U/L (ref 0–35)
AST: 20 U/L (ref 0–37)
Albumin: 4.5 g/dL (ref 3.5–5.2)
Alkaline Phosphatase: 80 U/L (ref 39–117)
Bilirubin, Direct: 0.1 mg/dL (ref 0.0–0.3)
Total Bilirubin: 0.5 mg/dL (ref 0.2–1.2)
Total Protein: 6.3 g/dL (ref 6.0–8.3)

## 2019-03-13 LAB — LIPID PANEL
Cholesterol: 195 mg/dL (ref 0–200)
HDL: 69.7 mg/dL (ref >39.00)
LDL Cholesterol: 103 mg/dL — ABNORMAL HIGH (ref 0–99)
NonHDL: 125.61
Total CHOL/HDL Ratio: 3
Triglycerides: 112 mg/dL (ref 0.0–149.0)
VLDL: 22.4 mg/dL (ref 0.0–40.0)

## 2019-03-13 LAB — TSH: TSH: 3.96 u[IU]/mL (ref 0.35–4.50)

## 2019-03-13 NOTE — Telephone Encounter (Signed)
rx ok'd for lunesta #30 with one refill.   

## 2019-03-13 NOTE — Telephone Encounter (Signed)
Refilled: 11/07/2018 Last OV: 02/06/2019 Next OV: 04/25/2019

## 2019-03-14 ENCOUNTER — Other Ambulatory Visit: Payer: Self-pay | Admitting: Internal Medicine

## 2019-03-14 DIAGNOSIS — E875 Hyperkalemia: Secondary | ICD-10-CM

## 2019-03-14 NOTE — Progress Notes (Signed)
Order placed for f/u stat potassium.  

## 2019-03-21 ENCOUNTER — Other Ambulatory Visit: Payer: PPO

## 2019-03-22 ENCOUNTER — Other Ambulatory Visit: Payer: Self-pay

## 2019-03-22 ENCOUNTER — Other Ambulatory Visit (INDEPENDENT_AMBULATORY_CARE_PROVIDER_SITE_OTHER): Payer: PPO

## 2019-03-22 DIAGNOSIS — E875 Hyperkalemia: Secondary | ICD-10-CM | POA: Diagnosis not present

## 2019-03-22 LAB — POTASSIUM: Potassium: 3.9 mEq/L (ref 3.5–5.1)

## 2019-03-24 ENCOUNTER — Encounter: Payer: Self-pay | Admitting: Internal Medicine

## 2019-04-18 ENCOUNTER — Other Ambulatory Visit: Payer: Self-pay | Admitting: Internal Medicine

## 2019-04-25 ENCOUNTER — Ambulatory Visit: Payer: PPO | Admitting: Internal Medicine

## 2019-04-29 DIAGNOSIS — L578 Other skin changes due to chronic exposure to nonionizing radiation: Secondary | ICD-10-CM | POA: Diagnosis not present

## 2019-04-29 DIAGNOSIS — Z872 Personal history of diseases of the skin and subcutaneous tissue: Secondary | ICD-10-CM | POA: Diagnosis not present

## 2019-04-29 DIAGNOSIS — D485 Neoplasm of uncertain behavior of skin: Secondary | ICD-10-CM | POA: Diagnosis not present

## 2019-04-29 DIAGNOSIS — C4441 Basal cell carcinoma of skin of scalp and neck: Secondary | ICD-10-CM | POA: Diagnosis not present

## 2019-04-29 DIAGNOSIS — Z859 Personal history of malignant neoplasm, unspecified: Secondary | ICD-10-CM | POA: Diagnosis not present

## 2019-04-29 DIAGNOSIS — L638 Other alopecia areata: Secondary | ICD-10-CM | POA: Diagnosis not present

## 2019-04-29 DIAGNOSIS — L570 Actinic keratosis: Secondary | ICD-10-CM | POA: Diagnosis not present

## 2019-04-29 DIAGNOSIS — Z85828 Personal history of other malignant neoplasm of skin: Secondary | ICD-10-CM | POA: Diagnosis not present

## 2019-05-02 ENCOUNTER — Other Ambulatory Visit: Payer: Self-pay | Admitting: Internal Medicine

## 2019-05-07 NOTE — Telephone Encounter (Signed)
ok'd rx for lunesta #30 with 1 refill.

## 2019-05-23 ENCOUNTER — Other Ambulatory Visit: Payer: Self-pay

## 2019-05-27 ENCOUNTER — Ambulatory Visit: Payer: PPO | Admitting: Internal Medicine

## 2019-06-03 ENCOUNTER — Other Ambulatory Visit: Payer: Self-pay | Admitting: Internal Medicine

## 2019-06-05 ENCOUNTER — Encounter: Payer: Self-pay | Admitting: Family Medicine

## 2019-06-05 ENCOUNTER — Ambulatory Visit (INDEPENDENT_AMBULATORY_CARE_PROVIDER_SITE_OTHER): Payer: PPO | Admitting: Family Medicine

## 2019-06-05 ENCOUNTER — Other Ambulatory Visit: Payer: Self-pay

## 2019-06-05 ENCOUNTER — Telehealth: Payer: Self-pay | Admitting: Internal Medicine

## 2019-06-05 VITALS — BP 116/62 | HR 68 | Temp 98.1°F | Ht 63.0 in | Wt 157.6 lb

## 2019-06-05 DIAGNOSIS — R300 Dysuria: Secondary | ICD-10-CM

## 2019-06-05 DIAGNOSIS — N390 Urinary tract infection, site not specified: Secondary | ICD-10-CM | POA: Diagnosis not present

## 2019-06-05 DIAGNOSIS — R319 Hematuria, unspecified: Secondary | ICD-10-CM | POA: Diagnosis not present

## 2019-06-05 LAB — POCT URINALYSIS DIPSTICK
Bilirubin, UA: NEGATIVE
Glucose, UA: NEGATIVE
Ketones, UA: NEGATIVE
Nitrite, UA: POSITIVE
Protein, UA: POSITIVE — AB
Spec Grav, UA: 1.01 (ref 1.010–1.025)
Urobilinogen, UA: NEGATIVE E.U./dL — AB
pH, UA: 6.5 (ref 5.0–8.0)

## 2019-06-05 MED ORDER — SULFAMETHOXAZOLE-TRIMETHOPRIM 800-160 MG PO TABS: 1.0000 | ORAL_TABLET | Freq: Two times a day (BID) | ORAL | 0 refills | Status: DC

## 2019-06-05 NOTE — Progress Notes (Signed)
Subjective:    Patient ID: [name] [name], female    DOB: [birth year], 68 y.o.   MRN: [id number]  HPI  Patient presents to clinic due to possible UTI.  She has had burning, increased urinary frequency and pressure for about 3 days.  Suspect she has UTI.  Denies severe abdominal pain, vomiting or diarrhea.  Denies fever or chills.   Patient Active Problem List   Diagnosis Date Noted  . Cough 02/06/2019  . Hair thinning 07/25/2018  . Sebaceous cyst 06/26/2017  . Essential hypertension 11/09/2015  . Anxiety 08/23/2015  . Alopecia 03/04/2015  . Health care maintenance 10/26/2014  . Sleeping difficulty 10/26/2014  . Elevated blood pressure 06/20/2014  . Environmental allergies 11/03/2013  . Basal cell carcinoma 11/03/2013  . Vaginal dryness 11/03/2013  . Hypothyroidism 10/30/2013  . Heart palpitations 09/16/2013  . Hyperlipidemia 09/16/2013  . History of colonic polyps 03/23/2010   Social History   Tobacco Use  . Smoking status: Former Smoker    Types: Cigarettes    Quit date: 07/16/1980    Years since quitting: 38.9  . Smokeless tobacco: Never Used  Substance Use Topics  . Alcohol use: Yes    Alcohol/week: 7.0 standard drinks    Types: 7 Glasses of wine per week    Comment: 1 per day    Review of Systems  Constitutional: Negative for chills, fatigue and fever.  HENT: Negative for congestion, ear pain, sinus pain and sore throat.   Eyes: Negative.   Respiratory: Negative for cough, shortness of breath and wheezing.   Cardiovascular: Negative for chest pain, palpitations and leg swelling.  Gastrointestinal: Negative for abdominal pain, diarrhea, nausea and vomiting.  Genitourinary: +dysuria, frequency and urgency.  Musculoskeletal: Negative for arthralgias and myalgias.  Skin: Negative for color change, pallor and rash.  Neurological: Negative for syncope, light-headedness and headaches.  Psychiatric/Behavioral: The patient is not nervous/anxious.       Objective:    Physical Exam Vitals signs and nursing note reviewed.  Constitutional:      General: She is not in acute distress.    Appearance: She is well-developed. She is not toxic-appearing.  HENT:     Head: Normocephalic and atraumatic.  Eyes:     General: No scleral icterus. Neck:     Musculoskeletal: Neck supple.     Trachea: No tracheal deviation.  Cardiovascular:     Rate and Rhythm: Normal rate and regular rhythm.     Heart sounds: Normal heart sounds.  Pulmonary:     Effort: Pulmonary effort is normal. No respiratory distress.     Breath sounds: Normal breath sounds.  Abdominal:     General: Bowel sounds are normal. There is no distension.     Palpations: Abdomen is soft. There is no mass.     Tenderness: There is abdominal tenderness (mild suprapubic with palpation). There is no right CVA tenderness, left CVA tenderness, guarding or rebound.  Musculoskeletal:     Right lower leg: No edema.     Left lower leg: No edema.  Skin:    General: Skin is warm and dry.     Coloration: Skin is not jaundiced or pale.  Neurological:     Mental Status: She is alert and oriented to person, place, and time.     Gait: Gait normal.  Psychiatric:        Mood and Affect: Mood normal.        Behavior: Behavior normal.     Today's Vitals  06/05/19 1044  BP: 116/62  Pulse: 68  Temp: 98.1 F (36.7 C)  SpO2: 99%  Weight: 157 lb 9.6 oz (71.5 kg)  Height: 5\' 3"  (1.6 m)   Body mass index is 27.92 kg/m.     Assessment & Plan:    Urinary tract infection - patient's urinalysis is positive for both leukocytes and nitrites as well as small amount of blood.  We will treat with Bactrim twice daily for 5 days.  Urine culture also sent to lab to be sure we are on proper treatment plan.  Increase water intake, avoid excess sugar and caffeinated beverages, wear cotton underwear and always wipe her back after using restroom.  Patient will keep already planned follow-ups with PCP as scheduled and  call office anytime with questions or concerns.

## 2019-06-05 NOTE — Telephone Encounter (Signed)
Patient was scheduled an In-person office visit with Philis Nettle, FNP today @ 10:40 am.  COVID screening completed-no symptoms and no exposure.

## 2019-06-05 NOTE — Patient Instructions (Signed)
Urinary Tract Infection, Adult A urinary tract infection (UTI) is an infection of any part of the urinary tract. The urinary tract includes:  The kidneys.  The ureters.  The bladder.  The urethra. These organs make, store, and get rid of pee (urine) in the body. What are the causes? This is caused by germs (bacteria) in your genital area. These germs grow and cause swelling (inflammation) of your urinary tract. What increases the risk? You are more likely to develop this condition if:  You have a small, thin tube (catheter) to drain pee.  You cannot control when you pee or poop (incontinence).  You are female, and: ? You use these methods to prevent pregnancy: ? A medicine that kills sperm (spermicide). ? A device that blocks sperm (diaphragm). ? You have low levels of a female hormone (estrogen). ? You are pregnant.  You have genes that add to your risk.  You are sexually active.  You take antibiotic medicines.  You have trouble peeing because of: ? A prostate that is bigger than normal, if you are female. ? A blockage in the part of your body that drains pee from the bladder (urethra). ? A kidney stone. ? A nerve condition that affects your bladder (neurogenic bladder). ? Not getting enough to drink. ? Not peeing often enough.  You have other conditions, such as: ? Diabetes. ? A weak disease-fighting system (immune system). ? Sickle cell disease. ? Gout. ? Injury of the spine. What are the signs or symptoms? Symptoms of this condition include:  Needing to pee right away (urgently).  Peeing often.  Peeing small amounts often.  Pain or burning when peeing.  Blood in the pee.  Pee that smells bad or not like normal.  Trouble peeing.  Pee that is cloudy.  Fluid coming from the vagina, if you are female.  Pain in the belly or lower back. Other symptoms include:  Throwing up (vomiting).  No urge to eat.  Feeling mixed up (confused).  Being tired  and grouchy (irritable).  A fever.  Watery poop (diarrhea). How is this treated? This condition may be treated with:  Antibiotic medicine.  Other medicines.  Drinking enough water. Follow these instructions at home:  Medicines  Take over-the-counter and prescription medicines only as told by your doctor.  If you were prescribed an antibiotic medicine, take it as told by your doctor. Do not stop taking it even if you start to feel better. General instructions  Make sure you: ? Pee until your bladder is empty. ? Do not hold pee for a long time. ? Empty your bladder after sex. ? Wipe from front to back after pooping if you are a female. Use each tissue one time when you wipe.  Drink enough fluid to keep your pee pale yellow.  Keep all follow-up visits as told by your doctor. This is important. Contact a doctor if:  You do not get better after 1-2 days.  Your symptoms go away and then come back. Get help right away if:  You have very bad back pain.  You have very bad pain in your lower belly.  You have a fever.  You are sick to your stomach (nauseous).  You are throwing up. Summary  A urinary tract infection (UTI) is an infection of any part of the urinary tract.  This condition is caused by germs in your genital area.  There are many risk factors for a UTI. These include having a small, thin   tube to drain pee and not being able to control when you pee or poop.  Treatment includes antibiotic medicines for germs.  Drink enough fluid to keep your pee pale yellow. This information is not intended to replace advice given to you by your health care provider. Make sure you discuss any questions you have with your health care provider. Document Released: 01/18/2008 Document Revised: 07/19/2018 Document Reviewed: 02/08/2018 Elsevier Patient Education  2020 [employer]  

## 2019-06-05 NOTE — Telephone Encounter (Signed)
Patient called and said that she has a UTI and would like to know if someone can work her in. She been with this for 3 days. Please call patient back, thanks.

## 2019-06-07 LAB — URINE CULTURE
MICRO NUMBER:: 1013987
SPECIMEN QUALITY:: ADEQUATE

## 2019-06-10 DIAGNOSIS — C4441 Basal cell carcinoma of skin of scalp and neck: Secondary | ICD-10-CM | POA: Diagnosis not present

## 2019-06-10 DIAGNOSIS — C4491 Basal cell carcinoma of skin, unspecified: Secondary | ICD-10-CM | POA: Diagnosis not present

## 2019-06-11 ENCOUNTER — Other Ambulatory Visit: Payer: Self-pay

## 2019-06-11 ENCOUNTER — Ambulatory Visit (INDEPENDENT_AMBULATORY_CARE_PROVIDER_SITE_OTHER): Payer: PPO | Admitting: Internal Medicine

## 2019-06-11 DIAGNOSIS — F419 Anxiety disorder, unspecified: Secondary | ICD-10-CM

## 2019-06-11 DIAGNOSIS — Z9109 Other allergy status, other than to drugs and biological substances: Secondary | ICD-10-CM | POA: Diagnosis not present

## 2019-06-11 DIAGNOSIS — I10 Essential (primary) hypertension: Secondary | ICD-10-CM | POA: Diagnosis not present

## 2019-06-11 DIAGNOSIS — E785 Hyperlipidemia, unspecified: Secondary | ICD-10-CM

## 2019-06-11 DIAGNOSIS — L659 Nonscarring hair loss, unspecified: Secondary | ICD-10-CM

## 2019-06-11 DIAGNOSIS — E039 Hypothyroidism, unspecified: Secondary | ICD-10-CM | POA: Diagnosis not present

## 2019-06-11 NOTE — Progress Notes (Signed)
Patient ID: [name] [name], female   DOB: 1951/06/09, 68 y.o.   MRN: [id number]   Subjective:    Patient ID: [name] [name], female    DOB: [birth year], 68 y.o.   MRN: [id number]  HPI  Patient here for a scheduled follow up.  She reports she is doing relatively well.  Handling stress.  Does not feel needs anything more at this time.  Has good support.  Excited - grandchild due soon.  Stays active.  Plans to exercise more.  No chest pain.  No sob.  No acid reflux.  No abdominal pain.  Bowels moving.  Blood pressure doing well.  Sleeping with 1/2 lunesta.  Does need.  Recently evaluated and diagnosed with uti.  Treated with bactrim.  No symptoms now.     Past Medical History:  Diagnosis Date   Adenomatous polyp 2005   Arrhythmia    H/O   Basal cell carcinoma    CTS (carpal tunnel syndrome)    Environmental allergies    History of chicken pox    HTN (hypertension)    Hypercholesterolemia    Hypothyroidism    Past Surgical History:  Procedure Laterality Date   BASAL CELL CARCINOMA EXCISION     right hip,abd, right arm, back neck   BREAST BIOPSY     BREAST CYST ASPIRATION Right    negative   CTS releast Rt wrist     lipoma removal     Rt shoulder    MELANOMA EXCISION     right buttocks, top of scalp   POLYPECTOMY     + TA   TUBAL LIGATION  1982   Family History  Problem Relation Age of Onset   Cervical cancer Mother    Arthritis Mother    Hypertension Mother    Sudden death Mother        cerbral hemorrage   Heart disease Father    Cancer Father        lung cancer   Diabetes Maternal Grandmother    Breast cancer Paternal Aunt 35   Colon cancer Neg Hx    Social History   Socioeconomic History   Marital status: Widowed    Spouse name: Not on file   Number of children: 1   Years of education: Not on file   Highest education level: Not on file  Occupational History   Occupation: ScheduliOfficeng/ Ortho     Employer: [employer]    Social Needs   Financial resource strain: Not hard at all   Food insecurity    Worry: Never true    Inability: Never true   Transportation needs    Medical: No    Non-medical: No  Tobacco Use   Smoking status: Former Smoker    Types: Cigarettes    Quit date: 07/16/1980    Years since quitting: 38.9   Smokeless tobacco: Never Used  Substance and Sexual Activity   Alcohol use: Yes    Alcohol/week: 7.0 standard drinks    Types: 7 Glasses of wine per week    Comment: 1 per day    Drug use: No   Sexual activity: Never  Lifestyle   Physical activity    Days per week: 7 days    Minutes per session: 60 min   Stress: Not at all  Relationships   Social connections    Talks on phone: Not on file    Gets together: Not on file    Attends religious service: Not  on file    Active member of club or organization: Not on file    Attends meetings of clubs or organizations: Not on file    Relationship status: Not on file  Other Topics Concern   Not on file  Social History Narrative   Widowed; 1 son.    Retired Art therapist   Daily caffeine use 2-3/day       Cell # [id number]    Outpatient Encounter Medications as of 06/11/2019  Medication Sig   ammonium lactate (GERI-HYDROLAC 5) 5 % LOTN lotion Apply 1 application topically 2 (two) times daily.   azelastine (ASTELIN) 0.1 % nasal spray USE ONE SPRAY INTO EACH NOSTRIL TWICE A DAY   cetirizine (ZYRTEC) 10 MG tablet Take 10 mg by mouth daily.   cholecalciferol (VITAMIN D) 1000 units tablet Take 1,000 Units by mouth daily.   clobetasol (TEMOVATE) 0.05 % external solution Apply 1 application topically daily.   Eszopiclone 3 MG TABS TAKE ONE TABLET BY MOUTH IMMEDIATELY BEFORE BEDTIME   famotidine (PEPCID) 20 MG tablet Take 20 mg by mouth daily.   fluticasone (FLONASE) 50 MCG/ACT nasal spray USE 2 SPRAYS IN EACH NOSTRIL EVERY DAY   hydrocortisone 2.5 % ointment    irbesartan (AVAPRO) 75 MG tablet TAKE 1 TABLET BY  MOUTH DAILY   Multiple Vitamins-Minerals (HAIR SKIN AND NAILS FORMULA PO) Take 1 tablet by mouth daily.   mupirocin ointment (BACTROBAN) 2 % Apply to affected area bid   Nutritional Supplements (NUTRITIONAL SUPPLEMENT PO) Take by mouth. DoTERRA: ALPHA CRS+, xEO MEGA, MICROPLEX MVp, ON GUARD (protective blend)   Probiotic Product (PROBIOTIC-10 PO) Take by mouth.   rosuvastatin (CRESTOR) 5 MG tablet TAKE ONE TABLET EVERY DAY   sulfamethoxazole-trimethoprim (BACTRIM DS) 800-160 MG tablet Take 1 tablet by mouth 2 (two) times daily.   SYNTHROID 175 MCG tablet Take 1 tablet (175 mcg total) by mouth daily before breakfast.   UNABLE TO FIND Take by mouth 3 (three) times daily. Collegen supplements   [DISCONTINUED] Multiple Vitamin (OMNICAP) TABS Take by mouth.   No facility-administered encounter medications on file as of 06/11/2019.    Review of Systems  Constitutional: Negative for appetite change and unexpected weight change.  HENT: Negative for congestion and sinus pressure.   Respiratory: Negative for cough, chest tightness and shortness of breath.   Cardiovascular: Negative for chest pain, palpitations and leg swelling.  Gastrointestinal: Negative for abdominal pain, diarrhea, nausea and vomiting.  Genitourinary: Negative for difficulty urinating and dysuria.  Musculoskeletal: Negative for joint swelling and myalgias.  Skin: Negative for color change and rash.  Neurological: Negative for dizziness, light-headedness and headaches.  Psychiatric/Behavioral: Negative for agitation and dysphoric mood.       Objective:    Physical Exam Constitutional:      General: She is not in acute distress.    Appearance: Normal appearance.  HENT:     Head: Normocephalic and atraumatic.     Right Ear: External ear normal.     Left Ear: External ear normal.  Eyes:     General: No scleral icterus.       Right eye: No discharge.        Left eye: No discharge.     Conjunctiva/sclera:  Conjunctivae normal.  Neck:     Musculoskeletal: Neck supple. No muscular tenderness.     Thyroid: No thyromegaly.  Cardiovascular:     Rate and Rhythm: Normal rate and regular rhythm.  Pulmonary:     Effort: No respiratory  distress.     Breath sounds: Normal breath sounds. No wheezing.  Abdominal:     General: Bowel sounds are normal.     Palpations: Abdomen is soft.     Tenderness: There is no abdominal tenderness.  Musculoskeletal:        General: No swelling or tenderness.  Lymphadenopathy:     Cervical: No cervical adenopathy.  Skin:    Findings: No erythema or rash.  Neurological:     Mental Status: She is alert.  Psychiatric:        Mood and Affect: Mood normal.        Behavior: Behavior normal.     BP 128/78    Pulse 66    Temp 97.8 F (36.6 C)    Resp 16    Wt 155 lb 3.2 oz (70.4 kg)    SpO2 99%    BMI 27.49 kg/m  Wt Readings from Last 3 Encounters:  06/11/19 155 lb 3.2 oz (70.4 kg)  06/05/19 157 lb 9.6 oz (71.5 kg)  09/04/18 150 lb 12.8 oz (68.4 kg)     Lab Results  Component Value Date   WBC 5.7 07/25/2018   HGB 13.9 07/25/2018   HCT 40.7 07/25/2018   PLT 252.0 07/25/2018   GLUCOSE 92 03/13/2019   CHOL 195 03/13/2019   TRIG 112.0 03/13/2019   HDL 69.70 03/13/2019   LDLDIRECT 118.2 10/16/2006   LDLCALC 103 (H) 03/13/2019   ALT 27 03/13/2019   AST 20 03/13/2019   NA 140 03/13/2019   K 3.9 03/22/2019   CL 102 03/13/2019   CREATININE 0.73 03/13/2019   BUN 25 (H) 03/13/2019   CO2 31 03/13/2019   TSH 3.96 03/13/2019    US Breast [place] Ax[place]  Result Date: 08/31/2018 CLINICAL DATA:  Short-term interval follow-up of a 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 EXAM: DIGITAL DIAGNOSTIC BILATERAL MAMMOGRAM WITH CAD AND TOMO ULTRASOUND RIGHT BREAST COMPARISON:  Previous exam(s). ACR Breast Density Category c: The breast tissue is heterogeneously dense, which may obscure small masses. FINDINGS: Circumscribed mass in the central aspect of the  right breast is stable. No suspicious mass or malignant type microcalcifications identified in either breast. Mammographic images were processed with CAD. Targeted ultrasound is performed, showing a well-circumscribed hypoechoic mass in the right breast at 7:30 3 cm from the nipple measured 1.1 x 0.3 x 0.9 cm. On the prior ultrasound dated 08/11/2016 it measured 1.3 x 0.4 x 1.0 cm. 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 No evidence of malignancy in either breast. RECOMMENDATION: Bilateral screening mammogram in 1 year is recommended. I have discussed the findings and recommendations with the patient. Results were also provided in writing at the conclusion of the visit. If applicable, a reminder letter will be sent to the patient regarding the next appointment. BI-RADS CATEGORY  2: Benign. Electronically Signed   By: Lillia Mountain M.D.   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 EXAM: DIGITAL DIAGNOSTIC BILATERAL MAMMOGRAM WITH CAD AND TOMO ULTRASOUND RIGHT BREAST COMPARISON:  Previous exam(s). ACR Breast Density Category c: The breast tissue is heterogeneously dense, which may obscure small masses. FINDINGS: Circumscribed mass in the central aspect of the right breast is stable. No suspicious mass or malignant type microcalcifications identified in either breast. Mammographic images were processed with CAD. Targeted ultrasound is performed, showing  a well-circumscribed hypoechoic mass in the right breast at 7:30 3 cm from the nipple measured 1.1 x 0.3 x 0.9 cm. On the prior ultrasound dated 08/11/2016 it measured 1.3 x 0.4 x 1.0 cm. 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 No evidence of malignancy in either breast. RECOMMENDATION: Bilateral screening mammogram in 1 year is recommended. I have discussed the findings  and recommendations with the patient. Results were also provided in writing at the conclusion of the visit. If applicable, a reminder letter will be sent to the patient regarding the next appointment. BI-RADS CATEGORY  2: Benign. Electronically Signed   By: Lillia Mountain M.D.   On: 08/31/2018 14:53       Assessment & Plan:   Problem List Items Addressed This Visit    Alopecia    Has a history of alopecia.  Follow up with dermatology.        Anxiety    Overall doing well.  Takes 1/2 lunesta to sleep.  Follow.        Environmental allergies    Controlled.       Essential hypertension    Blood pressure as outlined.  Doing well.  Follow pressures.  Follow metabolic panel.       Relevant Orders   CBC with Differential/Platelet   Basic metabolic panel   Hyperlipidemia    Low cholesterol diet and exercise.  On crestor.  Follow lipid panel and liver function tests.        Relevant Orders   Hepatic function panel   Lipid panel   Hypothyroidism    On thyroid replacement.  Follow tsh.            Einar Pheasant, MD

## 2019-06-16 ENCOUNTER — Encounter: Payer: Self-pay | Admitting: Internal Medicine

## 2019-06-16 NOTE — Assessment & Plan Note (Signed)
Low cholesterol diet and exercise.  On crestor.  Follow lipid panel and liver function tests.  

## 2019-06-16 NOTE — Assessment & Plan Note (Signed)
On thyroid replacement.  Follow tsh.  

## 2019-06-16 NOTE — Assessment & Plan Note (Signed)
Controlled.  

## 2019-06-16 NOTE — Assessment & Plan Note (Signed)
Overall doing well.  Takes 1/2 lunesta to sleep.  Follow.

## 2019-06-16 NOTE — Assessment & Plan Note (Signed)
Has a history of alopecia.  Follow up with dermatology.

## 2019-06-16 NOTE — Assessment & Plan Note (Signed)
Blood pressure as outlined.  Doing well.  Follow pressures.  Follow metabolic panel.  

## 2019-06-23 ENCOUNTER — Encounter: Payer: Self-pay | Admitting: Internal Medicine

## 2019-06-24 ENCOUNTER — Telehealth: Payer: Self-pay

## 2019-06-24 ENCOUNTER — Other Ambulatory Visit: Payer: Self-pay

## 2019-06-24 DIAGNOSIS — Z20822 Contact with and (suspected) exposure to covid-19: Secondary | ICD-10-CM

## 2019-06-24 NOTE — Telephone Encounter (Signed)
Spoke with patient. She was tested today. Still no symptoms. In laws are doing okay. They were not tested but are self isolating. We are going to hold on visit for now. Pt is going to let me know if she needs anything more.

## 2019-06-24 NOTE — Telephone Encounter (Signed)
Please call and confirm she and her n-laws are doing ok.  Ok to be tested.  Can schedule a doxy appt if desires.  Will need to quarantine.

## 2019-06-24 NOTE — Telephone Encounter (Signed)
Copied from [place] #[id number]. Topic: General - Inquiry >> Jun 24, 2019  9:44 AM Mathis Bud wrote: Reason for CRM: patient called requesting a call back from PCP nurse. Patient did not want to disclose. Call back [phone]

## 2019-06-24 NOTE — Telephone Encounter (Signed)
My chart message sent over to provider.

## 2019-06-25 LAB — NOVEL CORONAVIRUS, NAA: SARS-CoV-2, NAA: NOT DETECTED

## 2019-06-27 ENCOUNTER — Telehealth: Payer: Self-pay | Admitting: Internal Medicine

## 2019-06-27 NOTE — Telephone Encounter (Signed)
Negative COVID results given. Patient results "NOT Detected." Caller expressed understanding. ° °

## 2019-07-15 ENCOUNTER — Other Ambulatory Visit: Payer: Self-pay | Admitting: Internal Medicine

## 2019-07-15 DIAGNOSIS — Z1231 Encounter for screening mammogram for malignant neoplasm of breast: Secondary | ICD-10-CM

## 2019-07-26 ENCOUNTER — Other Ambulatory Visit: Payer: Self-pay | Admitting: Internal Medicine

## 2019-08-23 DIAGNOSIS — H25013 Cortical age-related cataract, bilateral: Secondary | ICD-10-CM | POA: Diagnosis not present

## 2019-08-25 ENCOUNTER — Encounter: Payer: Self-pay | Admitting: Internal Medicine

## 2019-09-02 ENCOUNTER — Ambulatory Visit
Admission: RE | Admit: 2019-09-02 | Discharge: 2019-09-02 | Disposition: A | Discharge status: Home / Self Care | Payer: PPO | Source: Ambulatory Visit | Attending: Internal Medicine | Admitting: Internal Medicine

## 2019-09-02 DIAGNOSIS — Z1231 Encounter for screening mammogram for malignant neoplasm of breast: Secondary | ICD-10-CM | POA: Insufficient documentation

## 2019-09-04 ENCOUNTER — Encounter: Payer: Self-pay | Admitting: Internal Medicine

## 2019-09-10 ENCOUNTER — Ambulatory Visit: Payer: PPO

## 2019-09-17 ENCOUNTER — Other Ambulatory Visit: Payer: Self-pay | Admitting: Internal Medicine

## 2019-09-21 ENCOUNTER — Ambulatory Visit: Payer: PPO | Attending: Internal Medicine

## 2019-09-21 DIAGNOSIS — Z23 Encounter for immunization: Secondary | ICD-10-CM | POA: Insufficient documentation

## 2019-09-21 NOTE — Progress Notes (Signed)
   Covid-19 Vaccination Clinic  Name:  [name] [name]    MRN: [id number] DOB: August 18, 1950  09/21/2019  [name] was observed post Covid-19 immunization for 15 minutes without incidence. She was provided with Vaccine Information Sheet and instruction to access the V-Safe system.   [name] was instructed to call 911 with any severe reactions post vaccine: Marland Kitchen Difficulty breathing  . Swelling of your face and throat  . A fast heartbeat  . A bad rash all over your body  . Dizziness and weakness    Immunizations Administered    Name Date Dose VIS Date Route   Pfizer COVID-19 Vaccine 09/21/2019  8:41 AM 0.3 mL 07/26/2019 Intramuscular   Manufacturer: [place]   Lot: [id number]   [place]: [id number]

## 2019-09-24 ENCOUNTER — Other Ambulatory Visit: Payer: Self-pay | Admitting: Internal Medicine

## 2019-09-27 ENCOUNTER — Ambulatory Visit: Payer: PPO

## 2019-10-15 ENCOUNTER — Ambulatory Visit: Payer: PPO | Attending: Internal Medicine

## 2019-10-15 DIAGNOSIS — Z23 Encounter for immunization: Secondary | ICD-10-CM

## 2019-10-15 NOTE — Progress Notes (Signed)
   Covid-19 Vaccination Clinic  Name:  [name] [name]    MRN: [id number] DOB: 08-07-1951  10/15/2019  [name] was observed post Covid-19 immunization for 15 minutes without incident. She was provided with Vaccine Information Sheet and instruction to access the V-Safe system.   [name] was instructed to call 911 with any severe reactions post vaccine: Marland Kitchen Difficulty breathing  . Swelling of face and throat  . A fast heartbeat  . A bad rash all over body  . Dizziness and weakness   Immunizations Administered    Name Date Dose VIS Date Route   Pfizer COVID-19 Vaccine 10/15/2019  8:41 AM 0.3 mL 07/26/2019 Intramuscular   Manufacturer: [place]   Lot: [id number]   [place]: [id number]

## 2019-10-21 ENCOUNTER — Other Ambulatory Visit (INDEPENDENT_AMBULATORY_CARE_PROVIDER_SITE_OTHER): Payer: PPO

## 2019-10-21 ENCOUNTER — Other Ambulatory Visit: Payer: Self-pay

## 2019-10-21 DIAGNOSIS — I10 Essential (primary) hypertension: Secondary | ICD-10-CM

## 2019-10-21 DIAGNOSIS — E785 Hyperlipidemia, unspecified: Secondary | ICD-10-CM

## 2019-10-21 LAB — CBC WITH DIFFERENTIAL/PLATELET
Basophils Absolute: 0 10*3/uL (ref 0.0–0.1)
Basophils Relative: 0.7 % (ref 0.0–3.0)
Eosinophils Absolute: 0.3 10*3/uL (ref 0.0–0.7)
Eosinophils Relative: 5.8 % — ABNORMAL HIGH (ref 0.0–5.0)
HCT: 37.6 % (ref 36.0–46.0)
Hemoglobin: 13 g/dL (ref 12.0–15.0)
Lymphocytes Relative: 29.4 % (ref 12.0–46.0)
Lymphs Abs: 1.6 10*3/uL (ref 0.7–4.0)
MCHC: 34.6 g/dL (ref 30.0–36.0)
MCV: 87.2 fl (ref 78.0–100.0)
Monocytes Absolute: 0.5 10*3/uL (ref 0.1–1.0)
Monocytes Relative: 8.9 % (ref 3.0–12.0)
Neutro Abs: 2.9 10*3/uL (ref 1.4–7.7)
Neutrophils Relative %: 55.2 % (ref 43.0–77.0)
Platelets: 272 10*3/uL (ref 150.0–400.0)
RBC: 4.32 Mil/uL (ref 3.87–5.11)
RDW: 12.6 % (ref 11.5–15.5)
WBC: 5.3 10*3/uL (ref 4.0–10.5)

## 2019-10-21 LAB — BASIC METABOLIC PANEL
BUN: 18 mg/dL (ref 6–23)
CO2: 30 mEq/L (ref 19–32)
Calcium: 9.6 mg/dL (ref 8.4–10.5)
Chloride: 102 mEq/L (ref 96–112)
Creatinine, Ser: 0.72 mg/dL (ref 0.40–1.20)
GFR: 80 mL/min (ref >60.00)
Glucose, Bld: 101 mg/dL — ABNORMAL HIGH (ref 70–99)
Potassium: 4.2 mEq/L (ref 3.5–5.1)
Sodium: 138 mEq/L (ref 135–145)

## 2019-10-21 LAB — HEPATIC FUNCTION PANEL
ALT: 28 U/L (ref 0–35)
AST: 22 U/L (ref 0–37)
Albumin: 4.3 g/dL (ref 3.5–5.2)
Alkaline Phosphatase: 73 U/L (ref 39–117)
Bilirubin, Direct: 0.1 mg/dL (ref 0.0–0.3)
Total Bilirubin: 0.4 mg/dL (ref 0.2–1.2)
Total Protein: 6.4 g/dL (ref 6.0–8.3)

## 2019-10-21 LAB — LIPID PANEL
Cholesterol: 177 mg/dL (ref 0–200)
HDL: 60.5 mg/dL (ref >39.00)
LDL Cholesterol: 94 mg/dL (ref 0–99)
NonHDL: 116.39
Total CHOL/HDL Ratio: 3
Triglycerides: 110 mg/dL (ref 0.0–149.0)
VLDL: 22 mg/dL (ref 0.0–40.0)

## 2019-10-23 ENCOUNTER — Ambulatory Visit (INDEPENDENT_AMBULATORY_CARE_PROVIDER_SITE_OTHER): Payer: PPO | Admitting: Internal Medicine

## 2019-10-23 ENCOUNTER — Other Ambulatory Visit: Payer: Self-pay

## 2019-10-23 VITALS — BP 134/78 | HR 66 | Temp 97.5°F | Resp 16 | Ht 63.0 in | Wt 156.6 lb

## 2019-10-23 DIAGNOSIS — L989 Disorder of the skin and subcutaneous tissue, unspecified: Secondary | ICD-10-CM

## 2019-10-23 DIAGNOSIS — Z0000 Encounter for general adult medical examination without abnormal findings: Secondary | ICD-10-CM

## 2019-10-23 DIAGNOSIS — E039 Hypothyroidism, unspecified: Secondary | ICD-10-CM | POA: Diagnosis not present

## 2019-10-23 DIAGNOSIS — I10 Essential (primary) hypertension: Secondary | ICD-10-CM

## 2019-10-23 DIAGNOSIS — G479 Sleep disorder, unspecified: Secondary | ICD-10-CM

## 2019-10-23 DIAGNOSIS — E785 Hyperlipidemia, unspecified: Secondary | ICD-10-CM

## 2019-10-23 MED ORDER — ACYCLOVIR 5 % EX OINT: TOPICAL_OINTMENT | CUTANEOUS | 0 refills | Status: DC

## 2019-10-23 NOTE — Assessment & Plan Note (Addendum)
Physical 10/23/19.  Colonoscopy 07/2013.  Mammogram 09/02/19 - Birads I.  Discussed bone density.  She wants to hold on scheduling right now.  Discussed scheduling with next mammogram.  Hemoccult cards given.

## 2019-10-23 NOTE — Progress Notes (Signed)
Patient ID: [name] [name], female   DOB: 1951-07-02, 69 y.o.   MRN: [id number]   Subjective:    Patient ID: [name] [name], female    DOB: 03-May-1951, 69 y.o.   MRN: [id number]  HPI This visit occurred during the SARS-CoV-2 public health emergency.  Safety protocols were in place, including screening questions prior to the visit, additional usage of staff PPE, and extensive cleaning of exam room while observing appropriate contact time as indicated for disinfecting solutions.  Patient here for her physical exam.  She reports she is doing well.  Handling stress.  Not exercising as much due to covid restrictions.  No chest pain or sob.  No acid reflux reported.  No abdominal pain.  Bowels moving.  Persistent nasal lesion - outer nasal area. Has tried steroid cream and bactroban.  Had skin cancer removed from scalp.  Also had eye checked 08/2019 - cataract.  Blood pressure has been doing well - averaging 120s/60-70s.  Sleeps 7.5-8 hours per night.  Sleeping better.  Discussed bone density.  Wants to hold on scheduling.     Past Medical History:  Diagnosis Date  . Adenomatous polyp 2005  . Arrhythmia    H/O  . Basal cell carcinoma   . CTS (carpal tunnel syndrome)   . Environmental allergies   . History of chicken pox   . HTN (hypertension)   . Hypercholesterolemia   . Hypothyroidism    Past Surgical History:  Procedure Laterality Date  . BASAL CELL CARCINOMA EXCISION     right hip,abd, right arm, back neck  . BREAST CYST ASPIRATION Left    negative  . CTS releast Rt wrist    . lipoma removal     Rt shoulder   . MELANOMA EXCISION     right buttocks, top of scalp  . POLYPECTOMY     + TA  . TUBAL LIGATION  1982   Family History  Problem Relation Age of Onset  . Cervical cancer Mother   . Arthritis Mother   . Hypertension Mother   . Sudden death Mother        cerbral hemorrage  . Heart disease Father   . Cancer Father        lung cancer  . Diabetes Maternal Grandmother   .  Breast cancer Paternal Aunt 74  . Colon cancer Neg Hx    Social History   Socioeconomic History  . Marital status: Widowed    Spouse name: Not on file  . Number of children: 1  . Years of education: Not on file  . Highest education level: Not on file  Occupational History  . 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 Smoking status: Former Smoker    Types: Cigarettes    Quit date: 07/16/1980    Years since quitting: 39.3  . Smokeless tobacco: Never Used  Substance and Sexual Activity  . Alcohol use: Yes    Alcohol/week: 7.0 standard drinks    Types: 7 Glasses of wine per week    Comment: 1 per day   . Drug use: No  . Sexual activity: Never  Other Topics Concern  . Not on file  Social History Narrative   Widowed; 1 son.    Retired Art therapist   Daily caffeine use 2-3/day       Cell # [id number]   Social Determinants of Health   Financial Resource Strain:   . Difficulty of  Paying Living Expenses:   Food Insecurity:   . Worried About Charity fundraiser in the Last Year:   . Arboriculturist in the Last Year:   Transportation Needs:   . Film/video editor (Medical):   Marland Kitchen Lack of Transportation (Non-Medical):   Physical Activity: Unknown  . Days of Exercise per Week: 7 days  . Minutes of Exercise per Session: Not on file  Stress:   . Feeling of Stress :   Social Connections:   . Frequency of Communication with Friends and Family:   . Frequency of Social Gatherings with Friends and Family:   . Attends Religious Services:   . Active Member of Clubs or Organizations:   . Attends Archivist Meetings:   Marland Kitchen Marital Status:     Outpatient Encounter Medications as of 10/23/2019  Medication Sig  . SYNTHROID 175 MCG tablet TAKE 1 TABLET EVERY DAY ON EMPTY STOMACHWITH A GLASS OF WATER AT LEAST 30-60 MINBEFORE BREAKFAST  . acyclovir ointment (ZOVIRAX) 5 % Apply to affected area on nose bid  . ammonium lactate  (GERI-HYDROLAC 5) 5 % LOTN lotion Apply 1 application topically 2 (two) times daily.  Marland Kitchen azelastine (ASTELIN) 0.1 % nasal spray USE ONE SPRAY INTO EACH NOSTRIL TWICE A DAY  . cetirizine (ZYRTEC) 10 MG tablet Take 10 mg by mouth daily.  . cholecalciferol (VITAMIN D) 1000 units tablet Take 1,000 Units by mouth daily.  . clobetasol (TEMOVATE) 0.05 % external solution Apply 1 application topically daily.  . Eszopiclone 3 MG TABS TAKE ONE TABLET BY MOUTH IMMEDIATELY BEFORE BEDTIME  . famotidine (PEPCID) 20 MG tablet Take 20 mg by mouth daily.  . fluticasone (FLONASE) 50 MCG/ACT nasal spray USE 2 SPRAYS IN EACH NOSTRIL EVERY DAY  . hydrocortisone 2.5 % ointment   . irbesartan (AVAPRO) 75 MG tablet TAKE ONE TABLET BY MOUTH EVERY DAY  . Multiple Vitamins-Minerals (HAIR SKIN AND NAILS FORMULA PO) Take 1 tablet by mouth daily.  . mupirocin ointment (BACTROBAN) 2 % Apply to affected area bid  . Nutritional Supplements (NUTRITIONAL SUPPLEMENT PO) Take by mouth. DoTERRA: ALPHA CRS+, xEO MEGA, MICROPLEX MVp, ON GUARD (protective blend)  . Probiotic Product (PROBIOTIC-10 PO) Take by mouth.  . rosuvastatin (CRESTOR) 5 MG tablet TAKE ONE TABLET EVERY DAY  . UNABLE TO FIND Take by mouth 3 (three) times daily. Collegen supplements  . [DISCONTINUED] sulfamethoxazole-trimethoprim (BACTRIM DS) 800-160 MG tablet Take 1 tablet by mouth 2 (two) times daily.   No facility-administered encounter medications on file as of 10/23/2019.    Review of Systems  Constitutional: Negative for appetite change and unexpected weight change.  HENT: Negative for congestion and sinus pressure.        Persistent nasal irritation as outlined.   Eyes: Negative for pain and visual disturbance.  Respiratory: Negative for cough, chest tightness and shortness of breath.   Cardiovascular: Negative for chest pain, palpitations and leg swelling.  Gastrointestinal: Negative for abdominal pain, diarrhea, nausea and vomiting.  Genitourinary:  Negative for difficulty urinating and dysuria.  Musculoskeletal: Negative for joint swelling and myalgias.  Skin: Negative for color change and rash.  Neurological: Negative for dizziness, light-headedness and headaches.  Hematological: Negative for adenopathy. Does not bruise/bleed easily.  Psychiatric/Behavioral: Negative for agitation and dysphoric mood.       Objective:    Physical Exam Constitutional:      General: She is not in acute distress.    Appearance: Normal appearance. She is well-developed.  HENT:     Head: Normocephalic and atraumatic.     Right Ear: External ear normal.     Left Ear: External ear normal.     Nose:     Comments: Erythema - external.  Eyes:     General: No scleral icterus.       Right eye: No discharge.        Left eye: No discharge.     Conjunctiva/sclera: Conjunctivae normal.  Neck:     Thyroid: No thyromegaly.  Cardiovascular:     Rate and Rhythm: Normal rate and regular rhythm.  Pulmonary:     Effort: No tachypnea, accessory muscle usage or respiratory distress.     Breath sounds: Normal breath sounds. No decreased breath sounds or wheezing.  Chest:     Breasts:        Right: No inverted nipple, mass, nipple discharge or tenderness (no axillary adenopathy).        Left: No inverted nipple, mass, nipple discharge or tenderness (no axilarry adenopathy).  Abdominal:     General: Bowel sounds are normal.     Palpations: Abdomen is soft.     Tenderness: There is no abdominal tenderness.  Musculoskeletal:        General: No swelling or tenderness.     Cervical back: Neck supple. No tenderness.  Lymphadenopathy:     Cervical: No cervical adenopathy.  Skin:    Findings: No erythema or rash.  Neurological:     Mental Status: She is alert and oriented to person, place, and time.  Psychiatric:        Mood and Affect: Mood normal.        Behavior: Behavior normal.     BP 134/78   Pulse 66   Temp (!) 97.5 F (36.4 C)   Resp 16   Ht  5\' 3"  (1.6 m)   Wt 156 lb 9.6 oz (71 kg)   SpO2 98%   BMI 27.74 kg/m  Wt Readings from Last 3 Encounters:  10/23/19 156 lb 9.6 oz (71 kg)  06/11/19 155 lb 3.2 oz (70.4 kg)  06/05/19 157 lb 9.6 oz (71.5 kg)     Lab Results  Component Value Date   WBC 5.3 10/21/2019   HGB 13.0 10/21/2019   HCT 37.6 10/21/2019   PLT 272.0 10/21/2019   GLUCOSE 101 (H) 10/21/2019   CHOL 177 10/21/2019   TRIG 110.0 10/21/2019   HDL 60.50 10/21/2019   LDLDIRECT 118.2 10/16/2006   LDLCALC 94 10/21/2019   ALT 28 10/21/2019   AST 22 10/21/2019   NA 138 10/21/2019   K 4.2 10/21/2019   CL 102 10/21/2019   CREATININE 0.72 10/21/2019   BUN 18 10/21/2019   CO2 30 10/21/2019   TSH 3.96 03/13/2019    MM 3D SCREEN BREAST BILATERAL  Result Date: 09/02/2019 CLINICAL DATA:  Screening. EXAM: DIGITAL SCREENING BILATERAL MAMMOGRAM WITH TOMO AND CAD COMPARISON:  Previous exam(s). ACR Breast Density Category c: The breast tissue is heterogeneously dense, which may obscure small masses. FINDINGS: There are no findings suspicious for malignancy. Images were processed with CAD. IMPRESSION: No mammographic evidence of malignancy. A result letter of this screening mammogram will be mailed directly to the patient. RECOMMENDATION: Screening mammogram in one year. (Code:SM-B-01Y) BI-RADS CATEGORY  1: Negative. Electronically Signed   By: Lillia Mountain M.D.   On: 09/02/2019 16:42       Assessment & Plan:   Problem List Items Addressed This Visit  Essential hypertension    Blood pressure under good control.  Continue same medication regimen - on avapro.  Follow pressures.  Follow metabolic panel.        Relevant Orders   Basic metabolic panel   External nasal lesion    Has tried steroid cream and bactroban.  Trial of acyclovir.  If persistent, will have dermatology evaluate.       Health care maintenance    Physical 10/23/19.  Colonoscopy 07/2013.  Mammogram 09/02/19 - Birads I.  Discussed bone density.  She wants  to hold on scheduling right now.  Discussed scheduling with next mammogram.  Hemoccult cards given.        Hyperlipidemia    On crestor.  Low cholesterol diet and exercise.  Follow lipid panel and liver function tests.        Relevant Orders   Hepatic function panel   Lipid panel   Hypothyroidism    On thyroid replacement.  Follow tsh.        Sleeping difficulty    Uses lunesta.  Sleeping better.         Other Visit Diagnoses    Routine general medical examination at a health care facility    -  Primary       Einar Pheasant, MD

## 2019-10-27 ENCOUNTER — Encounter: Payer: Self-pay | Admitting: Internal Medicine

## 2019-10-27 DIAGNOSIS — L989 Disorder of the skin and subcutaneous tissue, unspecified: Secondary | ICD-10-CM | POA: Insufficient documentation

## 2019-10-27 NOTE — Assessment & Plan Note (Signed)
Blood pressure under good control.  Continue same medication regimen - on avapro.  Follow pressures.  Follow metabolic panel.

## 2019-10-27 NOTE — Assessment & Plan Note (Signed)
Uses lunesta.  Sleeping better.

## 2019-10-27 NOTE — Assessment & Plan Note (Signed)
On crestor.  Low cholesterol diet and exercise.  Follow lipid panel and liver function tests.   

## 2019-10-27 NOTE — Assessment & Plan Note (Signed)
On thyroid replacement.  Follow tsh.  

## 2019-10-27 NOTE — Assessment & Plan Note (Signed)
Has tried steroid cream and bactroban.  Trial of acyclovir.  If persistent, will have dermatology evaluate.

## 2019-10-30 ENCOUNTER — Other Ambulatory Visit: Payer: Self-pay | Admitting: Internal Medicine

## 2019-11-01 NOTE — Telephone Encounter (Signed)
rx called in to pharmacy [name]) [name] [name] #30 with one refill.

## 2019-11-01 NOTE — Telephone Encounter (Signed)
Last OV 10/23/2019 Next OV 03/03/20 Last refill 05/07/19

## 2019-11-04 ENCOUNTER — Other Ambulatory Visit: Payer: Self-pay | Admitting: Internal Medicine

## 2019-11-05 NOTE — Telephone Encounter (Signed)
Pt called to check on status of refill  

## 2019-11-19 ENCOUNTER — Encounter: Payer: Self-pay | Admitting: Internal Medicine

## 2019-11-20 ENCOUNTER — Other Ambulatory Visit: Payer: Self-pay

## 2019-11-20 MED ORDER — AZELASTINE HCL 0.1 % NA SOLN: NASAL | 0 refills | Status: DC

## 2019-11-26 ENCOUNTER — Other Ambulatory Visit: Payer: Self-pay | Admitting: Internal Medicine

## 2019-12-23 ENCOUNTER — Other Ambulatory Visit: Payer: Self-pay | Admitting: Internal Medicine

## 2019-12-23 NOTE — Telephone Encounter (Signed)
Refill request for lunesta, last seen 10-23-19, last filled 05-06-20.  Please advise.

## 2019-12-24 NOTE — Telephone Encounter (Signed)
PDMP reviewed. rx ok'd for lunesta #30 with one refill.  (on file for future refills).

## 2020-02-24 ENCOUNTER — Ambulatory Visit (INDEPENDENT_AMBULATORY_CARE_PROVIDER_SITE_OTHER): Payer: PPO

## 2020-02-24 VITALS — Ht 63.0 in | Wt 137.2 lb

## 2020-02-24 DIAGNOSIS — Z0000 Encounter for general adult medical examination without abnormal findings: Secondary | ICD-10-CM

## 2020-02-24 NOTE — Patient Instructions (Addendum)
[name] [name] , Thank you for taking time to come for your Medicare Wellness Visit. I appreciate your ongoing commitment to your health goals. Please review the following plan we discussed and let me know if I can assist you in the future.   These are the goals we discussed: Goals      Patient Stated   .  DIET - INCREASE LEAN PROTEINS (pt-stated)      Low carb intake Lose weight Reduce sugar intake       This is a list of the screening recommended for you and due dates:  Health Maintenance  Topic Date Due  . Flu Shot  03/15/2020  . Mammogram  09/01/2020  . Tetanus Vaccine  05/19/2021  . Colon Cancer Screening  07/27/2023  . DEXA scan (bone density measurement)  Completed  . COVID-19 Vaccine  Completed  .  Hepatitis C: One time screening is recommended by Center for Disease Control  (CDC) for  adults born from [birth year] through 1967.   Completed  . Pneumonia vaccines  Completed    Immunizations Immunization History  Administered Date(s) Administered  . Influenza Split 05/06/2014  . Influenza, High Dose Seasonal PF 04/14/2016, 05/25/2018, 05/24/2019  . Influenza,inj,Quad PF,6+ Mos 05/13/2015  . PFIZER SARS-COV-2 Vaccination 09/21/2019, 10/15/2019  . Pneumococcal Conjugate-13 06/20/2014  . Pneumococcal Polysaccharide-23 06/25/2015  . Tdap 05/20/2011  . Zoster 09/20/2013  . Zoster Recombinat (Shingrix) 04/26/2018, 07/05/2018   Keep all routine maintenance appointments.   Next scheduled lab 02/28/20 @ 8:00  Follow up 03/03/20 @ 9:00  Advanced directives: yes on file  Conditions/risks identified: none new  Follow up in one year for your annual wellness visit.   Preventive Care 45 Years and Older, Female Preventive care refers to lifestyle choices and visits with your health care provider that can promote health and wellness. What does preventive care include?  A yearly physical exam. This is also called an annual well check.  Dental exams once or twice a year.  Routine  eye exams. Ask your health care provider how often you should have your eyes checked.  Personal lifestyle choices, including:  Daily care of your teeth and gums.  Regular physical activity.  Eating a healthy diet.  Avoiding tobacco and drug use.  Limiting alcohol use.  Practicing safe sex.  Taking low-dose aspirin every day.  Taking vitamin and mineral supplements as recommended by your health care provider. What happens during an annual well check? The services and screenings done by your health care provider during your annual well check will depend on your age, overall health, lifestyle risk factors, and family history of disease. Counseling  Your health care provider may ask you questions about your:  Alcohol use.  Tobacco use.  Drug use.  Emotional well-being.  Home and relationship well-being.  Sexual activity.  Eating habits.  History of falls.  Memory and ability to understand (cognition).  Work and work Statistician.  Reproductive health. Screening  You may have the following tests or measurements:  Height, weight, and BMI.  Blood pressure.  Lipid and cholesterol levels. These may be checked every 5 years, or more frequently if you are over 25 years old.  Skin check.  Lung cancer screening. You may have this screening every year starting at age 75 if you have a 30-pack-year history of smoking and currently smoke or have quit within the past 15 years.  Fecal occult blood test (FOBT) of the stool. You may have this test every year starting at  age 58.  Flexible sigmoidoscopy or colonoscopy. You may have a sigmoidoscopy every 5 years or a colonoscopy every 10 years starting at age 62.  Hepatitis C blood test.  Hepatitis B blood test.  Sexually transmitted disease (STD) testing.  Diabetes screening. This is done by checking your blood sugar (glucose) after you have not eaten for a while (fasting). You may have this done every 1-3 years.  Bone  density scan. This is done to screen for osteoporosis. You may have this done starting at age 15.  Mammogram. This may be done every 1-2 years. Talk to your health care provider about how often you should have regular mammograms. Talk with your health care provider about your test results, treatment options, and if necessary, the need for more tests. Vaccines  Your health care provider may recommend certain vaccines, such as:  Influenza vaccine. This is recommended every year.  Tetanus, diphtheria, and acellular pertussis (Tdap, Td) vaccine. You may need a Td booster every 10 years.  Zoster vaccine. You may need this after age 59.  Pneumococcal 13-valent conjugate (PCV13) vaccine. One dose is recommended after age 26.  Pneumococcal polysaccharide (PPSV23) vaccine. One dose is recommended after age 47. Talk to your health care provider about which screenings and vaccines you need and how often you need them. This information is not intended to replace advice given to you by your health care provider. Make sure you discuss any questions you have with your health care provider. Document Released: 08/28/2015 Document Revised: 04/20/2016 Document Reviewed: 06/02/2015 Elsevier Interactive Patient Education  2017 [place] Prevention in the Home Falls can cause injuries. They can happen to people of all ages. There are many things you can do to make your home safe and to help prevent falls. What can I do on the outside of my home?  Regularly fix the edges of walkways and driveways and fix any cracks.  Remove anything that might make you trip as you walk through a door, such as a raised step or threshold.  Trim any bushes or trees on the path to your home.  Use bright outdoor lighting.  Clear any walking paths of anything that might make someone trip, such as rocks or tools.  Regularly check to see if handrails are loose or broken. Make sure that both sides of any steps have  handrails.  Any raised decks and porches should have guardrails on the edges.  Have any leaves, snow, or ice cleared regularly.  Use sand or salt on walking paths during winter.  Clean up any spills in your garage right away. This includes oil or grease spills. What can I do in the bathroom?  Use night lights.  Install grab bars by the toilet and in the tub and shower. Do not use towel bars as grab bars.  Use non-skid mats or decals in the tub or shower.  If you need to sit down in the shower, use a plastic, non-slip stool.  Keep the floor dry. Clean up any water that spills on the floor as soon as it happens.  Remove soap buildup in the tub or shower regularly.  Attach bath mats securely with double-sided non-slip rug tape.  Do not have throw rugs and other things on the floor that can make you trip. What can I do in the bedroom?  Use night lights.  Make sure that you have a light by your bed that is easy to reach.  Do not use any  sheets or blankets that are too big for your bed. They should not hang down onto the floor.  Have a firm chair that has side arms. You can use this for support while you get dressed.  Do not have throw rugs and other things on the floor that can make you trip. What can I do in the kitchen?  Clean up any spills right away.  Avoid walking on wet floors.  Keep items that you use a lot in easy-to-reach places.  If you need to reach something above you, use a strong step stool that has a grab bar.  Keep electrical cords out of the way.  Do not use floor polish or wax that makes floors slippery. If you must use wax, use non-skid floor wax.  Do not have throw rugs and other things on the floor that can make you trip. What can I do with my stairs?  Do not leave any items on the stairs.  Make sure that there are handrails on both sides of the stairs and use them. Fix handrails that are broken or loose. Make sure that handrails are as long as  the stairways.  Check any carpeting to make sure that it is firmly attached to the stairs. Fix any carpet that is loose or worn.  Avoid having throw rugs at the top or bottom of the stairs. If you do have throw rugs, attach them to the floor with carpet tape.  Make sure that you have a light switch at the top of the stairs and the bottom of the stairs. If you do not have them, ask someone to add them for you. What else can I do to help prevent falls?  Wear shoes that:  Do not have high heels.  Have rubber bottoms.  Are comfortable and fit you well.  Are closed at the toe. Do not wear sandals.  If you use a stepladder:  Make sure that it is fully opened. Do not climb a closed stepladder.  Make sure that both sides of the stepladder are locked into place.  Ask someone to hold it for you, if possible.  Clearly mark and make sure that you can see:  Any grab bars or handrails.  First and last steps.  Where the edge of each step is.  Use tools that help you move around (mobility aids) if they are needed. These include:  Canes.  Walkers.  Scooters.  Crutches.  Turn on the lights when you go into a dark area. Replace any light bulbs as soon as they burn out.  Set up your furniture so you have a clear path. Avoid moving your furniture around.  If any of your floors are uneven, fix them.  If there are any pets around you, be aware of where they are.  Review your medicines with your doctor. Some medicines can make you feel dizzy. This can increase your chance of falling. Ask your doctor what other things that you can do to help prevent falls. This information is not intended to replace advice given to you by your health care provider. Make sure you discuss any questions you have with your health care provider. Document Released: 05/28/2009 Document Revised: 01/07/2016 Document Reviewed: 09/05/2014 Elsevier Interactive Patient Education  2017 [employer].

## 2020-02-24 NOTE — Progress Notes (Addendum)
Subjective:   [name] [name] is a 69 y.o. female who presents for Medicare Annual (Subsequent) preventive examination.  Review of Systems    No ROS.  Medicare Wellness Virtual Visit.   Cardiac Risk Factors include: advanced age (>33men, >63 women);hypertension     Objective:    Today's Vitals   02/24/20 0939  Weight: 137 lb 3.2 oz (62.2 kg)  Height: 5\' 3"  (1.6 m)   Body mass index is 24.3 kg/m.  Advanced Directives 02/24/2020 02/21/2019 02/19/2018 02/17/2017  Does Patient Have a Medical Advance Directive? Yes Yes Yes Yes  Type of Paramedic of [place];Living will [place];Living will Living will;Healthcare Power of [place];Living will  Does patient want to make changes to medical advance directive? No - Patient declined No - Patient declined No - Patient declined No - Patient declined  Copy of [place] in Chart? Yes - validated most recent copy scanned in chart (See row information) Yes - validated most recent copy scanned in chart (See row information) Yes No - copy requested    Current Medications (verified) Outpatient Encounter Medications as of 02/24/2020  Medication Sig  . acyclovir ointment (ZOVIRAX) 5 % Apply to affected area on nose bid  . ammonium lactate (GERI-HYDROLAC 5) 5 % LOTN lotion Apply 1 application topically 2 (two) times daily.  Marland Kitchen azelastine (ASTELIN) 0.1 % nasal spray USE ONE SPRAY INTO EACH NOSTRIL TWICE A DAY  . cetirizine (ZYRTEC) 10 MG tablet Take 10 mg by mouth daily.  . cholecalciferol (VITAMIN D) 1000 units tablet Take 1,000 Units by mouth daily.  . clobetasol (TEMOVATE) 0.05 % external solution Apply 1 application topically daily.  . Eszopiclone 3 MG TABS TAKE ONE TABLET BY MOUTH IMMEDIATELY BEFORE BEDTIME  . famotidine (PEPCID) 20 MG tablet Take 20 mg by mouth daily.  . fluticasone (FLONASE) 50 MCG/ACT nasal spray USE 2 SPRAYS IN EACH NOSTRIL EVERY DAY  .  hydrocortisone 2.5 % ointment   . irbesartan (AVAPRO) 75 MG tablet TAKE ONE TABLET BY MOUTH EVERY DAY  . Multiple Vitamins-Minerals (HAIR SKIN AND NAILS FORMULA PO) Take 1 tablet by mouth daily.  . mupirocin ointment (BACTROBAN) 2 % Apply to affected area bid  . Nutritional Supplements (NUTRITIONAL SUPPLEMENT PO) Take by mouth. DoTERRA: ALPHA CRS+, xEO MEGA, MICROPLEX MVp, ON GUARD (protective blend)  . Probiotic Product (PROBIOTIC-10 PO) Take by mouth.  . rosuvastatin (CRESTOR) 5 MG tablet TAKE ONE TABLET EVERY DAY  . SYNTHROID 175 MCG tablet TAKE 1 TABLET EVERY DAY ON EMPTY STOMACHWITH A GLASS OF WATER AT LEAST 30-60 MINBEFORE BREAKFAST  . UNABLE TO FIND Take by mouth 3 (three) times daily. Collegen supplements   No facility-administered encounter medications on file as of 02/24/2020.    Allergies (verified) Codeine and Penicillins   History: Past Medical History:  Diagnosis Date  . Adenomatous polyp 2005  . Arrhythmia    H/O  . Basal cell carcinoma   . CTS (carpal tunnel syndrome)   . Environmental allergies   . History of chicken pox   . HTN (hypertension)   . Hypercholesterolemia   . Hypothyroidism    Past Surgical History:  Procedure Laterality Date  . BASAL CELL CARCINOMA EXCISION     right hip,abd, right arm, back neck  . BREAST CYST ASPIRATION Left    negative  . CTS releast Rt wrist    . lipoma removal     Rt shoulder   .  MELANOMA EXCISION     right buttocks, top of scalp  . POLYPECTOMY     + TA  . TUBAL LIGATION  1982   Family History  Problem Relation Age of Onset  . Cervical cancer Mother   . Arthritis Mother   . Hypertension Mother   . Sudden death Mother        cerbral hemorrage  . Heart disease Father   . Cancer Father        lung cancer  . Diabetes Maternal Grandmother   . Breast cancer Paternal Aunt 74  . Colon cancer Neg Hx    Social History   Socioeconomic History  . Marital status: Widowed    Spouse name: Not on file  . Number of  children: 1  . Years of education: Not on file  . Highest education level: Not on file  Occupational History  . 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 Smoking status: Former Smoker    Types: Cigarettes    Quit date: 07/16/1980    Years since quitting: 39.6  . Smokeless tobacco: Never Used  Vaping Use  . Vaping Use: Never used  Substance and Sexual Activity  . Alcohol use: Yes    Alcohol/week: 7.0 standard drinks    Types: 7 Glasses of wine per week    Comment: 1 per day   . Drug use: No  . Sexual activity: Never  Other Topics Concern  . Not on file  Social History Narrative   Widowed; 1 son.    Retired Art therapist   Daily caffeine use 2-3/day       Cell # [id number]   Social Determinants of Radio broadcast assistant Strain:   . Difficulty of Paying Living Expenses:   Food Insecurity: No Food Insecurity  . Worried About Charity fundraiser in the Last Year: Never true  . Ran Out of Food in the Last Year: Never true  Transportation Needs:   . Lack of Transportation (Medical):   Marland Kitchen Lack of Transportation (Non-Medical):   Physical Activity:   . Days of Exercise per Week:   . Minutes of Exercise per Session:   Stress:   . Feeling of Stress :   Social Connections: Unknown  . Frequency of Communication with Friends and Family: More than three times a week  . Frequency of Social Gatherings with Friends and Family: More than three times a week  . Attends Religious Services: Not on file  . Active Member of Clubs or Organizations: Not on file  . Attends Archivist Meetings: Not on file  . Marital Status: Not on file    Tobacco Counseling Counseling given: Not Answered   Clinical Intake:  Pre-visit preparation completed: Yes        Diabetes: No            Activities of Daily Living In your present state of health, do you have any difficulty performing the following activities: 02/24/2020  Hearing?  N  Vision? N  Difficulty concentrating or making decisions? N  Walking or climbing stairs? N  Dressing or bathing? N  Doing errands, shopping? N  Preparing Food and eating ? N  Using the Toilet? N  In the past six months, have you accidently leaked urine? N  Do you have problems with loss of bowel control? N  Managing your Medications? N  Managing your Finances? N  Housekeeping or managing your  Housekeeping? N  Some recent data might be hidden    Patient Care Team: Einar Pheasant, MD as PCP - General (Internal Medicine)  Indicate any recent Medical Services you may have received from other than [facility] providers in the past year (date may be approximate).     Assessment:   This is a routine wellness examination for [name].  I connected with Truman Hayward today by telephone and verified that I am speaking with the correct person using two identifiers. Location patient: home Location provider: work Persons participating in the virtual visit: patient, Marine scientist.    I discussed the limitations, risks, security and privacy concerns of performing an evaluation and management service by telephone and the availability of in person appointments. The patient expressed understanding and verbally consented to this telephonic visit.    Interactive audio and video telecommunications were attempted between this provider and patient, however failed, due to patient having technical difficulties OR patient did not have access to video capability.  We continued and completed visit with audio only.  Some vital signs may be absent or patient reported.   Hearing/Vision screen  Hearing Screening   125Hz  250Hz  500Hz  1000Hz  2000Hz  3000Hz  4000Hz  6000Hz  8000Hz   Right ear:           Left ear:           Comments: Patient is able to hear conversational tones without difficulty.  No issues reported.  Vision Screening Comments: Follow[facility] Wears corrective lenses Visual acuity not assessed, virtual  visit.  They have seen their ophthalmologist in the last 12 months.    Dietary issues and exercise activities discussed: Current Exercise Habits: Home exercise routine, Type of exercise: walking, Time (Minutes): 30, Frequency (Times/Week): 5, Weekly Exercise (Minutes/Week): 150, Intensity: Moderate  Goals      Patient Stated   .  DIET - INCREASE LEAN PROTEINS (pt-stated)      Low carb intake Lose weight Reduce sugar intake      Depression Screen PHQ 2/9 Scores 02/24/2020 02/21/2019 02/19/2018 08/31/2017 02/17/2017 07/22/2016 06/08/2016  PHQ - 2 Score 0 0 0 0 - 0 0  PHQ- 9 Score - - - 1 - - -  Exception Documentation - - - - Other- indicate reason in comment box - -  Not completed - - - - Recent loss of spouse.  Currently in grief counseling.  Followed by PCP. - -    Fall Risk Fall Risk  02/24/2020 06/05/2019 02/21/2019 02/19/2018 02/17/2017  Falls in the past year? 0 0 0 No No  Number falls in past yr: 0 - - - -  Follow up Falls evaluation completed Falls evaluation completed - - -   Handrails in use when climbing stairs? Yes  Home free of loose throw rugs in walkways, pet beds, electrical cords, etc? Yes  Adequate lighting in your home to reduce risk of falls? Yes   TIMED UP AND GO: Was the test performed? No . Virtual visit.  Cognitive Function: MMSE - Mini Mental State Exam 02/19/2018 02/17/2017  Orientation to time 5 5  Orientation to Place 5 5  Registration 3 3  Attention/ Calculation 5 5  Recall 3 3  Language- name 2 objects 2 2  Language- repeat 1 1  Language- follow 3 step command 3 3  Language- read & follow direction 1 1  Write a sentence 1 1  Copy design 1 1  Total score 30 30     6CIT Screen 02/24/2020 02/21/2019  What Year? 0 points 0 points  What month? 0 points 0 points  What time? - 0 points  Count back from 20 - 0 points  Months in reverse - 0 points  Repeat phrase - 0 points  Total Score - 0   Immunizations Immunization History  Administered Date(s)  Administered  . Influenza Split 05/06/2014  . Influenza, High Dose Seasonal PF 04/14/2016, 05/25/2018, 05/24/2019  . Influenza,inj,Quad PF,6+ Mos 05/13/2015  . PFIZER SARS-COV-2 Vaccination 09/21/2019, 10/15/2019  . Pneumococcal Conjugate-13 06/20/2014  . Pneumococcal Polysaccharide-23 06/25/2015  . Tdap 05/20/2011  . Zoster 09/20/2013  . Zoster Recombinat (Shingrix) 04/26/2018, 07/05/2018   Health Maintenance There are no preventive care reminders to display for this patient. Health Maintenance  Topic Date Due  . INFLUENZA VACCINE  03/15/2020  . MAMMOGRAM  09/01/2020  . TETANUS/TDAP  05/19/2021  . COLONOSCOPY  07/27/2023  . DEXA SCAN  Completed  . COVID-19 Vaccine  Completed  . Hepatitis C Screening  Completed  . PNA vac Low Risk Adult  Completed   Dental Screening: Recommended annual dental exams for proper oral hygiene  Community Resource Referral / Chronic Care Management: CRR required this visit?  No   CCM required this visit?  No      Plan:   Keep all routine maintenance appointments.   Next scheduled lab 02/28/20 @ 8:00  Follow up 03/03/20 @ 9:00  I have personally reviewed and noted the following in the patient's chart:   . Medical and social history . Use of alcohol, tobacco or illicit drugs  . Current medications and supplements . Functional ability and status . Nutritional status . Physical activity . Advanced directives . List of other physicians . Hospitalizations, surgeries, and ER visits in previous 12 months . Vitals . Screenings to include cognitive, depression, and falls . Referrals and appointments  In addition, I have reviewed and discussed with patient certain preventive protocols, quality metrics, and best practice recommendations. A written personalized care plan for preventive services as well as general preventive health recommendations were provided to patient via mychart.     Varney Biles, LPN   9/[id number]/[id number]    Reviewed  above information.  Agree with assessment and plan.   Dr Nicki Reaper

## 2020-02-28 ENCOUNTER — Other Ambulatory Visit (INDEPENDENT_AMBULATORY_CARE_PROVIDER_SITE_OTHER): Payer: PPO

## 2020-02-28 ENCOUNTER — Other Ambulatory Visit: Payer: Self-pay

## 2020-02-28 DIAGNOSIS — E785 Hyperlipidemia, unspecified: Secondary | ICD-10-CM | POA: Diagnosis not present

## 2020-02-28 DIAGNOSIS — I10 Essential (primary) hypertension: Secondary | ICD-10-CM | POA: Diagnosis not present

## 2020-02-28 LAB — LIPID PANEL
Cholesterol: 203 mg/dL — ABNORMAL HIGH (ref 0–200)
HDL: 69.3 mg/dL (ref >39.00)
LDL Cholesterol: 119 mg/dL — ABNORMAL HIGH (ref 0–99)
NonHDL: 133.85
Total CHOL/HDL Ratio: 3
Triglycerides: 75 mg/dL (ref 0.0–149.0)
VLDL: 15 mg/dL (ref 0.0–40.0)

## 2020-02-28 LAB — BASIC METABOLIC PANEL
BUN: 15 mg/dL (ref 6–23)
CO2: 29 mEq/L (ref 19–32)
Calcium: 9.3 mg/dL (ref 8.4–10.5)
Chloride: 102 mEq/L (ref 96–112)
Creatinine, Ser: 0.73 mg/dL (ref 0.40–1.20)
GFR: 78.66 mL/min (ref >60.00)
Glucose, Bld: 96 mg/dL (ref 70–99)
Potassium: 4.3 mEq/L (ref 3.5–5.1)
Sodium: 138 mEq/L (ref 135–145)

## 2020-02-28 LAB — HEPATIC FUNCTION PANEL
ALT: 22 U/L (ref 0–35)
AST: 19 U/L (ref 0–37)
Albumin: 4.4 g/dL (ref 3.5–5.2)
Alkaline Phosphatase: 81 U/L (ref 39–117)
Bilirubin, Direct: 0.1 mg/dL (ref 0.0–0.3)
Total Bilirubin: 0.4 mg/dL (ref 0.2–1.2)
Total Protein: 6.1 g/dL (ref 6.0–8.3)

## 2020-03-03 ENCOUNTER — Other Ambulatory Visit: Payer: Self-pay

## 2020-03-03 ENCOUNTER — Ambulatory Visit (INDEPENDENT_AMBULATORY_CARE_PROVIDER_SITE_OTHER): Payer: PPO | Admitting: Internal Medicine

## 2020-03-03 VITALS — BP 112/74 | HR 68 | Temp 98.1°F | Resp 16 | Ht 63.0 in | Wt 139.0 lb

## 2020-03-03 DIAGNOSIS — E785 Hyperlipidemia, unspecified: Secondary | ICD-10-CM | POA: Diagnosis not present

## 2020-03-03 DIAGNOSIS — G479 Sleep disorder, unspecified: Secondary | ICD-10-CM | POA: Diagnosis not present

## 2020-03-03 DIAGNOSIS — Z1211 Encounter for screening for malignant neoplasm of colon: Secondary | ICD-10-CM | POA: Diagnosis not present

## 2020-03-03 DIAGNOSIS — E039 Hypothyroidism, unspecified: Secondary | ICD-10-CM

## 2020-03-03 DIAGNOSIS — I10 Essential (primary) hypertension: Secondary | ICD-10-CM | POA: Diagnosis not present

## 2020-03-03 NOTE — Progress Notes (Signed)
Patient ID: LEANNE SISLER, female   DOB: 1950/11/23, 69 y.o.   MRN: [id number]   Subjective:    Patient ID: Jennette Dubin, female    DOB: [birth year], 69 y.o.   MRN: [id number]  HPI This visit occurred during the SARS-CoV-2 public health emergency.  Safety protocols were in place, including screening questions prior to the visit, additional usage of staff PPE, and extensive cleaning of exam room while observing appropriate contact time as indicated for disinfecting solutions.  Patient here for a scheduled follow up.  She is doing well.  Feels good.  Staying active.  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  Has adjusted her diet.  Decreased sugar and carb intake.  Stopped crestor for a few months.  Back on now since cholesterol numbers increased some.  No chest pain or sob reported.  No abdominal pain or bowel change reported.  Handling stress.  New granddaughter - 4 months old.   Past Medical History:  Diagnosis Date   Adenomatous polyp 2005   Arrhythmia    H/O   Basal cell carcinoma    CTS (carpal tunnel syndrome)    Environmental allergies    History of chicken pox    HTN (hypertension)    Hypercholesterolemia    Hypothyroidism    Past Surgical History:  Procedure Laterality Date   BASAL CELL CARCINOMA EXCISION     right hip,abd, right arm, back neck   BREAST CYST ASPIRATION Left    negative   CTS releast Rt wrist     lipoma removal     Rt shoulder    MELANOMA EXCISION     right buttocks, top of scalp   POLYPECTOMY     + TA   TUBAL LIGATION  1982   Family History  Problem Relation Age of Onset   Cervical cancer Mother    Arthritis Mother    Hypertension Mother    Sudden death Mother        cerbral hemorrage   Heart disease Father    Cancer Father        lung cancer   Diabetes Maternal Grandmother    Breast cancer Paternal Aunt 53   Colon cancer Neg Hx    Social History   Socioeconomic History   Marital status: Widowed    Spouse name: Not  on file   Number of children: 1   Years of education: Not on file   Highest education level: Not on file  Occupational History   Occupation: ScheduliOfficeng/ Ortho     Employer: [employer]  Tobacco Use   Smoking status: Former Smoker    Types: Cigarettes    Quit date: 07/16/1980    Years since quitting: 39.6   Smokeless tobacco: Never Used  Vaping Use   Vaping Use: Never used  Substance and Sexual Activity   Alcohol use: Yes    Alcohol/week: 7.0 standard drinks    Types: 7 Glasses of wine per week    Comment: 1 per day    Drug use: No   Sexual activity: Never  Other Topics Concern   Not on file  Social History Narrative   Widowed; 1 son.    Retired Art therapist   Daily caffeine use 2-3/day       Cell # [id number]   Social Determinants of Radio broadcast assistant Strain:    Difficulty of Paying Living Expenses:   Food Insecurity: No Food Insecurity   Worried About Estate manager/land agent  of Food in the Last Year: Never true   Ran Out of Food in the Last Year: Never true  Transportation Needs:    Lack of Transportation (Medical):    Lack of Transportation (Non-Medical):   Physical Activity:    Days of Exercise per Week:    Minutes of Exercise per Session:   Stress:    Feeling of Stress :   Social Connections: Unknown   Frequency of Communication with Friends and Family: More than three times a week   Frequency of Social Gatherings with Friends and Family: More than three times a week   Attends Religious Services: Not on file   Active Member of Clubs or Organizations: Not on file   Attends Archivist Meetings: Not on file   Marital Status: Not on file    Outpatient Encounter Medications as of 03/03/2020  Medication Sig   acyclovir ointment (ZOVIRAX) 5 % Apply to affected area on nose bid   ammonium lactate (GERI-HYDROLAC 5) 5 % LOTN lotion Apply 1 application topically 2 (two) times daily.   azelastine (ASTELIN) 0.1 % nasal  spray USE ONE SPRAY INTO EACH NOSTRIL TWICE A DAY   cetirizine (ZYRTEC) 10 MG tablet Take 10 mg by mouth daily.   cholecalciferol (VITAMIN D) 1000 units tablet Take 1,000 Units by mouth daily.   clobetasol (TEMOVATE) 0.05 % external solution Apply 1 application topically daily.   Eszopiclone 3 MG TABS TAKE ONE TABLET BY MOUTH IMMEDIATELY BEFORE BEDTIME   famotidine (PEPCID) 20 MG tablet Take 20 mg by mouth daily.   fluticasone (FLONASE) 50 MCG/ACT nasal spray USE 2 SPRAYS IN EACH NOSTRIL EVERY DAY   hydrocortisone 2.5 % ointment    irbesartan (AVAPRO) 75 MG tablet TAKE ONE TABLET BY MOUTH EVERY DAY   Multiple Vitamins-Minerals (HAIR SKIN AND NAILS FORMULA PO) Take 1 tablet by mouth daily.   mupirocin ointment (BACTROBAN) 2 % Apply to affected area bid   Nutritional Supplements (NUTRITIONAL SUPPLEMENT PO) Take by mouth. DoTERRA: ALPHA CRS+, xEO MEGA, MICROPLEX MVp, ON GUARD (protective blend)   Probiotic Product (PROBIOTIC-10 PO) Take by mouth.   SYNTHROID 175 MCG tablet TAKE 1 TABLET EVERY DAY ON EMPTY STOMACHWITH A GLASS OF WATER AT LEAST 30-60 MINBEFORE BREAKFAST   UNABLE TO FIND Take by mouth 3 (three) times daily. Collegen supplements   [DISCONTINUED] rosuvastatin (CRESTOR) 5 MG tablet TAKE ONE TABLET EVERY DAY   No facility-administered encounter medications on file as of 03/03/2020.    Review of Systems  Constitutional: Negative for appetite change and unexpected weight change.  HENT: Negative for congestion and sinus pressure.   Respiratory: Negative for cough, chest tightness and shortness of breath.   Cardiovascular: Negative for chest pain, palpitations and leg swelling.  Gastrointestinal: Negative for abdominal pain, diarrhea, nausea and vomiting.  Genitourinary: Negative for difficulty urinating and dysuria.  Musculoskeletal: Negative for joint swelling and myalgias.  Skin: Negative for color change and rash.  Neurological: Negative for dizziness,  light-headedness and headaches.  Psychiatric/Behavioral: Negative for agitation and dysphoric mood.       Objective:    Physical Exam Vitals reviewed.  Constitutional:      General: She is not in acute distress.    Appearance: Normal appearance.  HENT:     Head: Normocephalic and atraumatic.     Right Ear: External ear normal.     Left Ear: External ear normal.  Eyes:     General: No scleral icterus.  Right eye: No discharge.        Left eye: No discharge.     Conjunctiva/sclera: Conjunctivae normal.  Neck:     Thyroid: No thyromegaly.  Cardiovascular:     Rate and Rhythm: Normal rate and regular rhythm.  Pulmonary:     Effort: No respiratory distress.     Breath sounds: Normal breath sounds. No wheezing.  Abdominal:     General: Bowel sounds are normal.     Palpations: Abdomen is soft.     Tenderness: There is no abdominal tenderness.  Musculoskeletal:        General: No swelling or tenderness.     Cervical back: Neck supple. No tenderness.  Lymphadenopathy:     Cervical: No cervical adenopathy.  Skin:    Findings: No erythema or rash.  Neurological:     Mental Status: She is alert.  Psychiatric:        Mood and Affect: Mood normal.        Behavior: Behavior normal.     BP 112/74    Pulse 68    Temp 98.1 F (36.7 C)    Resp 16    Ht 5\' 3"  (1.6 m)    Wt 139 lb (63 kg)    SpO2 98%    BMI 24.62 kg/m  Wt Readings from Last 3 Encounters:  03/03/20 139 lb (63 kg)  02/24/20 137 lb 3.2 oz (62.2 kg)  10/23/19 156 lb 9.6 oz (71 kg)     Lab Results  Component Value Date   WBC 5.3 10/21/2019   HGB 13.0 10/21/2019   HCT 37.6 10/21/2019   PLT 272.0 10/21/2019   GLUCOSE 96 02/28/2020   CHOL 203 (H) 02/28/2020   TRIG 75.0 02/28/2020   HDL 69.30 02/28/2020   LDLDIRECT 118.2 10/16/2006   LDLCALC 119 (H) 02/28/2020   ALT 22 02/28/2020   AST 19 02/28/2020   NA 138 02/28/2020   K 4.3 02/28/2020   CL 102 02/28/2020   CREATININE 0.73 02/28/2020   BUN 15  02/28/2020   CO2 29 02/28/2020   TSH 3.96 03/13/2019    MM 3D SCREEN BREAST BILATERAL  Result Date: 09/02/2019 CLINICAL DATA:  Screening. EXAM: DIGITAL SCREENING BILATERAL MAMMOGRAM WITH TOMO AND CAD COMPARISON:  Previous exam(s). ACR Breast Density Category c: The breast tissue is heterogeneously dense, which may obscure small masses. FINDINGS: There are no findings suspicious for malignancy. Images were processed with CAD. IMPRESSION: No mammographic evidence of malignancy. A result letter of this screening mammogram will be mailed directly to the patient. RECOMMENDATION: Screening mammogram in one year. (Code:SM-B-01Y) BI-RADS CATEGORY  1: Negative. Electronically Signed   By: Lillia Mountain M.D.   On: 09/02/2019 16:42       Assessment & Plan:   Problem List Items Addressed This Visit    Essential hypertension    Blood pressure doing well.  Continue avapro.  Follow pressures.  Follow metabolic panel.       Relevant Orders   Basic metabolic panel   Hyperlipidemia    Low cholesterol diet and exercise.  On crestor.  Follow lipid panel and liver function tests.  Has adjusted her diet.        Relevant Orders   Hepatic function panel   Lipid panel   Hypothyroidism    On thyroid replacement.  Follow tsh.       Relevant Orders   TSH   Sleeping difficulty    Uses lunesta.  Sleeping well.  Follow.  Other Visit Diagnoses    Colon cancer screening    -  Primary   Relevant Orders   Fecal occult blood, imunochemical       Einar Pheasant, MD

## 2020-03-05 ENCOUNTER — Other Ambulatory Visit: Payer: Self-pay | Admitting: Internal Medicine

## 2020-03-08 ENCOUNTER — Encounter: Payer: Self-pay | Admitting: Internal Medicine

## 2020-03-08 NOTE — Assessment & Plan Note (Signed)
On thyroid replacement.  Follow tsh.  

## 2020-03-08 NOTE — Assessment & Plan Note (Signed)
Low cholesterol diet and exercise.  On crestor.  Follow lipid panel and liver function tests.  Has adjusted her diet.

## 2020-03-08 NOTE — Assessment & Plan Note (Signed)
Uses lunesta.  Sleeping well.  Follow.

## 2020-03-08 NOTE — Assessment & Plan Note (Signed)
Blood pressure doing well.  Continue avapro.  Follow pressures.  Follow metabolic panel.

## 2020-03-13 ENCOUNTER — Other Ambulatory Visit (INDEPENDENT_AMBULATORY_CARE_PROVIDER_SITE_OTHER): Payer: PPO

## 2020-03-13 DIAGNOSIS — Z1211 Encounter for screening for malignant neoplasm of colon: Secondary | ICD-10-CM | POA: Diagnosis not present

## 2020-03-13 LAB — FECAL OCCULT BLOOD, IMMUNOCHEMICAL: Fecal Occult Bld: POSITIVE — AB

## 2020-03-17 ENCOUNTER — Other Ambulatory Visit: Payer: Self-pay | Admitting: Internal Medicine

## 2020-03-18 ENCOUNTER — Other Ambulatory Visit: Payer: Self-pay | Admitting: Internal Medicine

## 2020-03-18 DIAGNOSIS — R195 Other fecal abnormalities: Secondary | ICD-10-CM

## 2020-03-18 NOTE — Progress Notes (Signed)
Order placed for GI referral.   

## 2020-03-20 ENCOUNTER — Telehealth: Payer: Self-pay | Admitting: Gastroenterology

## 2020-03-20 NOTE — Telephone Encounter (Signed)
Hey Dr. Bryan Lemma, this patient is being referred to [place] for hem. + stool. She was a patient of Dr. Deatra Ina and then switched to [facility] in 2017. Her records are in epic. You are the DOD from when we received referral. Please advise on scheduling. Thank you!

## 2020-03-23 ENCOUNTER — Encounter: Payer: Self-pay | Admitting: Internal Medicine

## 2020-03-23 DIAGNOSIS — R195 Other fecal abnormalities: Secondary | ICD-10-CM

## 2020-03-23 NOTE — Telephone Encounter (Signed)
Order placed for GI referral.   

## 2020-03-26 NOTE — Telephone Encounter (Signed)
Recent records reviewed.  Please schedule OV with me to discuss FOBT positive stool and further evaluation.  Thank you.

## 2020-03-27 NOTE — Telephone Encounter (Signed)
Spoke with patient, she states she did not realize Dr. Bryan Lemma was in [facility] and wants to stay close to her house. She will call back if she decides to go forward with an appointment.

## 2020-03-30 ENCOUNTER — Other Ambulatory Visit: Payer: Self-pay | Admitting: Internal Medicine

## 2020-04-16 ENCOUNTER — Other Ambulatory Visit: Payer: Self-pay | Admitting: Internal Medicine

## 2020-04-17 NOTE — Telephone Encounter (Signed)
rx ok'd for lunesta #30 with one refill.   

## 2020-04-24 DIAGNOSIS — Z859 Personal history of malignant neoplasm, unspecified: Secondary | ICD-10-CM | POA: Diagnosis not present

## 2020-04-24 DIAGNOSIS — Z872 Personal history of diseases of the skin and subcutaneous tissue: Secondary | ICD-10-CM | POA: Diagnosis not present

## 2020-04-24 DIAGNOSIS — Z85820 Personal history of malignant melanoma of skin: Secondary | ICD-10-CM | POA: Diagnosis not present

## 2020-04-24 DIAGNOSIS — L578 Other skin changes due to chronic exposure to nonionizing radiation: Secondary | ICD-10-CM | POA: Diagnosis not present

## 2020-04-24 DIAGNOSIS — Z85828 Personal history of other malignant neoplasm of skin: Secondary | ICD-10-CM | POA: Diagnosis not present

## 2020-04-24 DIAGNOSIS — L638 Other alopecia areata: Secondary | ICD-10-CM | POA: Diagnosis not present

## 2020-04-28 ENCOUNTER — Other Ambulatory Visit: Payer: Self-pay | Admitting: Internal Medicine

## 2020-05-18 ENCOUNTER — Encounter: Payer: Self-pay | Admitting: Internal Medicine

## 2020-05-21 ENCOUNTER — Other Ambulatory Visit: Payer: Self-pay | Admitting: Internal Medicine

## 2020-05-21 ENCOUNTER — Other Ambulatory Visit: Payer: Self-pay

## 2020-05-21 DIAGNOSIS — Z20822 Contact with and (suspected) exposure to covid-19: Secondary | ICD-10-CM

## 2020-05-21 DIAGNOSIS — Z789 Other specified health status: Secondary | ICD-10-CM

## 2020-05-21 NOTE — Progress Notes (Signed)
Order placed for covid antibody test.  °

## 2020-05-25 ENCOUNTER — Other Ambulatory Visit: Payer: Self-pay

## 2020-05-25 ENCOUNTER — Other Ambulatory Visit (INDEPENDENT_AMBULATORY_CARE_PROVIDER_SITE_OTHER): Payer: PPO

## 2020-05-25 DIAGNOSIS — Z789 Other specified health status: Secondary | ICD-10-CM

## 2020-05-30 LAB — SARS-COV-2 SEMI-QUANTITATIVE TOTAL ANTIBODY, SPIKE: SARS COV2 AB, Total Spike Semi QN: 567.3 U/mL — ABNORMAL HIGH (ref <0.8)

## 2020-06-01 ENCOUNTER — Other Ambulatory Visit: Payer: Self-pay | Admitting: Internal Medicine

## 2020-06-03 ENCOUNTER — Encounter: Payer: Self-pay | Admitting: Gastroenterology

## 2020-06-03 ENCOUNTER — Other Ambulatory Visit: Payer: Self-pay

## 2020-06-03 ENCOUNTER — Ambulatory Visit: Payer: PPO | Admitting: Gastroenterology

## 2020-06-03 VITALS — BP 157/78 | HR 74 | Temp 98.4°F | Ht 63.0 in | Wt 142.0 lb

## 2020-06-03 DIAGNOSIS — R195 Other fecal abnormalities: Secondary | ICD-10-CM | POA: Diagnosis not present

## 2020-06-03 MED ORDER — NA SULFATE-K SULFATE-MG SULF 17.5-3.13-1.6 GM/177ML PO SOLN: 354.0000 mL | Freq: Once | ORAL | 0 refills | Status: AC

## 2020-06-03 NOTE — Progress Notes (Signed)
Cephas Darby, MD [address]  [place]  [place], [place] [address]  Main: [phone]  Fax: [phone]    Gastroenterology Consultation  Referring Provider:     Einar Pheasant, MD Primary Care Physician:  Einar Pheasant, MD Primary Gastroenterologist:  Dr. Cephas Darby Reason for Consultation:     Heme positive stool        HPI:   [name] [name] is a 69 y.o. female referred by Dr. Einar Pheasant, MD  for consultation & management of heme positive stool.  Patient had history of adenomatous colon polyp, hypertension, hypercholesterolemia, hypothyroidism who underwent fecal occult test as a colon cancer screening on 03/03/2020.  It came back positive.  Therefore, referred to GI to discuss about colonoscopy.  Patient denies having any GI symptoms today.  She was taking ashwagandha to enhance her immune system, noticed that she developed severe constipation, therefore stopped using it.  She is no longer experiencing constipation Labs are unremarkable including CBC, CMP, TSH  NSAIDs: None  Antiplts/Anticoagulants/Anti thrombotics: None  GI Procedures: Colonoscopy in 07/26/2013 by Dr. Deatra Ina Found to have diverticulosis and external hemorrhoids only She denies family history of GI malignancy She does not smoke, occasional alcohol use  Past Medical History:  Diagnosis Date  . Adenomatous polyp 2005  . Arrhythmia    H/O  . Basal cell carcinoma   . CTS (carpal tunnel syndrome)   . Environmental allergies   . History of chicken pox   . HTN (hypertension)   . Hypercholesterolemia   . Hypothyroidism     Past Surgical History:  Procedure Laterality Date  . BASAL CELL CARCINOMA EXCISION     right hip,abd, right arm, back neck  . BREAST CYST ASPIRATION Left    negative  . CTS releast Rt wrist    . lipoma removal     Rt shoulder   . MELANOMA EXCISION     right buttocks, top of scalp  . POLYPECTOMY     + TA  . TUBAL LIGATION  1982    Current Outpatient  Medications:  .  acyclovir ointment (ZOVIRAX) 5 %, Apply to affected area on nose bid, Disp: 15 g, Rfl: 0 .  ammonium lactate (GERI-HYDROLAC 5) 5 % LOTN lotion, Apply 1 application topically 2 (two) times daily., Disp: , Rfl:  .  azelastine (ASTELIN) 0.1 % nasal spray, USE ONE SPRAY INTO EACH NOSTRIL TWICE A DAY, Disp: 30 mL, Rfl: 0 .  cetirizine (ZYRTEC) 10 MG tablet, Take 10 mg by mouth daily., Disp: , Rfl:  .  cholecalciferol (VITAMIN D) 1000 units tablet, Take 1,000 Units by mouth daily., Disp: , Rfl:  .  clobetasol (TEMOVATE) 0.05 % external solution, Apply 1 application topically daily., Disp: , Rfl:  .  Eszopiclone 3 MG TABS, TAKE ONE TABLET BY MOUTH IMMEDIATELY BEFORE BEDTIME, Disp: 30 tablet, Rfl: 1 .  famotidine (PEPCID) 20 MG tablet, Take 20 mg by mouth daily., Disp: , Rfl:  .  fluticasone (FLONASE) 50 MCG/ACT nasal spray, USE 2 SPRAYS IN EACH NOSTRIL EVERY DAY, Disp: 16 g, Rfl: 1 .  hydrocortisone 2.5 % ointment, , Disp: , Rfl:  .  irbesartan (AVAPRO) 75 MG tablet, TAKE ONE TABLET BY MOUTH EVERY DAY, Disp: 90 tablet, Rfl: 1 .  Multiple Vitamins-Minerals (HAIR SKIN AND NAILS FORMULA PO), Take 1 tablet by mouth daily., Disp: , Rfl:  .  mupirocin ointment (BACTROBAN) 2 %, Apply to affected area bid, Disp: 22 g, Rfl: 0 .  Nutritional Supplements (NUTRITIONAL SUPPLEMENT PO), Take by mouth. DoTERRA: ALPHA CRS+, xEO MEGA, MICROPLEX MVp, ON GUARD (protective blend), Disp: , Rfl:  .  Probiotic Product (PROBIOTIC-10 PO), Take by mouth., Disp: , Rfl:  .  rosuvastatin (CRESTOR) 5 MG tablet, TAKE ONE TABLET EVERY DAY, Disp: 30 tablet, Rfl: 1 .  SYNTHROID 175 MCG tablet, TAKE 1 TABLET EVERY DAY ON EMPTY STOMACHWITH A GLASS OF WATER AT LEAST 30-60 MINBEFORE BREAKFAST, Disp: 30 tablet, Rfl: 1 .  UNABLE TO FIND, Take by mouth 3 (three) times daily. Collegen supplements, Disp: , Rfl:  .  Na Sulfate-K Sulfate-Mg Sulf 17.5-3.13-1.6 GM/177ML SOLN, Take 354 mLs by mouth once for 1 dose., Disp: 354 mL, Rfl:  0   Family History  Problem Relation Age of Onset  . Cervical cancer Mother   . Arthritis Mother   . Hypertension Mother   . Sudden death Mother        cerbral hemorrage  . Heart disease Father   . Cancer Father        lung cancer  . Diabetes Maternal Grandmother   . Breast cancer Paternal Aunt 74  . Colon cancer Neg Hx      Social History   Tobacco Use  . Smoking status: Former Smoker    Types: Cigarettes    Quit date: 07/16/1980    Years since quitting: 39.9  . Smokeless tobacco: Never Used  Vaping Use  . Vaping Use: Never used  Substance Use Topics  . Alcohol use: Yes    Alcohol/week: 7.0 standard drinks    Types: 7 Glasses of wine per week    Comment: 1 per day   . Drug use: No    Allergies as of 06/03/2020 - Review Complete 06/03/2020  Allergen Reaction Noted  . Codeine    . Penicillins Hives 06/26/2017    Review of Systems:    All systems reviewed and negative except where noted in HPI.   Physical Exam:  BP (!) 157/78 (BP Location: Left Arm, Patient Position: Sitting, Cuff Size: Normal)   Pulse 74   Temp 98.4 F (36.9 C) (Oral)   Ht 5\' 3"  (1.6 m)   Wt 142 lb (64.4 kg)   BMI 25.15 kg/m  No LMP recorded. Patient is postmenopausal.  General:   Alert,  Well-developed, well-nourished, pleasant and cooperative in NAD Head:  Normocephalic and atraumatic. Eyes:  Sclera clear, no icterus.   Conjunctiva pink. Ears:  Normal auditory acuity. Nose:  No deformity, discharge, or lesions. Mouth:  No deformity or lesions,oropharynx pink & moist. Neck:  Supple; no masses or thyromegaly. Lungs:  Respirations even and unlabored.  Clear throughout to auscultation.   No wheezes, crackles, or rhonchi. No acute distress. Heart:  Regular rate and rhythm; no murmurs, clicks, rubs, or gallops. Abdomen:  Normal bowel sounds. Soft, non-tender and non-distended without masses, hepatosplenomegaly or hernias noted.  No guarding or rebound tenderness.   Rectal: Not  performed Msk:  Symmetrical without gross deformities. Good, equal movement & strength bilaterally. Pulses:  Normal pulses noted. Extremities:  No clubbing or edema.  No cyanosis. Neurologic:  Alert and oriented x3;  grossly normal neurologically. Skin:  Intact without significant lesions or rashes. No jaundice. Psych:  Alert and cooperative. Normal mood and affect.  Imaging Studies: No abdominal imaging  Assessment and Plan:   ANDERSEN MCKIVER is a 69 y.o. pleasant Caucasian female with history of hypothyroidism, hypertension, hypercholesterolemia is seen in consultation for Hemoccult positive stool, to discuss about  colonoscopy, personal history of tubular adenoma of the colon  Recommend diagnostic colonoscopy  I have discussed alternative options, risks & benefits,  which include, but are not limited to, bleeding, infection, perforation,respiratory complication & drug reaction.  The patient agrees with this plan & written consent will be obtained.     Follow up as needed   Cephas Darby, MD

## 2020-06-13 ENCOUNTER — Encounter: Payer: Self-pay | Admitting: Emergency Medicine

## 2020-06-13 ENCOUNTER — Other Ambulatory Visit: Payer: Self-pay

## 2020-06-13 DIAGNOSIS — R1110 Vomiting, unspecified: Secondary | ICD-10-CM | POA: Diagnosis not present

## 2020-06-13 DIAGNOSIS — Z85828 Personal history of other malignant neoplasm of skin: Secondary | ICD-10-CM | POA: Insufficient documentation

## 2020-06-13 DIAGNOSIS — E039 Hypothyroidism, unspecified: Secondary | ICD-10-CM | POA: Insufficient documentation

## 2020-06-13 DIAGNOSIS — Z87891 Personal history of nicotine dependence: Secondary | ICD-10-CM | POA: Insufficient documentation

## 2020-06-13 DIAGNOSIS — R1013 Epigastric pain: Secondary | ICD-10-CM | POA: Insufficient documentation

## 2020-06-13 DIAGNOSIS — R112 Nausea with vomiting, unspecified: Secondary | ICD-10-CM | POA: Insufficient documentation

## 2020-06-13 DIAGNOSIS — I10 Essential (primary) hypertension: Secondary | ICD-10-CM | POA: Insufficient documentation

## 2020-06-13 DIAGNOSIS — Z79899 Other long term (current) drug therapy: Secondary | ICD-10-CM | POA: Insufficient documentation

## 2020-06-13 LAB — CBC
HCT: 36.9 % (ref 36.0–46.0)
Hemoglobin: 12.8 g/dL (ref 12.0–15.0)
MCH: 30.3 pg (ref 26.0–34.0)
MCHC: 34.7 g/dL (ref 30.0–36.0)
MCV: 87.2 fL (ref 80.0–100.0)
Platelets: 227 10*3/uL (ref 150–400)
RBC: 4.23 MIL/uL (ref 3.87–5.11)
RDW: 12.2 % (ref 11.5–15.5)
WBC: 11.1 10*3/uL — ABNORMAL HIGH (ref 4.0–10.5)
nRBC: 0 % (ref 0.0–0.2)

## 2020-06-13 LAB — COMPREHENSIVE METABOLIC PANEL
ALT: 46 U/L — ABNORMAL HIGH (ref 0–44)
AST: 26 U/L (ref 15–41)
Albumin: 4.6 g/dL (ref 3.5–5.0)
Alkaline Phosphatase: 84 U/L (ref 38–126)
Anion gap: 12 (ref 5–15)
BUN: 15 mg/dL (ref 8–23)
CO2: 24 mmol/L (ref 22–32)
Calcium: 9.3 mg/dL (ref 8.9–10.3)
Chloride: 100 mmol/L (ref 98–111)
Creatinine, Ser: 0.47 mg/dL (ref 0.44–1.00)
GFR, Estimated: >60 mL/min (ref >60)
Glucose, Bld: 139 mg/dL — ABNORMAL HIGH (ref 70–99)
Potassium: 3.8 mmol/L (ref 3.5–5.1)
Sodium: 136 mmol/L (ref 135–145)
Total Bilirubin: 1 mg/dL (ref 0.3–1.2)
Total Protein: 7 g/dL (ref 6.5–8.1)

## 2020-06-13 LAB — LIPASE, BLOOD: Lipase: 23 U/L (ref 11–51)

## 2020-06-13 MED ORDER — ONDANSETRON 4 MG PO TBDP
4.0000 mg | ORAL_TABLET | Freq: Once | ORAL | Status: AC | PRN
  Administered 2020-06-13: 4 mg via ORAL
  Filled 2020-06-13: qty 1

## 2020-06-13 NOTE — ED Triage Notes (Signed)
Pt arrived via POV with c/o vomiting since lunch time with multiple episodes, pt also c/o abd pain and dizziness as well.   Denies any surgeries to abdomen.

## 2020-06-14 ENCOUNTER — Other Ambulatory Visit: Payer: Self-pay

## 2020-06-14 ENCOUNTER — Encounter: Payer: Self-pay | Admitting: Radiology

## 2020-06-14 ENCOUNTER — Emergency Department
Admission: EM | Admit: 2020-06-14 | Discharge: 2020-06-14 | Disposition: A | Discharge status: Home / Self Care | Payer: PPO | Source: Home / Self Care | Attending: Emergency Medicine | Admitting: Emergency Medicine

## 2020-06-14 ENCOUNTER — Emergency Department: Payer: PPO

## 2020-06-14 DIAGNOSIS — E876 Hypokalemia: Secondary | ICD-10-CM | POA: Diagnosis present

## 2020-06-14 DIAGNOSIS — Z8249 Family history of ischemic heart disease and other diseases of the circulatory system: Secondary | ICD-10-CM

## 2020-06-14 DIAGNOSIS — Z85820 Personal history of malignant melanoma of skin: Secondary | ICD-10-CM

## 2020-06-14 DIAGNOSIS — Z880 Allergy status to penicillin: Secondary | ICD-10-CM

## 2020-06-14 DIAGNOSIS — R1110 Vomiting, unspecified: Secondary | ICD-10-CM | POA: Diagnosis not present

## 2020-06-14 DIAGNOSIS — Z87891 Personal history of nicotine dependence: Secondary | ICD-10-CM

## 2020-06-14 DIAGNOSIS — Z885 Allergy status to narcotic agent status: Secondary | ICD-10-CM

## 2020-06-14 DIAGNOSIS — E039 Hypothyroidism, unspecified: Secondary | ICD-10-CM | POA: Diagnosis present

## 2020-06-14 DIAGNOSIS — Z79899 Other long term (current) drug therapy: Secondary | ICD-10-CM

## 2020-06-14 DIAGNOSIS — Z9851 Tubal ligation status: Secondary | ICD-10-CM

## 2020-06-14 DIAGNOSIS — E7800 Pure hypercholesterolemia, unspecified: Secondary | ICD-10-CM | POA: Diagnosis present

## 2020-06-14 DIAGNOSIS — I10 Essential (primary) hypertension: Secondary | ICD-10-CM | POA: Diagnosis present

## 2020-06-14 DIAGNOSIS — R112 Nausea with vomiting, unspecified: Secondary | ICD-10-CM

## 2020-06-14 DIAGNOSIS — Z20822 Contact with and (suspected) exposure to covid-19: Secondary | ICD-10-CM | POA: Diagnosis present

## 2020-06-14 DIAGNOSIS — Z79890 Hormone replacement therapy (postmenopausal): Secondary | ICD-10-CM

## 2020-06-14 LAB — CBC WITH DIFFERENTIAL/PLATELET
Abs Immature Granulocytes: 0.04 10*3/uL (ref 0.00–0.07)
Basophils Absolute: 0 10*3/uL (ref 0.0–0.1)
Basophils Relative: 0 %
Eosinophils Absolute: 0.2 10*3/uL (ref 0.0–0.5)
Eosinophils Relative: 2 %
HCT: 36.9 % (ref 36.0–46.0)
Hemoglobin: 12.7 g/dL (ref 12.0–15.0)
Immature Granulocytes: 0 %
Lymphocytes Relative: 13 %
Lymphs Abs: 1.4 10*3/uL (ref 0.7–4.0)
MCH: 30 pg (ref 26.0–34.0)
MCHC: 34.4 g/dL (ref 30.0–36.0)
MCV: 87 fL (ref 80.0–100.0)
Monocytes Absolute: 0.8 10*3/uL (ref 0.1–1.0)
Monocytes Relative: 8 %
Neutro Abs: 8 10*3/uL — ABNORMAL HIGH (ref 1.7–7.7)
Neutrophils Relative %: 77 %
Platelets: 230 10*3/uL (ref 150–400)
RBC: 4.24 MIL/uL (ref 3.87–5.11)
RDW: 12.3 % (ref 11.5–15.5)
WBC: 10.4 10*3/uL (ref 4.0–10.5)
nRBC: 0 % (ref 0.0–0.2)

## 2020-06-14 LAB — URINALYSIS, COMPLETE (UACMP) WITH MICROSCOPIC
Bacteria, UA: NONE SEEN
Bilirubin Urine: NEGATIVE
Glucose, UA: NEGATIVE mg/dL
Hgb urine dipstick: NEGATIVE
Ketones, ur: 20 mg/dL — AB
Leukocytes,Ua: NEGATIVE
Nitrite: NEGATIVE
Protein, ur: NEGATIVE mg/dL
Specific Gravity, Urine: 1.026 (ref 1.005–1.030)
pH: 7 (ref 5.0–8.0)

## 2020-06-14 LAB — COMPREHENSIVE METABOLIC PANEL
ALT: 48 U/L — ABNORMAL HIGH (ref 0–44)
AST: 30 U/L (ref 15–41)
Albumin: 4.2 g/dL (ref 3.5–5.0)
Alkaline Phosphatase: 82 U/L (ref 38–126)
Anion gap: 12 (ref 5–15)
BUN: 12 mg/dL (ref 8–23)
CO2: 22 mmol/L (ref 22–32)
Calcium: 9 mg/dL (ref 8.9–10.3)
Chloride: 101 mmol/L (ref 98–111)
Creatinine, Ser: 0.54 mg/dL (ref 0.44–1.00)
GFR, Estimated: >60 mL/min (ref >60)
Glucose, Bld: 116 mg/dL — ABNORMAL HIGH (ref 70–99)
Potassium: 3.3 mmol/L — ABNORMAL LOW (ref 3.5–5.1)
Sodium: 135 mmol/L (ref 135–145)
Total Bilirubin: 1.1 mg/dL (ref 0.3–1.2)
Total Protein: 6.7 g/dL (ref 6.5–8.1)

## 2020-06-14 LAB — LIPASE, BLOOD: Lipase: 24 U/L (ref 11–51)

## 2020-06-14 MED ORDER — ONDANSETRON 4 MG PO TBDP: 4.0000 mg | ORAL_TABLET | Freq: Three times a day (TID) | ORAL | 0 refills | Status: AC | PRN

## 2020-06-14 MED ORDER — ONDANSETRON HCL 4 MG/2ML IJ SOLN
4.0000 mg | Freq: Once | INTRAMUSCULAR | Status: AC
  Administered 2020-06-14: 4 mg via INTRAVENOUS
  Filled 2020-06-14: qty 2

## 2020-06-14 MED ORDER — IOHEXOL 300 MG/ML  SOLN
100.0000 mL | Freq: Once | INTRAMUSCULAR | Status: AC | PRN
  Administered 2020-06-14: 100 mL via INTRAVENOUS

## 2020-06-14 MED ORDER — LACTATED RINGERS IV BOLUS
1000.0000 mL | Freq: Once | INTRAVENOUS | Status: AC
  Administered 2020-06-14: 1000 mL via INTRAVENOUS

## 2020-06-14 NOTE — ED Notes (Signed)
Pr provided water and crackers for PO challenge

## 2020-06-14 NOTE — ED Provider Notes (Signed)
[facility] Emergency Department Provider Note   ____________________________________________   First MD Initiated Contact with Patient 06/14/20 0045     (approximate)  I have reviewed the triage vital signs and the nursing notes.   HISTORY  Chief Complaint Emesis and Abdominal Pain    HPI [name] [name] is a 69 y.o. female with stated past medical history hypertension and hypothyroidism who presents for nausea and vomiting as well as abdominal pain.  Patient states that the nausea began yesterday as well as vomiting that began today around lunchtime and subsequently developed burning/aching, nonradiating, 6/10 midepigastric abdominal pain that is worsened with vomiting.  Patient denies any relieving factors.  Patient states she has been p.o. intolerant since today at lunchtime.  Patient denies any recent sick contacts and states that she is fully immunized against Covid         Past Medical History:  Diagnosis Date  . Adenomatous polyp 2005  . Arrhythmia    H/O  . Basal cell carcinoma   . CTS (carpal tunnel syndrome)   . Environmental allergies   . History of chicken pox   . HTN (hypertension)   . Hypercholesterolemia   . Hypothyroidism     Patient Active Problem List   Diagnosis Date Noted  . Cough 02/06/2019  . Hair thinning 07/25/2018  . Sebaceous cyst 06/26/2017  . Essential hypertension 11/09/2015  . Alopecia 03/04/2015  . Health care maintenance 10/26/2014  . Sleeping difficulty 10/26/2014  . Sleep disorder 10/26/2014  . Elevated blood pressure 06/20/2014  . Elevated blood-pressure reading without diagnosis of hypertension 06/20/2014  . Environmental allergies 11/03/2013  . Basal cell carcinoma 11/03/2013  . Vaginal dryness 11/03/2013  . Hypothyroidism 10/30/2013  . Hyperlipidemia 09/16/2013  . History of colonic polyps 03/23/2010    Past Surgical History:  Procedure Laterality Date  . BASAL CELL CARCINOMA EXCISION      right hip,abd, right arm, back neck  . BREAST CYST ASPIRATION Left    negative  . CTS releast Rt wrist    . lipoma removal     Rt shoulder   . MELANOMA EXCISION     right buttocks, top of scalp  . POLYPECTOMY     + TA  . TUBAL LIGATION  1982    Prior to Admission medications   Medication Sig Start Date End Date Taking? Authorizing Provider  acyclovir ointment (ZOVIRAX) 5 % Apply to affected area on nose bid 10/23/19   Einar Pheasant, MD  ammonium lactate (GERI-HYDROLAC 5) 5 % LOTN lotion Apply 1 application topically 2 (two) times daily.    [provider]  azelastine (ASTELIN) 0.1 % nasal spray USE ONE SPRAY INTO EACH NOSTRIL TWICE A DAY 11/20/19   Einar Pheasant, MD  cetirizine (ZYRTEC) 10 MG tablet Take 10 mg by mouth daily.    [provider]  cholecalciferol (VITAMIN D) 1000 units tablet Take 1,000 Units by mouth daily.    [provider]  clobetasol (TEMOVATE) 0.05 % external solution Apply 1 application topically daily.    [provider]  Eszopiclone 3 MG TABS TAKE ONE TABLET BY MOUTH IMMEDIATELY BEFORE BEDTIME 04/17/20   Einar Pheasant, MD  famotidine (PEPCID) 20 MG tablet Take 20 mg by mouth daily.    [provider]  fluticasone (FLONASE) 50 MCG/ACT nasal spray USE 2 SPRAYS IN EACH NOSTRIL EVERY DAY 03/31/17   Einar Pheasant, MD  hydrocortisone 2.5 % ointment  06/04/18   [provider]  irbesartan (  AVAPRO) 75 MG tablet TAKE ONE TABLET BY MOUTH EVERY DAY 03/17/20   Einar Pheasant, MD  Multiple Vitamins-Minerals (HAIR SKIN AND NAILS FORMULA PO) Take 1 tablet by mouth daily. 05/25/18   [provider]  mupirocin ointment (BACTROBAN) 2 % Apply to affected area bid 07/24/18   Einar Pheasant, MD  Nutritional Supplements (NUTRITIONAL SUPPLEMENT PO) Take by mouth. DoTERRA: ALPHA CRS+, xEO MEGA, MICROPLEX MVp, ON GUARD (protective blend)    [provider]  ondansetron (ZOFRAN ODT) 4 MG disintegrating tablet Take 1  tablet (4 mg total) by mouth every 8 (eight) hours as needed for nausea or vomiting. 06/14/20   Naaman Plummer, MD  Probiotic Product (PROBIOTIC-10 PO) Take by mouth.    [provider]  rosuvastatin (CRESTOR) 5 MG tablet TAKE ONE TABLET EVERY DAY 06/02/20   Einar Pheasant, MD  SYNTHROID 175 MCG tablet TAKE 1 TABLET EVERY DAY ON EMPTY STOMACHWITH A GLASS OF WATER AT LEAST 30-60 MINBEFORE BREAKFAST 04/28/20   Einar Pheasant, MD  UNABLE TO FIND Take by mouth 3 (three) times daily. Collegen supplements    [provider]    Allergies Codeine and Penicillins  Family History  Problem Relation Age of Onset  . Cervical cancer Mother   . Arthritis Mother   . Hypertension Mother   . Sudden death Mother        cerbral hemorrage  . Heart disease Father   . Cancer Father        lung cancer  . Diabetes Maternal Grandmother   . Breast cancer Paternal Aunt 74  . Colon cancer Neg Hx     Social History Social History   Tobacco Use  . Smoking status: Former Smoker    Types: Cigarettes    Quit date: 07/16/1980    Years since quitting: 39.9  . Smokeless tobacco: Never Used  Vaping Use  . Vaping Use: Never used  Substance Use Topics  . Alcohol use: Yes    Alcohol/week: 7.0 standard drinks    Types: 7 Glasses of wine per week    Comment: 1 per day   . Drug use: No    Review of Systems Constitutional: No fever/chills Eyes: No visual changes. ENT: No sore throat. Cardiovascular: Denies chest pain. Respiratory: Denies shortness of breath. Gastrointestinal: Endorses abdominal pain.  Endorses nausea, no vomiting.  No diarrhea. Genitourinary: Negative for dysuria. Musculoskeletal: Negative for acute arthralgias Skin: Negative for rash. Neurological: Negative for headaches, weakness/numbness/paresthesias in any extremity Psychiatric: Negative for suicidal ideation/homicidal ideation   ____________________________________________   PHYSICAL EXAM:  VITAL SIGNS: ED  Triage Vitals [06/13/20 2316]  Enc Vitals Group     BP (!) 155/67     Pulse Rate 69     Resp 20     Temp 97.9 F (36.6 C)     Temp Source Oral     SpO2 98 %     Weight 138 lb (62.6 kg)     Height 5\' 3"  (1.6 m)     Head Circumference      Peak Flow      Pain Score 8     Pain Loc      Pain Edu?      Excl. in [place]?    Constitutional: Alert and oriented. Well appearing and in no acute distress. Eyes: Conjunctivae are normal. PERRL. Head: Atraumatic. Nose: No congestion/rhinnorhea. Mouth/Throat: Mucous membranes are moist. Neck: No stridor Cardiovascular: Grossly normal heart sounds.  Good peripheral circulation. Respiratory: Normal respiratory effort.  No retractions. Gastrointestinal: Soft and mild tenderness to palpation over the midepigastric region. No distention. Musculoskeletal: No obvious deformities Neurologic:  Normal speech and language. No gross focal neurologic deficits are appreciated. Skin:  Skin is warm and dry. No rash noted. Psychiatric: Mood and affect are normal. Speech and behavior are normal.  ____________________________________________   LABS (all labs ordered are listed, but only abnormal results are displayed)  Labs Reviewed  COMPREHENSIVE METABOLIC PANEL - Abnormal; Notable for the following components:      Result Value   Glucose, Bld 139 (*)    ALT 46 (*)    All other components within normal limits  CBC - Abnormal; Notable for the following components:   WBC 11.1 (*)    All other components within normal limits  URINALYSIS, COMPLETE (UACMP) WITH MICROSCOPIC - Abnormal; Notable for the following components:   Color, Urine STRAW (*)    APPearance CLEAR (*)    Ketones, ur 20 (*)    All other components within normal limits  LIPASE, BLOOD   ____________________________________________  EKG  ED ECG REPORT I, Naaman Plummer, the attending physician, personally viewed and interpreted this ECG.  Date: 06/13/2020 EKG Time: 2325 Rate:  67 Rhythm: normal sinus rhythm QRS Axis: normal Intervals: normal ST/T Wave abnormalities: normal Narrative Interpretation: no evidence of acute ischemia  ____________________________________________  RADIOLOGY  ED MD interpretation: CT of the abdomen pelvis shows no evidence of acute abnormalities including no free air, free fluid, or other evidence of a ruptured hollow viscus.  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 CONTRAST:  128mL OMNIPAQUE IOHEXOL 300 MG/ML  SOLN COMPARISON:  08/24/2006 FINDINGS: Lower chest: The visualized lung bases are clear bilaterally. The visualized heart and pericardium are unremarkable. Small hiatal hernia. Hepatobiliary: Tiny cyst within the right hepatic lobe. Focal fatty hepatic infiltration adjacent the falciform ligament. Liver otherwise unremarkable. Gallbladder unremarkable. No intra or extrahepatic biliary ductal dilation. Pancreas: Stable 3 mm cyst within the mid body and tail of the pancreas. The pancreas is otherwise unremarkable. Spleen: Unremarkable Adrenals/Urinary Tract: Adrenal glands are unremarkable. Kidneys are normal, without renal calculi, focal lesion, or hydronephrosis. Bladder is unremarkable. Stomach/Bowel: Stomach, small bowel, and large bowel are unremarkable. Appendix normal. No free intraperitoneal gas or fluid. Vascular/Lymphatic: Mild atherosclerotic calcification within the abdominal aorta. No aortic aneurysm. No pathologic adenopathy within the abdomen and pelvis. Reproductive: Uterus and bilateral adnexa are unremarkable. Other: Rectum unremarkable Musculoskeletal: Degenerative changes  are noted within the lumbar spine. No lytic or blastic bone lesions. 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 IMPRESSION: No radiographic explanation for the patient's reported symptoms. Incidental findings as noted above. Aortic Atherosclerosis (ICD10-I70.0). Electronically Signed   By: Fidela Salisbury MD   On: 06/14/2020 02:12    ____________________________________________   PROCEDURES  Procedure(s) performed (including Critical Care):  .1-3 Lead EKG Interpretation Performed by: Naaman Plummer, MD Authorized by: Naaman Plummer, MD     Interpretation: normal     ECG rate:  87   ECG rate assessment: normal     Rhythm: sinus rhythm     Ectopy: none  Conduction: normal       ____________________________________________   INITIAL IMPRESSION / ASSESSMENT AND PLAN / ED COURSE  As part of my medical decision making, I reviewed the following data within the [place] notes reviewed and incorporated, Labs reviewed, EKG interpreted, Old chart reviewed, Radiograph reviewed and Notes from prior ED visits reviewed and incorporated        Patient presents for acute nausea/vomiting The cause of the patients symptoms is not clear, but the patient is overall well appearing and is suspected to have a transient course of illness.  Given History and Exam there does not appear to be an emergent cause of the symptoms such as small bowel obstruction, coronary syndrome, bowel ischemia, DKA, pancreatitis, appendicitis, other acute abdomen or other emergent problem.  Reassessment: After treatment, the patient is feeling much better, tolerating PO fluids, and shows no signs of dehydration.   Disposition: Discharge home with prompt primary care physician follow up in the next 48 hours. Strict return precautions discussed.       ____________________________________________   FINAL CLINICAL IMPRESSION(S) / ED DIAGNOSES  Final diagnoses:  Non-intractable vomiting with nausea, unspecified vomiting type     ED Discharge Orders         Ordered    ondansetron (ZOFRAN ODT) 4 MG disintegrating tablet  Every 8 hours PRN        06/14/20 0310           Note:  This document was prepared using Dragon voice recognition software and may include unintentional dictation errors.   Naaman Plummer, MD 06/14/20 [phone]

## 2020-06-14 NOTE — ED Triage Notes (Signed)
Pt states she has been having vomiting- pt was seen last night for same- pt states she took her zofran this morning and was able to eat a few crackers and drink some water- pt states it started wearing off around 1500 and took another around 1600- pt states she then began vomiting again and is still very nauseous

## 2020-06-14 NOTE — ED Notes (Signed)
Pt states coming into the ER for abdominal pain, nausea and vomiting that started yesterday. Pt states not being able to take home medications due to the nausea and vomiting.

## 2020-06-14 NOTE — ED Notes (Signed)
Pt states feeling better since coming in. Pt states nausea is better and has had water and crackers. Son is at bedside to take pt home. Provider notified. Pt is ready to go home.

## 2020-06-14 NOTE — ED Notes (Signed)
Pt signed paper copy of discharge paperwork.

## 2020-06-15 ENCOUNTER — Inpatient Hospital Stay
Admission: EM | Admit: 2020-06-15 | Discharge: 2020-06-16 | DRG: 392 | Disposition: A | Discharge status: Home / Self Care | Payer: PPO | Attending: Internal Medicine | Admitting: Internal Medicine

## 2020-06-15 DIAGNOSIS — R112 Nausea with vomiting, unspecified: Secondary | ICD-10-CM | POA: Diagnosis not present

## 2020-06-15 DIAGNOSIS — E039 Hypothyroidism, unspecified: Secondary | ICD-10-CM | POA: Diagnosis present

## 2020-06-15 DIAGNOSIS — Z9851 Tubal ligation status: Secondary | ICD-10-CM | POA: Diagnosis not present

## 2020-06-15 DIAGNOSIS — Z20822 Contact with and (suspected) exposure to covid-19: Secondary | ICD-10-CM | POA: Diagnosis not present

## 2020-06-15 DIAGNOSIS — Z85820 Personal history of malignant melanoma of skin: Secondary | ICD-10-CM | POA: Diagnosis not present

## 2020-06-15 DIAGNOSIS — I10 Essential (primary) hypertension: Secondary | ICD-10-CM | POA: Diagnosis not present

## 2020-06-15 DIAGNOSIS — Z79890 Hormone replacement therapy (postmenopausal): Secondary | ICD-10-CM | POA: Diagnosis not present

## 2020-06-15 DIAGNOSIS — Z880 Allergy status to penicillin: Secondary | ICD-10-CM | POA: Diagnosis not present

## 2020-06-15 DIAGNOSIS — Z885 Allergy status to narcotic agent status: Secondary | ICD-10-CM | POA: Diagnosis not present

## 2020-06-15 DIAGNOSIS — E7800 Pure hypercholesterolemia, unspecified: Secondary | ICD-10-CM | POA: Diagnosis not present

## 2020-06-15 DIAGNOSIS — Z8249 Family history of ischemic heart disease and other diseases of the circulatory system: Secondary | ICD-10-CM | POA: Diagnosis not present

## 2020-06-15 DIAGNOSIS — R1110 Vomiting, unspecified: Secondary | ICD-10-CM | POA: Diagnosis present

## 2020-06-15 DIAGNOSIS — E876 Hypokalemia: Secondary | ICD-10-CM | POA: Diagnosis not present

## 2020-06-15 DIAGNOSIS — Z79899 Other long term (current) drug therapy: Secondary | ICD-10-CM | POA: Diagnosis not present

## 2020-06-15 DIAGNOSIS — Z87891 Personal history of nicotine dependence: Secondary | ICD-10-CM | POA: Diagnosis not present

## 2020-06-15 LAB — CBC
HCT: 36 % (ref 36.0–46.0)
Hemoglobin: 12.4 g/dL (ref 12.0–15.0)
MCH: 30.5 pg (ref 26.0–34.0)
MCHC: 34.4 g/dL (ref 30.0–36.0)
MCV: 88.7 fL (ref 80.0–100.0)
Platelets: 219 10*3/uL (ref 150–400)
RBC: 4.06 MIL/uL (ref 3.87–5.11)
RDW: 12.3 % (ref 11.5–15.5)
WBC: 11.5 10*3/uL — ABNORMAL HIGH (ref 4.0–10.5)
nRBC: 0 % (ref 0.0–0.2)

## 2020-06-15 LAB — RESPIRATORY PANEL BY RT PCR (FLU A&B, COVID)
Influenza A by PCR: NEGATIVE
Influenza B by PCR: NEGATIVE
SARS Coronavirus 2 by RT PCR: NEGATIVE

## 2020-06-15 LAB — HIV ANTIBODY (ROUTINE TESTING W REFLEX): HIV Screen 4th Generation wRfx: NONREACTIVE

## 2020-06-15 LAB — CREATININE, SERUM
Creatinine, Ser: 0.5 mg/dL (ref 0.44–1.00)
GFR, Estimated: >60 mL/min (ref >60)

## 2020-06-15 LAB — MAGNESIUM: Magnesium: 1.9 mg/dL (ref 1.7–2.4)

## 2020-06-15 MED ORDER — METOCLOPRAMIDE HCL 5 MG/ML IJ SOLN
5.0000 mg | Freq: Three times a day (TID) | INTRAMUSCULAR | Status: DC
  Administered 2020-06-15 – 2020-06-16 (×4): 5 mg via INTRAVENOUS
  Filled 2020-06-15 (×4): qty 2

## 2020-06-15 MED ORDER — METOCLOPRAMIDE HCL 5 MG/ML IJ SOLN
10.0000 mg | Freq: Once | INTRAMUSCULAR | Status: AC
  Administered 2020-06-15: 10 mg via INTRAVENOUS
  Filled 2020-06-15: qty 2

## 2020-06-15 MED ORDER — SODIUM CHLORIDE 0.9 % IV SOLN: INTRAVENOUS | Status: DC

## 2020-06-15 MED ORDER — ACETAMINOPHEN 650 MG RE SUPP: 650.0000 mg | Freq: Four times a day (QID) | RECTAL | Status: DC | PRN

## 2020-06-15 MED ORDER — IRBESARTAN 150 MG PO TABS
75.0000 mg | ORAL_TABLET | Freq: Every day | ORAL | Status: DC
  Administered 2020-06-15 – 2020-06-16 (×2): 75 mg via ORAL
  Filled 2020-06-15 (×3): qty 1

## 2020-06-15 MED ORDER — ONDANSETRON HCL 4 MG/2ML IJ SOLN
4.0000 mg | INTRAMUSCULAR | Status: DC | PRN
  Administered 2020-06-15 – 2020-06-16 (×2): 4 mg via INTRAVENOUS
  Filled 2020-06-15 (×2): qty 2

## 2020-06-15 MED ORDER — PANTOPRAZOLE SODIUM 40 MG IV SOLR
40.0000 mg | Freq: Every day | INTRAVENOUS | Status: DC
  Administered 2020-06-15 – 2020-06-16 (×2): 40 mg via INTRAVENOUS
  Filled 2020-06-15 (×2): qty 40

## 2020-06-15 MED ORDER — POTASSIUM CHLORIDE 10 MEQ/100ML IV SOLN
10.0000 meq | INTRAVENOUS | Status: AC
  Administered 2020-06-15 (×4): 10 meq via INTRAVENOUS
  Filled 2020-06-15 (×3): qty 100

## 2020-06-15 MED ORDER — SODIUM CHLORIDE 0.9 % IV BOLUS
1000.0000 mL | Freq: Once | INTRAVENOUS | Status: AC
  Administered 2020-06-15: 1000 mL via INTRAVENOUS

## 2020-06-15 MED ORDER — ONDANSETRON HCL 4 MG/2ML IJ SOLN
4.0000 mg | Freq: Four times a day (QID) | INTRAMUSCULAR | Status: DC | PRN
  Administered 2020-06-15: 4 mg via INTRAVENOUS
  Filled 2020-06-15: qty 2

## 2020-06-15 MED ORDER — MORPHINE SULFATE (PF) 2 MG/ML IV SOLN: 2.0000 mg | INTRAVENOUS | Status: DC | PRN

## 2020-06-15 MED ORDER — ACETAMINOPHEN 325 MG PO TABS
650.0000 mg | ORAL_TABLET | Freq: Four times a day (QID) | ORAL | Status: DC | PRN
  Administered 2020-06-15: 15:00:00 650 mg via ORAL
  Filled 2020-06-15: qty 2

## 2020-06-15 MED ORDER — LEVOTHYROXINE SODIUM 50 MCG PO TABS
175.0000 ug | ORAL_TABLET | Freq: Every day | ORAL | Status: DC
  Administered 2020-06-16: 175 ug via ORAL
  Filled 2020-06-15: qty 1

## 2020-06-15 MED ORDER — ENOXAPARIN SODIUM 40 MG/0.4ML ~~LOC~~ SOLN
40.0000 mg | SUBCUTANEOUS | Status: DC
  Administered 2020-06-15 – 2020-06-16 (×2): 40 mg via SUBCUTANEOUS
  Filled 2020-06-15 (×2): qty 0.4

## 2020-06-15 MED ORDER — LABETALOL HCL 5 MG/ML IV SOLN
5.0000 mg | Freq: Four times a day (QID) | INTRAVENOUS | Status: DC | PRN
  Filled 2020-06-15: qty 4

## 2020-06-15 NOTE — ED Notes (Addendum)
Pt able to tolerate clear liquids and jello at this time. Pt denies any n/v. Will continue to monitor.

## 2020-06-15 NOTE — Progress Notes (Signed)
Patient arrived to unit in stable condition. 

## 2020-06-15 NOTE — Progress Notes (Signed)
No charge progress note.  [name] [name] is a 68 y.o. female with medical history significant for hypertension and hypothyroidism who presents to the emergency room for the second time in 72 hours with intractable nausea and vomiting.  On her first visit she was treated and discharged but returns still unable to hold anything down.  Patient did had 1 more episode of emesis.  Improved with starting her on scheduled Reglan along with Zofran for breakthrough nausea and vomiting. Nausea improved when seen during morning rounds.  Willing to try diet. Start her on clear liquid which can be advanced as tolerated.  Continue with current management.

## 2020-06-15 NOTE — ED Notes (Signed)
Pt states she feels nauseous at this time - PRN Zofran 4mg  IV given - will continue to monitor.

## 2020-06-15 NOTE — ED Notes (Signed)
Pt states, "I feel better", Reglan 5mg  IV effective, will continue to monitor.

## 2020-06-15 NOTE — ED Notes (Signed)
Reglan 5mg  IV given at this time - will continue to monitor.

## 2020-06-15 NOTE — H&P (Signed)
History and Physical    [name] [name]:[id number] DOB: 1951/04/10 DOA: 06/15/2020  PCP: Einar Pheasant, MD   Patient coming from: Home  I have personally briefly reviewed patient's old medical records in [place]  Chief Complaint: Vomiting  HPI: [name] [name] is a 69 y.o. female with medical history significant for hypertension and hypothyroidism who presents to the emergency room for the second time in 72 hours with intractable nausea and vomiting.  On her first visit she was treated and discharged but returns still unable to hold anything down.  She denies fever or chills.  Has mild epigastric discomfort only when she vomits, nonradiating with no aggravating or alleviating factor.  Denies fever or chills.  No change in bowel habits, denies dysuria ED Course: Vitals in the emergency room unremarkable.  Blood work also unremarkable except for mildly decreased potassium of 3.3.  Lipase within normal limits.  Urinalysis normal.  No leukocytosis.  CT abdomen and pelvis with no explanation for patient's symptoms Patient treated with different antiemetics but continues to vomit with oral challenge.  Hospitalist consulted for admission.  Review of Systems: As per HPI otherwise all other systems on review of systems negative.    Past Medical History:  Diagnosis Date  . Adenomatous polyp 2005  . Arrhythmia    H/O  . Basal cell carcinoma   . CTS (carpal tunnel syndrome)   . Environmental allergies   . History of chicken pox   . HTN (hypertension)   . Hypercholesterolemia   . Hypothyroidism     Past Surgical History:  Procedure Laterality Date  . BASAL CELL CARCINOMA EXCISION     right hip,abd, right arm, back neck  . BREAST CYST ASPIRATION Left    negative  . CTS releast Rt wrist    . lipoma removal     Rt shoulder   . MELANOMA EXCISION     right buttocks, top of scalp  . POLYPECTOMY     + TA  . TUBAL LIGATION  1982     reports that she quit smoking about 39 years  ago. Her smoking use included cigarettes. She has never used smokeless tobacco. She reports current alcohol use of about 7.0 standard drinks of alcohol per week. She reports that she does not use drugs.  Allergies  Allergen Reactions  . Codeine     NAUSEA  . Penicillins Hives    Family History  Problem Relation Age of Onset  . Cervical cancer Mother   . Arthritis Mother   . Hypertension Mother   . Sudden death Mother        cerbral hemorrage  . Heart disease Father   . Cancer Father        lung cancer  . Diabetes Maternal Grandmother   . Breast cancer Paternal Aunt 74  . Colon cancer Neg Hx       Prior to Admission medications   Medication Sig Start Date End Date Taking? Authorizing Provider  acyclovir ointment (ZOVIRAX) 5 % Apply to affected area on nose bid 10/23/19   Einar Pheasant, MD  ammonium lactate (GERI-HYDROLAC 5) 5 % LOTN lotion Apply 1 application topically 2 (two) times daily.    [provider]  azelastine (ASTELIN) 0.1 % nasal spray USE ONE SPRAY INTO EACH NOSTRIL TWICE A DAY 11/20/19   Einar Pheasant, MD  cetirizine (ZYRTEC) 10 MG tablet Take 10 mg by mouth daily.    [provider]  cholecalciferol (VITAMIN D) 1000  units tablet Take 1,000 Units by mouth daily.    [provider]  clobetasol (TEMOVATE) 0.05 % external solution Apply 1 application topically daily.    [provider]  Eszopiclone 3 MG TABS TAKE ONE TABLET BY MOUTH IMMEDIATELY BEFORE BEDTIME 04/17/20   Einar Pheasant, MD  famotidine (PEPCID) 20 MG tablet Take 20 mg by mouth daily.    [provider]  fluticasone (FLONASE) 50 MCG/ACT nasal spray USE 2 SPRAYS IN EACH NOSTRIL EVERY DAY 03/31/17   Einar Pheasant, MD  hydrocortisone 2.5 % ointment  06/04/18   [provider]  irbesartan (AVAPRO) 75 MG tablet TAKE ONE TABLET BY MOUTH EVERY DAY 03/17/20   Einar Pheasant, MD  Multiple Vitamins-Minerals (HAIR SKIN AND NAILS FORMULA PO) Take 1 tablet by mouth  daily. 05/25/18   [provider]  mupirocin ointment (BACTROBAN) 2 % Apply to affected area bid 07/24/18   Einar Pheasant, MD  Nutritional Supplements (NUTRITIONAL SUPPLEMENT PO) Take by mouth. DoTERRA: ALPHA CRS+, xEO MEGA, MICROPLEX MVp, ON GUARD (protective blend)    [provider]  ondansetron (ZOFRAN ODT) 4 MG disintegrating tablet Take 1 tablet (4 mg total) by mouth every 8 (eight) hours as needed for nausea or vomiting. 06/14/20   Naaman Plummer, MD  Probiotic Product (PROBIOTIC-10 PO) Take by mouth.    [provider]  rosuvastatin (CRESTOR) 5 MG tablet TAKE ONE TABLET EVERY DAY 06/02/20   Einar Pheasant, MD  SYNTHROID 175 MCG tablet TAKE 1 TABLET EVERY DAY ON EMPTY STOMACHWITH A GLASS OF WATER AT LEAST 30-60 MINBEFORE BREAKFAST 04/28/20   Einar Pheasant, MD  UNABLE TO FIND Take by mouth 3 (three) times daily. Collegen supplements    [provider]    Physical Exam: Vitals:   06/14/20 2006 06/14/20 2008 06/15/20 0009  BP:  (!) 162/84 (!) 153/69  Pulse:  62 65  Resp:  18 17  Temp:  98.3 F (36.8 C) 98.3 F (36.8 C)  TempSrc:  Oral   SpO2:  100% 97%  Weight: 62.6 kg    Height: 5\' 3"  (1.6 m)       Vitals:   06/14/20 2006 06/14/20 2008 06/15/20 0009  BP:  (!) 162/84 (!) 153/69  Pulse:  62 65  Resp:  18 17  Temp:  98.3 F (36.8 C) 98.3 F (36.8 C)  TempSrc:  Oral   SpO2:  100% 97%  Weight: 62.6 kg    Height: 5\' 3"  (1.6 m)        Constitutional:  Appears unwell, oriented x 3 . Not in any apparent distress HEENT:      Head: Normocephalic and atraumatic.         Eyes: PERLA, EOMI, Conjunctivae are normal. Sclera is non-icteric.       Mouth/Throat: Mucous membranes are moist.       Neck: Supple with no signs of meningismus. Cardiovascular: Regular rate and rhythm. No murmurs, gallops, or rubs. 2+ symmetrical distal pulses are present . No JVD. No LE edema Respiratory: Respiratory effort normal .Lungs sounds clear bilaterally.  No wheezes, crackles, or rhonchi.  Gastrointestinal: Soft, non tender, and non distended with positive bowel sounds. No rebound or guarding. Genitourinary: No CVA tenderness. Musculoskeletal: Nontender with normal range of motion in all extremities. No cyanosis, or erythema of extremities. Neurologic:  Face is symmetric. Moving all extremities. No gross focal neurologic deficits . Skin: Skin is warm, dry.  No rash or ulcers Psychiatric: Mood and affect are normal  Labs on Admission: I have personally reviewed following labs and imaging studies  CBC: Recent Labs  Lab 06/13/20 2317 06/14/20 2015  WBC 11.1* 10.4  NEUTROABS  --  8.0*  HGB 12.8 12.7  HCT 36.9 36.9  MCV 87.2 87.0  PLT 227 937   Basic Metabolic Panel: Recent Labs  Lab 06/13/20 2317 06/14/20 2015  NA 136 135  K 3.8 3.3*  CL 100 101  CO2 24 22  GLUCOSE 139* 116*  BUN 15 12  CREATININE 0.47 0.54  CALCIUM 9.3 9.0   GFR: Estimated Creatinine Clearance: 54.1 mL/min (by C-G formula based on SCr of 0.54 mg/dL). Liver Function Tests: Recent Labs  Lab 06/13/20 2317 06/14/20 2015  AST 26 30  ALT 46* 48*  ALKPHOS 84 82  BILITOT 1.0 1.1  PROT 7.0 6.7  ALBUMIN 4.6 4.2   Recent Labs  Lab 06/13/20 2317 06/14/20 2015  LIPASE 23 24   No results for input(s): AMMONIA in the last 168 hours. Coagulation Profile: No results for input(s): INR, PROTIME in the last 168 hours. Cardiac Enzymes: No results for input(s): CKTOTAL, CKMB, CKMBINDEX, TROPONINI in the last 168 hours. BNP (last 3 results) No results for input(s): PROBNP in the last 8760 hours. HbA1C: No results for input(s): HGBA1C in the last 72 hours. CBG: No results for input(s): GLUCAP in the last 168 hours. Lipid Profile: No results for input(s): CHOL, HDL, LDLCALC, TRIG, CHOLHDL, LDLDIRECT in the last 72 hours. Thyroid Function Tests: No results for input(s): TSH, T4TOTAL, FREET4, T3FREE, THYROIDAB in the last 72 hours. Anemia Panel: No results  for input(s): VITAMINB12, FOLATE, FERRITIN, TIBC, IRON, RETICCTPCT in the last 72 hours. Urine analysis:    Component Value Date/Time   COLORURINE STRAW (A) 06/13/2020 0234   APPEARANCEUR CLEAR (A) 06/13/2020 0234   LABSPEC 1.026 06/13/2020 0234   PHURINE 7.0 06/13/2020 0234   GLUCOSEU NEGATIVE 06/13/2020 0234   HGBUR NEGATIVE 06/13/2020 0234   BILIRUBINUR NEGATIVE 06/13/2020 0234   BILIRUBINUR negative 06/05/2019 1046   KETONESUR 20 (A) 06/13/2020 0234   PROTEINUR NEGATIVE 06/13/2020 0234   UROBILINOGEN negative (A) 06/05/2019 1046   NITRITE NEGATIVE 06/13/2020 0234   LEUKOCYTESUR NEGATIVE 06/13/2020 0234    Radiological Exams on Admission: CT Abdomen Pelvis W Contrast  Result Date: 06/14/2020 CLINICAL DATA:  Nausea, vomiting EXAM: CT ABDOMEN AND PELVIS WITH CONTRAST TECHNIQUE: Multidetector CT imaging of the abdomen and pelvis was performed using the standard protocol following bolus administration of intravenous contrast. CONTRAST:  163mL OMNIPAQUE IOHEXOL 300 MG/ML  SOLN COMPARISON:  08/24/2006 FINDINGS: Lower chest: The visualized lung bases are clear bilaterally. The visualized heart and pericardium are unremarkable. Small hiatal hernia. Hepatobiliary: Tiny cyst within the right hepatic lobe. Focal fatty hepatic infiltration adjacent the falciform ligament. Liver otherwise unremarkable. Gallbladder unremarkable. No intra or extrahepatic biliary ductal dilation. Pancreas: Stable 3 mm cyst within the mid body and tail of the pancreas. The pancreas is otherwise unremarkable. Spleen: Unremarkable Adrenals/Urinary Tract: Adrenal glands are unremarkable. Kidneys are normal, without renal calculi, focal lesion, or hydronephrosis. Bladder is unremarkable. Stomach/Bowel: Stomach, small bowel, and large bowel are unremarkable. Appendix normal. No free intraperitoneal gas or fluid. Vascular/Lymphatic: Mild atherosclerotic calcification within the abdominal aorta. No aortic aneurysm. No pathologic  adenopathy within the abdomen and pelvis. Reproductive: Uterus and bilateral adnexa are unremarkable. Other: Rectum unremarkable Musculoskeletal: Degenerative changes are noted within the lumbar spine. No lytic or blastic bone lesions. 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 IMPRESSION: No radiographic explanation for the patient's reported symptoms. Incidental findings as noted above. Aortic Atherosclerosis (ICD10-I70.0). Electronically Signed   By: Fidela Salisbury MD   On: 06/14/2020 02:12     Assessment/Plan 69 year old female with history of hypertension and hypothyroidism who presents to the emergency room for the second time in 72 hours with intractable nausea and vomiting.  Work-up negative    Intractable vomiting with nausea, suspect gastritis -IV hydration -IV antiemetics, IV Protonix -We will keep n.p.o. except for ice chips  Essential hypertension -IV labetalol as needed BP over 160/90 while n.p.o.  Hypothyroidism -Can hold levothyroxine while n.p.o. or take with small sips     DVT prophylaxis: Lovenox  Code Status: full code  Family Communication:  none  Disposition Plan: Back to previous home environment Consults called: none  Status: Observation    Athena Masse MD [facility]     06/15/2020, 5:11 AM

## 2020-06-15 NOTE — ED Notes (Signed)
Pt states she is still experiencing nausea at this time, MD [facility] aware, awaiting new orders.

## 2020-06-15 NOTE — ED Notes (Signed)
No n/v noticed - pt sitting upright on gurney - NAD noted - pt transferred to 1A - room 118 via wheel-chair at this time. Pt states she has all her personal belongings with her at this time.

## 2020-06-16 ENCOUNTER — Telehealth: Payer: Self-pay | Admitting: Internal Medicine

## 2020-06-16 DIAGNOSIS — I10 Essential (primary) hypertension: Secondary | ICD-10-CM

## 2020-06-16 DIAGNOSIS — E039 Hypothyroidism, unspecified: Secondary | ICD-10-CM

## 2020-06-16 LAB — POTASSIUM: Potassium: 3.3 mmol/L — ABNORMAL LOW (ref 3.5–5.1)

## 2020-06-16 MED ORDER — OMEPRAZOLE 20 MG PO CPDR: 20.0000 mg | DELAYED_RELEASE_CAPSULE | Freq: Every day | ORAL | 0 refills | Status: DC

## 2020-06-16 MED ORDER — METOCLOPRAMIDE HCL 5 MG PO TABS
5.0000 mg | ORAL_TABLET | Freq: Three times a day (TID) | ORAL | Status: DC
  Administered 2020-06-16: 12:00:00 5 mg via ORAL
  Filled 2020-06-16: qty 1

## 2020-06-16 MED ORDER — POTASSIUM CHLORIDE CRYS ER 20 MEQ PO TBCR
40.0000 meq | EXTENDED_RELEASE_TABLET | Freq: Once | ORAL | Status: AC
  Administered 2020-06-16: 09:00:00 40 meq via ORAL
  Filled 2020-06-16: qty 2

## 2020-06-16 MED ORDER — METOCLOPRAMIDE HCL 5 MG PO TABS
5.0000 mg | ORAL_TABLET | Freq: Three times a day (TID) | ORAL | 0 refills | Status: DC
Start: 2020-06-16 — End: 2022-09-13

## 2020-06-16 NOTE — Telephone Encounter (Signed)
Patient is being released from the hospital and needs a hospital follow up in the next week to week and a half. Where should we schedule patient?

## 2020-06-16 NOTE — Progress Notes (Signed)
Patient given discharge instructions. Verbalized understanding of all instructions, including follow up appointment. Patient OOF in stable condition.

## 2020-06-16 NOTE — Discharge Summary (Signed)
Physician Discharge Summary  [name] [name] UDJ:[id number] DOB: 05/13/1951 DOA: 06/15/2020  PCP: Einar Pheasant, MD  Admit date: 06/15/2020 Discharge date: 06/16/2020  Admitted From: Home Disposition: Home  Recommendations for Outpatient Follow-up:  1. Follow up with PCP in 1-2 weeks 2. Please obtain BMP/CBC in one week 3. Please follow up on the following pending results: None  Home Health: No  Equipment/Devices: None Discharge Condition: Stable CODE STATUS: Full Diet recommendation: Heart Healthy   Brief/Interim Summary: [name] [name] medical history significant forhypertension and hypothyroidism who presents to the emergency room for the second time in 72 hours with intractable nausea and vomiting. On her first visit she was treated and discharged but returns still unable to hold anything down.  Improved quickly with scheduled Reglan.  Able to tolerate soft diet without any difficulty.  Nausea and vomiting resolved.  No abdominal pain before discharge.  She was discharged home with Reglan and instructions to continue taking before meals for the next couple of days followed by as needed only. Patient had hypokalemia on presentation secondary to GI losses which was repleted before discharge. Patient has hypertension and might need work-up for adrenal insufficiency due to persistent mild hypokalemia with hypertension.  She will continue with rest of her home meds and follow-up with her primary care provider for further management.  Discharge Diagnoses:  Active Problems:   Hypothyroidism   Essential hypertension   Intractable vomiting with nausea   Intractable vomiting   Intractable nausea and vomiting   Discharge Instructions  Discharge Instructions    Diet - low sodium heart healthy   Complete by: As directed    Discharge instructions   Complete by: As directed    It was pleasure taking care of you. Please take Reglan before meals for next couple  of days and then only as needed. You can take Zofran if needed for breakthrough vomiting. Please follow-up with your primary care provider.   Increase activity slowly   Complete by: As directed      Allergies as of 06/16/2020      Reactions   Codeine    NAUSEA   Penicillins Hives      Medication List    TAKE these medications   cetirizine 10 MG tablet Commonly known as: ZYRTEC Take 10 mg by mouth daily.   cholecalciferol 1000 units tablet Commonly known as: VITAMIN D Take 1,000 Units by mouth daily.   Eszopiclone 3 MG Tabs TAKE ONE TABLET BY MOUTH IMMEDIATELY BEFORE BEDTIME What changed: See the new instructions.   famotidine 20 MG tablet Commonly known as: PEPCID Take 20 mg by mouth daily.   HAIR SKIN AND NAILS FORMULA PO Take 1 tablet by mouth daily.   irbesartan 75 MG tablet Commonly known as: AVAPRO TAKE ONE TABLET BY MOUTH EVERY DAY   metoCLOPramide 5 MG tablet Commonly known as: REGLAN Take 1 tablet (5 mg total) by mouth 3 (three) times daily before meals.   NUTRITIONAL SUPPLEMENT PO Take by mouth. DoTERRA: ALPHA CRS+, xEO MEGA, MICROPLEX MVp, ON GUARD (protective blend)   omeprazole 20 MG capsule Commonly known as: PRILOSEC Take 1 capsule (20 mg total) by mouth daily.   ondansetron 4 MG disintegrating tablet Commonly known as: Zofran ODT Take 1 tablet (4 mg total) by mouth every 8 (eight) hours as needed for nausea or vomiting.   rosuvastatin 5 MG tablet Commonly known as: CRESTOR TAKE ONE TABLET EVERY DAY   Synthroid 175 MCG tablet Generic drug:  levothyroxine TAKE 1 TABLET EVERY DAY ON EMPTY STOMACHWITH A GLASS OF WATER AT LEAST 30-60 MINBEFORE BREAKFAST What changed: See the new instructions.       Follow-up Information    Einar Pheasant, MD. Schedule an appointment as soon as possible for a visit in 1 week(s).   Specialty: Internal Medicine Contact information: [address] [place]  [id number] [phone]              Allergies  Allergen Reactions  . Codeine     NAUSEA  . Penicillins Hives    Consultations:  None  Procedures/Studies: CT Abdomen Pelvis W Contrast  Result Date: 06/14/2020 CLINICAL DATA:  Nausea, vomiting EXAM: CT ABDOMEN AND PELVIS WITH CONTRAST TECHNIQUE: Multidetector CT imaging of the abdomen and pelvis was performed using the standard protocol following bolus administration of intravenous contrast. CONTRAST:  166mL OMNIPAQUE IOHEXOL 300 MG/ML  SOLN COMPARISON:  08/24/2006 FINDINGS: Lower chest: The visualized lung bases are clear bilaterally. The visualized heart and pericardium are unremarkable. Small hiatal hernia. Hepatobiliary: Tiny cyst within the right hepatic lobe. Focal fatty hepatic infiltration adjacent the falciform ligament. Liver otherwise unremarkable. Gallbladder unremarkable. No intra or extrahepatic biliary ductal dilation. Pancreas: Stable 3 mm cyst within the mid body and tail of the pancreas. The pancreas is otherwise unremarkable. Spleen: Unremarkable Adrenals/Urinary Tract: Adrenal glands are unremarkable. Kidneys are normal, without renal calculi, focal lesion, or hydronephrosis. Bladder is unremarkable. Stomach/Bowel: Stomach, small bowel, and large bowel are unremarkable. Appendix normal. No free intraperitoneal gas or fluid. Vascular/Lymphatic: Mild atherosclerotic calcification within the abdominal aorta. No aortic aneurysm. No pathologic adenopathy within the abdomen and pelvis. Reproductive: Uterus and bilateral adnexa are unremarkable. Other: Rectum unremarkable Musculoskeletal: Degenerative changes are noted within the lumbar spine. No lytic or blastic bone lesions. 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 IMPRESSION: No radiographic explanation for the patient's reported symptoms. Incidental findings as  noted above. Aortic Atherosclerosis (ICD10-I70.0). Electronically Signed   By: Fidela Salisbury MD   On: 06/14/2020 02:12    Subjective: Patient was seen and examined during morning rounds today.  Feeling better, abdominal pain, nausea and vomiting has been resolved.  She was able to tolerate full liquid without any difficulty.  She wants to try lunch before leaving.  Discharge Exam: Vitals:   06/16/20 0824 06/16/20 1143  BP: (!) 155/70 (!) 174/62  Pulse: (!) 57 61  Resp: 17 12  Temp: 98.5 F (36.9 C) 98.1 F (36.7 C)  SpO2: 97% 99%   Vitals:   06/16/20 0019 06/16/20 0424 06/16/20 0824 06/16/20 1143  BP: (!) 158/70 (!) 173/80 (!) 155/70 (!) 174/62  Pulse: (!) 56 68 (!) 57 61  Resp: 16 16 17 12   Temp: 98.5 F (36.9 C) 98.2 F (36.8 C) 98.5 F (36.9 C) 98.1 F (36.7 C)  TempSrc:    Oral  SpO2: 99% 98% 97% 99%  Weight:      Height:        General: Pt is alert, awake, not in acute distress Cardiovascular: RRR, S1/S2 +, no rubs, no gallops Respiratory: CTA bilaterally, no wheezing, no rhonchi Abdominal: Soft, NT, ND, bowel sounds + Extremities: no edema, no cyanosis   The results of significant diagnostics from this hospitalization (including imaging, microbiology, ancillary and laboratory) are listed below for reference.    Microbiology: Recent Results (from the past 240 hour(s))  Respiratory Panel by RT PCR (Flu  A&B, Covid) - Nasopharyngeal Swab     Status: None   Collection Time: 06/15/20  5:29 AM   Specimen: Nasopharyngeal Swab  Result Value Ref Range Status   SARS Coronavirus 2 by RT PCR NEGATIVE NEGATIVE Final    Comment: (NOTE) SARS-CoV-2 target nucleic acids are NOT DETECTED.  The SARS-CoV-2 RNA is generally detectable in upper respiratoy specimens during the acute phase of infection. The lowest concentration of SARS-CoV-2 viral copies this assay can detect is 131 copies/mL. A negative result does not preclude SARS-Cov-2 infection and should not be used as the  sole basis for treatment or other patient management decisions. A negative result may occur with  improper specimen collection/handling, submission of specimen other than nasopharyngeal swab, presence of viral mutation(s) within the areas targeted by this assay, and inadequate number of viral copies (<131 copies/mL). A negative result must be combined with clinical observations, patient history, and epidemiological information. The expected result is Negative.  Fact Sheet for Patients:  PinkCheek.be  Fact Sheet for Healthcare Providers:  GravelBags.it  This test is no t yet approved or cleared by the [place] FDA and  has been authorized for detection and/or diagnosis of SARS-CoV-2 by FDA under an Emergency Use Authorization (EUA). This EUA will remain  in effect (meaning this test can be used) for the duration of the COVID-19 declaration under Section 564(b)(1) of the Act, 21 U.S.C. section 360bbb-3(b)(1), unless the authorization is terminated or revoked sooner.     Influenza A by PCR NEGATIVE NEGATIVE Final   Influenza B by PCR NEGATIVE NEGATIVE Final    Comment: (NOTE) The Xpert Xpress SARS-CoV-2/FLU/RSV assay is intended as an aid in  the diagnosis of influenza from Nasopharyngeal swab specimens and  should not be used as a sole basis for treatment. Nasal washings and  aspirates are unacceptable for Xpert Xpress SARS-CoV-2/FLU/RSV  testing.  Fact Sheet for Patients: PinkCheek.be  Fact Sheet for Healthcare Providers: GravelBags.it  This test is not yet approved or cleared by the [place] FDA and  has been authorized for detection and/or diagnosis of SARS-CoV-2 by  FDA under an Emergency Use Authorization (EUA). This EUA will remain  in effect (meaning this test can be used) for the duration of the  Covid-19 declaration under Section 564(b)(1) of  the Act, 21  U.S.C. section 360bbb-3(b)(1), unless the authorization is  terminated or revoked. Performed at [facility] [place], [address] [place], [place] [address]      Labs: BNP (last 3 results) No results for input(s): BNP in the last 8760 hours. Basic Metabolic Panel: Recent Labs  Lab 06/13/20 2317 06/14/20 2015 06/15/20 0529 06/16/20 0515  NA 136 135  --   --   K 3.8 3.3*  --  3.3*  CL 100 101  --   --   CO2 24 22  --   --   GLUCOSE 139* 116*  --   --   BUN 15 12  --   --   CREATININE 0.47 0.54 0.50  --   CALCIUM 9.3 9.0  --   --   MG  --   --  1.9  --    Liver Function Tests: Recent Labs  Lab 06/13/20 2317 06/14/20 2015  AST 26 30  ALT 46* 48*  ALKPHOS 84 82  BILITOT 1.0 1.1  PROT 7.0 6.7  ALBUMIN 4.6 4.2   Recent Labs  Lab 06/13/20 2317 06/14/20 2015  LIPASE 23 24   No results for  input(s): AMMONIA in the last 168 hours. CBC: Recent Labs  Lab 06/13/20 2317 06/14/20 2015 06/15/20 0529  WBC 11.1* 10.4 11.5*  NEUTROABS  --  8.0*  --   HGB 12.8 12.7 12.4  HCT 36.9 36.9 36.0  MCV 87.2 87.0 88.7  PLT 227 230 219   Cardiac Enzymes: No results for input(s): CKTOTAL, CKMB, CKMBINDEX, TROPONINI in the last 168 hours. BNP: Invalid input(s): POCBNP CBG: No results for input(s): GLUCAP in the last 168 hours. D-Dimer No results for input(s): DDIMER in the last 72 hours. Hgb A1c No results for input(s): HGBA1C in the last 72 hours. Lipid Profile No results for input(s): CHOL, HDL, LDLCALC, TRIG, CHOLHDL, LDLDIRECT in the last 72 hours. Thyroid function studies No results for input(s): TSH, T4TOTAL, T3FREE, THYROIDAB in the last 72 hours.  Invalid input(s): FREET3 Anemia work up No results for input(s): VITAMINB12, FOLATE, FERRITIN, TIBC, IRON, RETICCTPCT in the last 72 hours. Urinalysis    Component Value Date/Time   COLORURINE STRAW (A) 06/13/2020 0234   APPEARANCEUR CLEAR (A) 06/13/2020 0234   LABSPEC 1.026 06/13/2020 0234    PHURINE 7.0 06/13/2020 0234   GLUCOSEU NEGATIVE 06/13/2020 0234   HGBUR NEGATIVE 06/13/2020 0234   BILIRUBINUR NEGATIVE 06/13/2020 0234   BILIRUBINUR negative 06/05/2019 1046   KETONESUR 20 (A) 06/13/2020 0234   PROTEINUR NEGATIVE 06/13/2020 0234   UROBILINOGEN negative (A) 06/05/2019 1046   NITRITE NEGATIVE 06/13/2020 0234   LEUKOCYTESUR NEGATIVE 06/13/2020 0234   Sepsis Labs Invalid input(s): PROCALCITONIN,  WBC,  LACTICIDVEN Microbiology Recent Results (from the past 240 hour(s))  Respiratory Panel by RT PCR (Flu A&B, Covid) - Nasopharyngeal Swab     Status: None   Collection Time: 06/15/20  5:29 AM   Specimen: Nasopharyngeal Swab  Result Value Ref Range Status   SARS Coronavirus 2 by RT PCR NEGATIVE NEGATIVE Final    Comment: (NOTE) SARS-CoV-2 target nucleic acids are NOT DETECTED.  The SARS-CoV-2 RNA is generally detectable in upper respiratoy specimens during the acute phase of infection. The lowest concentration of SARS-CoV-2 viral copies this assay can detect is 131 copies/mL. A negative result does not preclude SARS-Cov-2 infection and should not be used as the sole basis for treatment or other patient management decisions. A negative result may occur with  improper specimen collection/handling, submission of specimen other than nasopharyngeal swab, presence of viral mutation(s) within the areas targeted by this assay, and inadequate number of viral copies (<131 copies/mL). A negative result must be combined with clinical observations, patient history, and epidemiological information. The expected result is Negative.  Fact Sheet for Patients:  PinkCheek.be  Fact Sheet for Healthcare Providers:  GravelBags.it  This test is no t yet approved or cleared by the [place] FDA and  has been authorized for detection and/or diagnosis of SARS-CoV-2 by FDA under an Emergency Use Authorization (EUA). This EUA will  remain  in effect (meaning this test can be used) for the duration of the COVID-19 declaration under Section 564(b)(1) of the Act, 21 U.S.C. section 360bbb-3(b)(1), unless the authorization is terminated or revoked sooner.     Influenza A by PCR NEGATIVE NEGATIVE Final   Influenza B by PCR NEGATIVE NEGATIVE Final    Comment: (NOTE) The Xpert Xpress SARS-CoV-2/FLU/RSV assay is intended as an aid in  the diagnosis of influenza from Nasopharyngeal swab specimens and  should not be used as a sole basis for treatment. Nasal washings and  aspirates are unacceptable for Xpert Xpress SARS-CoV-2/FLU/RSV  testing.  Fact Sheet for Patients: PinkCheek.be  Fact Sheet for Healthcare Providers: GravelBags.it  This test is not yet approved or cleared by the [place] FDA and  has been authorized for detection and/or diagnosis of SARS-CoV-2 by  FDA under an Emergency Use Authorization (EUA). This EUA will remain  in effect (meaning this test can be used) for the duration of the  Covid-19 declaration under Section 564(b)(1) of the Act, 21  U.S.C. section 360bbb-3(b)(1), unless the authorization is  terminated or revoked. Performed at [facility], [address] [place], [place] [address]     Time coordinating discharge: Over 30 minutes  SIGNED:  Lorella Nimrod, MD  [facility] 06/16/2020, 1:30 PM  If 7PM-7AM, please contact night-coverage [email or web address]  This record has been created using Systems analyst. Errors have been sought and corrected,but may not always be located. Such creation errors do not reflect on the standard of care.

## 2020-06-17 ENCOUNTER — Encounter: Payer: Self-pay | Admitting: Internal Medicine

## 2020-06-17 NOTE — Telephone Encounter (Signed)
She has an appt on 11/22 already scheduled. Does she qualify for TCM? If so and needs sooner appt, ok to use a 12:00 if no other availability.

## 2020-06-18 NOTE — Telephone Encounter (Signed)
Sooner appointment offered 06/25/20 and 06/29/20 at 12:00. Declined. Plans to keep scheduled appointment and come in for fasting labs on 07/03/20 per previous conversation. Okay to call back and offer appointment if something else opens up.

## 2020-06-18 NOTE — Telephone Encounter (Signed)
I just wanted to make sure I understand, she did not want to come in for earlier appt.  If needs earlier appt and times offered weren't good, I can work her in somewhere else if needed.  Just let me know if I need to do anything more.

## 2020-06-18 NOTE — Telephone Encounter (Signed)
Transition Care Management Follow-up Telephone Call  Date of discharge and from where: 06/16/20 from [facility].  How have you been since you were released from the hospital? Patient states,"I am feeling a lot better. I am eating small meals, still tired and resting well." Denies N/V since returning home, abd pain. Zofran on hand as needed for breakthrough vomiting, has not needed it. Loose stool improved. Staying hydrated. Last day for Reglan.   Any questions or concerns? No.  Items Reviewed:  Did the pt receive and understand the discharge instructions provided? Yes , add food slowly. Increase activity slowly.  Medications obtained and verified? Yes . Taking home scheduled medications as directed and without issues.   Other? Stopped famotidine 20mg . Taking omeprazole 20mg  provided at discharge.   Any new allergies since your discharge? No   Dietary orders reviewed? Low sodium heart healthy. Tolerating soups, baked potato, grilled chicken.  Do you have support? Yes, great friends are nearby and check on patient regularly.   Home Care and Equipment/Supplies: Were home health services ordered? No If so, what is the name of the agency? N/A Has the agency set up a time to come to the patient's home? N/A Were any new equipment or medical supplies ordered?  No What is the name of the medical supply agency? N/A Were you able to get the supplies/equipment? N/A Do you have any questions related to the use of the equipment or supplies? No  Functional Questionnaire: (I = Independent and D = Dependent) ADLs: i  Bathing/Dressing- i  Meal Prep- i  Eating- i  Maintaining continence- i  Transferring/Ambulation- i  Managing Meds- i  Follow up appointments reviewed:   PCP Hospital f/u appt confirmed? Yes  Scheduled to see Dr. Nicki Reaper on 07/06/20 @ 10:30. Notes she will call for sooner appointment if not continuing to improve or symptoms worsen.   [facility] f/u appt confirmed? Not  required. Colonoscopy rescheduled for a later date.   Are transportation arrangements needed? No   If their condition worsens, is the pt aware to call PCP or go to the Emergency Dept.? Yes  Was the patient provided with contact information for the PCP's office or ED? Yes  Was to pt encouraged to call back with questions or concerns? Yes

## 2020-06-19 NOTE — Telephone Encounter (Signed)
If another appointment available, okay to check with patient for rescheduling. Otherwise, she is comfortable keeping scheduled lab and hfu as is. Agrees to reach out if symptoms worsen and as needed.

## 2020-06-20 ENCOUNTER — Telehealth: Payer: Self-pay | Admitting: Internal Medicine

## 2020-06-20 NOTE — Telephone Encounter (Signed)
My chart sent to Ms Hilley for update.

## 2020-06-22 ENCOUNTER — Other Ambulatory Visit: Payer: Self-pay

## 2020-06-22 ENCOUNTER — Ambulatory Visit (INDEPENDENT_AMBULATORY_CARE_PROVIDER_SITE_OTHER): Payer: PPO

## 2020-06-22 ENCOUNTER — Encounter: Payer: Self-pay | Admitting: Emergency Medicine

## 2020-06-22 ENCOUNTER — Ambulatory Visit
Admission: EM | Admit: 2020-06-22 | Discharge: 2020-06-22 | Disposition: A | Discharge status: Home / Self Care | Payer: PPO | Attending: Family Medicine | Admitting: Family Medicine

## 2020-06-22 DIAGNOSIS — J0140 Acute pansinusitis, unspecified: Secondary | ICD-10-CM | POA: Diagnosis not present

## 2020-06-22 DIAGNOSIS — R062 Wheezing: Secondary | ICD-10-CM

## 2020-06-22 DIAGNOSIS — R5383 Other fatigue: Secondary | ICD-10-CM

## 2020-06-22 DIAGNOSIS — J209 Acute bronchitis, unspecified: Secondary | ICD-10-CM

## 2020-06-22 DIAGNOSIS — R059 Cough, unspecified: Secondary | ICD-10-CM | POA: Diagnosis not present

## 2020-06-22 DIAGNOSIS — R0602 Shortness of breath: Secondary | ICD-10-CM

## 2020-06-22 DIAGNOSIS — J3489 Other specified disorders of nose and nasal sinuses: Secondary | ICD-10-CM | POA: Diagnosis not present

## 2020-06-22 MED ORDER — PREDNISONE 10 MG (21) PO TBPK: ORAL_TABLET | Freq: Every day | ORAL | 0 refills | Status: AC

## 2020-06-22 MED ORDER — DOXYCYCLINE HYCLATE 100 MG PO CAPS: 100.0000 mg | ORAL_CAPSULE | Freq: Two times a day (BID) | ORAL | 0 refills | Status: DC

## 2020-06-22 NOTE — ED Triage Notes (Signed)
Patient in [place] c/o productive Cough, Nasal and chest congestion. Since 10d ago.  DDU:[id number], zyrtec, delsym   Denies:fever,

## 2020-06-22 NOTE — Discharge Instructions (Addendum)
Your chest xray was negative today  I have sent in doxycycline for you to take twice a day for 7 days for your URI  I have sent in a prednisone taper for you to take for 6 days. 6 tablets on day one, 5 tablets on day two, 4 tablets on day three, 3 tablets on day four, 2 tablets on day five, and 1 tablet on day six.  Follow up with this office or with primary care if symptoms are persisting.  Follow up in the ER for high fever, trouble swallowing, trouble breathing, other concerning symptoms.

## 2020-06-22 NOTE — ED Provider Notes (Signed)
[facility]   [id number] 06/22/20 Arrival Time: 6578   CC: COVID symptoms  SUBJECTIVE: History from: patient.  [name] [name] is a 69 y.o. female who presents with abrupt onset of nasal congestion, PND, and productive cough with dark green and gray sputum for the last 10 days. Was seen in the ER on 06/14/20 for intractable nausea and vomiting, states that this has since resolved. Has negative Covid test in the hospital at that time. Had positive Covid antibodies with labs on 05/25/20. Denies sick exposure to COVID, flu or strep. Denies recent travel. Has negative history of Covid. Has completed Covid vaccines. Has not taken OTC medications for this. Symptoms are worsened with activity. Denies previous symptoms in the past. Denies fever, chills, rhinorrhea, sore throat, chest pain, nausea, changes in bowel or bladder habits. Declines Covid test today.  ROS: As per HPI.  All other pertinent ROS negative.     Past Medical History:  Diagnosis Date  . Adenomatous polyp 2005  . Arrhythmia    H/O  . Basal cell carcinoma   . CTS (carpal tunnel syndrome)   . Environmental allergies   . History of chicken pox   . HTN (hypertension)   . Hypercholesterolemia   . Hypothyroidism    Past Surgical History:  Procedure Laterality Date  . BASAL CELL CARCINOMA EXCISION     right hip,abd, right arm, back neck  . BREAST CYST ASPIRATION Left    negative  . CTS releast Rt wrist    . lipoma removal     Rt shoulder   . MELANOMA EXCISION     right buttocks, top of scalp  . POLYPECTOMY     + TA  . TUBAL LIGATION  1982   Allergies  Allergen Reactions  . Codeine     NAUSEA  . Penicillins Hives   No current facility-administered medications on file prior to encounter.   Current Outpatient Medications on File Prior to Encounter  Medication Sig Dispense Refill  . cetirizine (ZYRTEC) 10 MG tablet Take 10 mg by mouth daily.    . cholecalciferol (VITAMIN D) 1000 units tablet Take 1,000  Units by mouth daily.    . Eszopiclone 3 MG TABS TAKE ONE TABLET BY MOUTH IMMEDIATELY BEFORE BEDTIME (Patient taking differently: Take 3 mg by mouth at bedtime. ) 30 tablet 1  . famotidine (PEPCID) 20 MG tablet Take 20 mg by mouth daily.    . irbesartan (AVAPRO) 75 MG tablet TAKE ONE TABLET BY MOUTH EVERY DAY (Patient taking differently: Take 75 mg by mouth daily. ) 90 tablet 1  . metoCLOPramide (REGLAN) 5 MG tablet Take 1 tablet (5 mg total) by mouth 3 (three) times daily before meals. 30 tablet 0  . Multiple Vitamins-Minerals (HAIR SKIN AND NAILS FORMULA PO) Take 1 tablet by mouth daily.    . Nutritional Supplements (NUTRITIONAL SUPPLEMENT PO) Take by mouth. DoTERRA: ALPHA CRS+, xEO MEGA, MICROPLEX MVp, ON GUARD (protective blend)    . omeprazole (PRILOSEC) 20 MG capsule Take 1 capsule (20 mg total) by mouth daily. 30 capsule 0  . ondansetron (ZOFRAN ODT) 4 MG disintegrating tablet Take 1 tablet (4 mg total) by mouth every 8 (eight) hours as needed for nausea or vomiting. 20 tablet 0  . rosuvastatin (CRESTOR) 5 MG tablet TAKE ONE TABLET EVERY DAY (Patient taking differently: Take 5 mg by mouth daily. ) 30 tablet 1  . SYNTHROID 175 MCG tablet TAKE 1 TABLET EVERY DAY ON EMPTY STOMACHWITH A GLASS OF  WATER AT LEAST 30-60 MINBEFORE BREAKFAST (Patient taking differently: Take 175 mcg by mouth daily. ) 30 tablet 1   Social History   Socioeconomic History  . Marital status: Widowed    Spouse name: Not on file  . Number of children: 1  . Years of education: Not on file  . Highest education level: Not on file  Occupational History  . 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 Smoking status: Former Smoker    Types: Cigarettes    Quit date: 07/16/1980    Years since quitting: 39.9  . Smokeless tobacco: Never Used  Vaping Use  . Vaping Use: Never used  Substance and Sexual Activity  . Alcohol use: Yes    Alcohol/week: 7.0 standard drinks    Types: 7  Glasses of wine per week    Comment: 1 per day   . Drug use: No  . Sexual activity: Never  Other Topics Concern  . Not on file  Social History Narrative   Widowed; 1 son.    Retired Art therapist   Daily caffeine use 2-3/day       Cell # [id number]   Social Determinants of Radio broadcast assistant Strain:   . Difficulty of Paying Living Expenses: Not on file  Food Insecurity: No Food Insecurity  . Worried About Charity fundraiser in the Last Year: Never true  . Ran Out of Food in the Last Year: Never true  Transportation Needs:   . Lack of Transportation (Medical): Not on file  . Lack of Transportation (Non-Medical): Not on file  Physical Activity:   . Days of Exercise per Week: Not on file  . Minutes of Exercise per Session: Not on file  Stress:   . Feeling of Stress : Not on file  Social Connections: Unknown  . Frequency of Communication with Friends and Family: More than three times a week  . Frequency of Social Gatherings with Friends and Family: More than three times a week  . Attends Religious Services: Not on file  . Active Member of Clubs or Organizations: Not on file  . Attends Archivist Meetings: Not on file  . Marital Status: Not on file  Intimate Partner Violence:   . Fear of Current or Ex-Partner: Not on file  . Emotionally Abused: Not on file  . Physically Abused: Not on file  . Sexually Abused: Not on file   Family History  Problem Relation Age of Onset  . Cervical cancer Mother   . Arthritis Mother   . Hypertension Mother   . Sudden death Mother        cerbral hemorrage  . Heart disease Father   . Cancer Father        lung cancer  . Diabetes Maternal Grandmother   . Breast cancer Paternal Aunt 74  . Colon cancer Neg Hx     OBJECTIVE:  Vitals:   06/22/20 1101 06/22/20 1102  BP:  (!) 157/81  Pulse:  62  Resp:  16  SpO2:  95%  Weight: 138 lb (62.6 kg)      General appearance: alert; appears fatigued, but nontoxic;  speaking in full sentences and tolerating own secretions HEENT: NCAT; Ears: EACs clear, TMs pearly gray; Eyes: PERRL.  EOM grossly intact. Sinuses: nontender; Nose: nares patent without rhinorrhea, Throat: oropharynx mildly erythematous, cobblestoning present, tonsils non erythematous or enlarged, uvula midline  Neck: supple with LAD Lungs: unlabored  respirations, symmetrical air entry; cough: moderate; no respiratory distress; diffuse wheezing throughout bilateral lung fields, coarse lung sounds to bilateral lower lobes Heart: regular rate and rhythm.  Radial pulses 2+ symmetrical bilaterally Skin: warm and dry Psychological: alert and cooperative; normal mood and affect  LABS:  No results found for this or any previous visit (from the past 24 hour(s)).   ASSESSMENT & PLAN:  1. Acute non-recurrent pansinusitis   2. Cough   3. Sinus pain   4. SOB (shortness of breath)   5. Other fatigue   6. Wheezing   7. Acute bronchitis, unspecified organism     Meds ordered this encounter  Medications  . doxycycline (VIBRAMYCIN) 100 MG capsule    Sig: Take 1 capsule (100 mg total) by mouth 2 (two) times daily.    Dispense:  14 capsule    Refill:  0    Order Specific Question:   Supervising Provider    Answer:   Chase Picket [id number]  . predniSONE (STERAPRED UNI-PAK 21 TAB) 10 MG (21) TBPK tablet    Sig: Take by mouth daily for 6 days. Take 6 tablets on day 1, 5 tablets on day 2, 4 tablets on day 3, 3 tablets on day 4, 2 tablets on day 5, 1 tablet on day 6    Dispense:  21 tablet    Refill:  0    Order Specific Question:   Supervising Provider    Answer:   Chase Picket [[id number]]   CXR negative for pneumonia today Prescribed doxycycline Prescribed steroid taper Get plenty of rest and push fluids Use OTC zyrtec for nasal congestion, runny nose, and/or sore throat Use OTC flonase for nasal congestion and runny nose Use medications daily for symptom relief Use OTC medications  like ibuprofen or tylenol as needed fever or pain Call or go to the ED if you have any new or worsening symptoms such as fever, worsening cough, shortness of breath, chest tightness, chest pain, turning blue, changes in mental status.  Reviewed expectations re: course of current medical issues. Questions answered. Outlined signs and symptoms indicating need for more acute intervention. Patient verbalized understanding. After Visit Summary given.         Faustino Congress, NP 06/22/20 1137

## 2020-06-29 ENCOUNTER — Encounter: Payer: Self-pay | Admitting: Internal Medicine

## 2020-07-03 ENCOUNTER — Other Ambulatory Visit: Payer: Self-pay

## 2020-07-03 ENCOUNTER — Other Ambulatory Visit (INDEPENDENT_AMBULATORY_CARE_PROVIDER_SITE_OTHER): Payer: PPO

## 2020-07-03 DIAGNOSIS — Z20822 Contact with and (suspected) exposure to covid-19: Secondary | ICD-10-CM | POA: Diagnosis not present

## 2020-07-03 DIAGNOSIS — I10 Essential (primary) hypertension: Secondary | ICD-10-CM | POA: Diagnosis not present

## 2020-07-03 DIAGNOSIS — E785 Hyperlipidemia, unspecified: Secondary | ICD-10-CM

## 2020-07-03 DIAGNOSIS — E039 Hypothyroidism, unspecified: Secondary | ICD-10-CM | POA: Diagnosis not present

## 2020-07-03 LAB — BASIC METABOLIC PANEL
BUN: 21 mg/dL (ref 6–23)
CO2: 29 mEq/L (ref 19–32)
Calcium: 8.9 mg/dL (ref 8.4–10.5)
Chloride: 103 mEq/L (ref 96–112)
Creatinine, Ser: 0.73 mg/dL (ref 0.40–1.20)
GFR: 82.96 mL/min (ref >60.00)
Glucose, Bld: 98 mg/dL (ref 70–99)
Potassium: 4.1 mEq/L (ref 3.5–5.1)
Sodium: 139 mEq/L (ref 135–145)

## 2020-07-03 LAB — HEPATIC FUNCTION PANEL
ALT: 21 U/L (ref 0–35)
AST: 18 U/L (ref 0–37)
Albumin: 4.1 g/dL (ref 3.5–5.2)
Alkaline Phosphatase: 81 U/L (ref 39–117)
Bilirubin, Direct: 0.1 mg/dL (ref 0.0–0.3)
Total Bilirubin: 0.5 mg/dL (ref 0.2–1.2)
Total Protein: 6.1 g/dL (ref 6.0–8.3)

## 2020-07-03 LAB — LIPID PANEL
Cholesterol: 170 mg/dL (ref 0–200)
HDL: 75.4 mg/dL (ref >39.00)
LDL Cholesterol: 83 mg/dL (ref 0–99)
NonHDL: 94.16
Total CHOL/HDL Ratio: 2
Triglycerides: 58 mg/dL (ref 0.0–149.0)
VLDL: 11.6 mg/dL (ref 0.0–40.0)

## 2020-07-03 LAB — TSH: TSH: 0.63 u[IU]/mL (ref 0.35–4.50)

## 2020-07-06 ENCOUNTER — Other Ambulatory Visit: Payer: Self-pay

## 2020-07-06 ENCOUNTER — Ambulatory Visit (INDEPENDENT_AMBULATORY_CARE_PROVIDER_SITE_OTHER): Payer: PPO | Admitting: Internal Medicine

## 2020-07-06 DIAGNOSIS — E785 Hyperlipidemia, unspecified: Secondary | ICD-10-CM | POA: Diagnosis not present

## 2020-07-06 DIAGNOSIS — R0981 Nasal congestion: Secondary | ICD-10-CM

## 2020-07-06 DIAGNOSIS — R112 Nausea with vomiting, unspecified: Secondary | ICD-10-CM | POA: Diagnosis not present

## 2020-07-06 DIAGNOSIS — I10 Essential (primary) hypertension: Secondary | ICD-10-CM

## 2020-07-06 DIAGNOSIS — E039 Hypothyroidism, unspecified: Secondary | ICD-10-CM | POA: Diagnosis not present

## 2020-07-06 LAB — SARS COV-2 SEROLOGY(COVID-19)AB(IGG,IGM),IMMUNOASSAY
SARS CoV-2 AB IgG: NEGATIVE
SARS CoV-2 IgM: NEGATIVE

## 2020-07-06 MED ORDER — ROSUVASTATIN CALCIUM 5 MG PO TABS
5.0000 mg | ORAL_TABLET | Freq: Every day | ORAL | 5 refills | Status: DC
Start: 2020-07-06 — End: 2021-01-25

## 2020-07-06 MED ORDER — SYNTHROID 175 MCG PO TABS
ORAL_TABLET | ORAL | 5 refills | Status: DC
Start: 2020-07-06 — End: 2021-02-01

## 2020-07-06 MED ORDER — OMEPRAZOLE 20 MG PO CPDR
20.0000 mg | DELAYED_RELEASE_CAPSULE | Freq: Every day | ORAL | 5 refills | Status: DC
Start: 2020-07-06 — End: 2020-11-03

## 2020-07-06 NOTE — Progress Notes (Signed)
Patient ID: FEDRA LANTER, female   DOB: 1951-02-05, 69 y.o.   MRN: [id number]   Subjective:    Patient ID: KATILYNN SINKLER, female    DOB: 11-10-1950, 69 y.o.   MRN: [id number]  HPI This visit occurred during the SARS-CoV-2 public health emergency.  Safety protocols were in place, including screening questions prior to the visit, additional usage of staff PPE, and extensive cleaning of exam room while observing appropriate contact time as indicated for disinfecting solutions.  Patient here for a scheduled follow up.  Was hospitalized 06/15/20 - 06/16/20 with intractable nausea and vomiting.  Placed on reglan.  Also found to have hypokalemia.  Appetite has improved.  Eating.  Still with fatigue.  Was reevaluated at [facility] with nasal congestion, PND and productive cough. Diagnosed with pansinusitis.  Treated with doxycycline and steroid taper.  cxr - lungs clear.  Feels better.  Symptoms improved.  Still with some sinus pressure.  Using astelin nasal spray.  Overall doing better.  No chest pain or sob.  Cough better.    Past Medical History:  Diagnosis Date  . Adenomatous polyp 2005  . Arrhythmia    H/O  . Basal cell carcinoma   . CTS (carpal tunnel syndrome)   . Environmental allergies   . History of chicken pox   . HTN (hypertension)   . Hypercholesterolemia   . Hypothyroidism    Past Surgical History:  Procedure Laterality Date  . BASAL CELL CARCINOMA EXCISION     right hip,abd, right arm, back neck  . BREAST CYST ASPIRATION Left    negative  . CTS releast Rt wrist    . lipoma removal     Rt shoulder   . MELANOMA EXCISION     right buttocks, top of scalp  . POLYPECTOMY     + TA  . TUBAL LIGATION  1982   Family History  Problem Relation Age of Onset  . Cervical cancer Mother   . Arthritis Mother   . Hypertension Mother   . Sudden death Mother        cerbral hemorrage  . Heart disease Father   . Cancer Father        lung cancer  . Diabetes Maternal Grandmother   . Breast  cancer Paternal Aunt 74  . Colon cancer Neg Hx    Social History   Socioeconomic History  . Marital status: Widowed    Spouse name: Not on file  . Number of children: 1  . Years of education: Not on file  . Highest education level: Not on file  Occupational History  . 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 Smoking status: Former Smoker    Types: Cigarettes    Quit date: 07/16/1980    Years since quitting: 40.0  . Smokeless tobacco: Never Used  Vaping Use  . Vaping Use: Never used  Substance and Sexual Activity  . Alcohol use: Yes    Alcohol/week: 7.0 standard drinks    Types: 7 Glasses of wine per week    Comment: 1 per day   . Drug use: No  . Sexual activity: Never  Other Topics Concern  . Not on file  Social History Narrative   Widowed; 1 son.    Retired Art therapist   Daily caffeine use 2-3/day       Cell # [id number]   Social Determinants of Radio broadcast assistant Strain:   .  Difficulty of Paying Living Expenses: Not on file  Food Insecurity: No Food Insecurity  . Worried About Charity fundraiser in the Last Year: Never true  . Ran Out of Food in the Last Year: Never true  Transportation Needs:   . Lack of Transportation (Medical): Not on file  . Lack of Transportation (Non-Medical): Not on file  Physical Activity:   . Days of Exercise per Week: Not on file  . Minutes of Exercise per Session: Not on file  Stress:   . Feeling of Stress : Not on file  Social Connections: Unknown  . Frequency of Communication with Friends and Family: More than three times a week  . Frequency of Social Gatherings with Friends and Family: More than three times a week  . Attends Religious Services: Not on file  . Active Member of Clubs or Organizations: Not on file  . Attends Archivist Meetings: Not on file  . Marital Status: Not on file    Outpatient Encounter Medications as of 07/06/2020  Medication Sig  .  cetirizine (ZYRTEC) 10 MG tablet Take 10 mg by mouth daily.  . cholecalciferol (VITAMIN D) 1000 units tablet Take 1,000 Units by mouth daily.  . Eszopiclone 3 MG TABS TAKE ONE TABLET BY MOUTH IMMEDIATELY BEFORE BEDTIME (Patient taking differently: Take 3 mg by mouth at bedtime. )  . irbesartan (AVAPRO) 75 MG tablet Take 1 tablet (75 mg total) by mouth daily.  . metoCLOPramide (REGLAN) 5 MG tablet Take 1 tablet (5 mg total) by mouth 3 (three) times daily before meals.  . Multiple Vitamins-Minerals (HAIR SKIN AND NAILS FORMULA PO) Take 1 tablet by mouth daily.  . Nutritional Supplements (NUTRITIONAL SUPPLEMENT PO) Take by mouth. DoTERRA: ALPHA CRS+, xEO MEGA, MICROPLEX MVp, ON GUARD (protective blend)  . omeprazole (PRILOSEC) 20 MG capsule Take 1 capsule (20 mg total) by mouth daily.  . ondansetron (ZOFRAN ODT) 4 MG disintegrating tablet Take 1 tablet (4 mg total) by mouth every 8 (eight) hours as needed for nausea or vomiting.  . rosuvastatin (CRESTOR) 5 MG tablet Take 1 tablet (5 mg total) by mouth daily.  Marland Kitchen SYNTHROID 175 MCG tablet TAKE 1 TABLET EVERY DAY ON EMPTY STOMACHWITH A GLASS OF WATER AT LEAST 30-60 MINBEFORE BREAKFAST  . [DISCONTINUED] doxycycline (VIBRAMYCIN) 100 MG capsule Take 1 capsule (100 mg total) by mouth 2 (two) times daily.  . [DISCONTINUED] famotidine (PEPCID) 20 MG tablet Take 20 mg by mouth daily.  . [DISCONTINUED] irbesartan (AVAPRO) 75 MG tablet TAKE ONE TABLET BY MOUTH EVERY DAY (Patient taking differently: Take 75 mg by mouth daily. )  . [DISCONTINUED] omeprazole (PRILOSEC) 20 MG capsule Take 1 capsule (20 mg total) by mouth daily.  . [DISCONTINUED] rosuvastatin (CRESTOR) 5 MG tablet TAKE ONE TABLET EVERY DAY (Patient taking differently: Take 5 mg by mouth daily. )  . [DISCONTINUED] SYNTHROID 175 MCG tablet TAKE 1 TABLET EVERY DAY ON EMPTY STOMACHWITH A GLASS OF WATER AT LEAST 30-60 MINBEFORE BREAKFAST (Patient taking differently: Take 175 mcg by mouth daily. )   No  facility-administered encounter medications on file as of 07/06/2020.    Review of Systems  Constitutional: Positive for fatigue. Negative for appetite change and unexpected weight change.  HENT: Positive for sinus pressure. Negative for congestion.   Respiratory: Negative for chest tightness and shortness of breath.        Cough improved.   Cardiovascular: Negative for chest pain, palpitations and leg swelling.  Gastrointestinal: Negative for abdominal pain,  diarrhea, nausea and vomiting.  Genitourinary: Negative for difficulty urinating and dysuria.  Musculoskeletal: Negative for joint swelling and myalgias.  Skin: Negative for color change and rash.  Neurological: Negative for dizziness, light-headedness and headaches.  Psychiatric/Behavioral: Negative for agitation and dysphoric mood.       Objective:    Physical Exam Vitals reviewed.  Constitutional:      General: She is not in acute distress.    Appearance: Normal appearance.  HENT:     Head: Normocephalic and atraumatic.     Right Ear: External ear normal.     Left Ear: External ear normal.  Eyes:     General: No scleral icterus.       Right eye: No discharge.        Left eye: No discharge.     Conjunctiva/sclera: Conjunctivae normal.  Neck:     Thyroid: No thyromegaly.  Cardiovascular:     Rate and Rhythm: Normal rate and regular rhythm.  Pulmonary:     Effort: No respiratory distress.     Breath sounds: Normal breath sounds. No wheezing.  Abdominal:     General: Bowel sounds are normal.     Palpations: Abdomen is soft.     Tenderness: There is no abdominal tenderness.  Musculoskeletal:        General: No swelling or tenderness.     Cervical back: Neck supple. No tenderness.  Lymphadenopathy:     Cervical: No cervical adenopathy.  Skin:    Findings: No erythema or rash.  Neurological:     Mental Status: She is alert.  Psychiatric:        Mood and Affect: Mood normal.        Behavior: Behavior normal.      BP 120/70   Pulse 82   Temp 98.4 F (36.9 C) (Oral)   Resp 16   Ht 5' 3"  (1.6 m)   Wt 141 lb (64 kg)   SpO2 99%   BMI 24.98 kg/m  Wt Readings from Last 3 Encounters:  07/06/20 141 lb (64 kg)  06/22/20 138 lb (62.6 kg)  06/14/20 138 lb 0.1 oz (62.6 kg)     Lab Results  Component Value Date   WBC 11.5 (H) 06/15/2020   HGB 12.4 06/15/2020   HCT 36.0 06/15/2020   PLT 219 06/15/2020   GLUCOSE 98 07/03/2020   CHOL 170 07/03/2020   TRIG 58.0 07/03/2020   HDL 75.40 07/03/2020   LDLDIRECT 118.2 10/16/2006   LDLCALC 83 07/03/2020   ALT 21 07/03/2020   AST 18 07/03/2020   NA 139 07/03/2020   K 4.1 07/03/2020   CL 103 07/03/2020   CREATININE 0.73 07/03/2020   BUN 21 07/03/2020   CO2 29 07/03/2020   TSH 0.63 07/03/2020    DG Chest 2 View  Result Date: 06/22/2020 CLINICAL DATA:  Cough and shortness of breath EXAM: CHEST - 2 VIEW COMPARISON:  None. FINDINGS: Lungs are clear. The heart size and pulmonary vascularity are normal. No adenopathy. No bone lesions. IMPRESSION: Lungs clear.  Cardiac silhouette within normal limits. Electronically Signed   By: Lowella Grip III M.D.   On: 06/22/2020 11:28       Assessment & Plan:   Problem List Items Addressed This Visit    Sinus congestion    Sinus/respiratory symptoms have improved.  astelin nasal spray/saline nasal spray.  Follow.        Intractable nausea and vomiting    Admitted recently as outlined.  Treated  with reglan.  Resolved.  Appetite improved.  No nausea or vomiting currently.  Follow.  Recent met b wnl.  (potassium wnl).       Hypothyroidism    On thyroid replacement.  Follow tsh.       Relevant Medications   SYNTHROID 175 MCG tablet   Hyperlipidemia    On crestor.  Low cholesterol diet and exercise.  Follow lipid panel and liver function tests.        Relevant Medications   rosuvastatin (CRESTOR) 5 MG tablet   irbesartan (AVAPRO) 75 MG tablet   Other Relevant Orders   Hepatic function panel    Lipid panel   Essential hypertension    Blood pressure on my check 128/72.  Continue avapro.  Follow pressures.  Follow metabolic panel       Relevant Medications   rosuvastatin (CRESTOR) 5 MG tablet   irbesartan (AVAPRO) 75 MG tablet   Other Relevant Orders   CBC with Differential/Platelet   Basic metabolic panel       Einar Pheasant, MD

## 2020-07-12 ENCOUNTER — Encounter: Payer: Self-pay | Admitting: Internal Medicine

## 2020-07-12 DIAGNOSIS — R0981 Nasal congestion: Secondary | ICD-10-CM | POA: Insufficient documentation

## 2020-07-12 MED ORDER — IRBESARTAN 75 MG PO TABS
75.0000 mg | ORAL_TABLET | Freq: Every day | ORAL | 1 refills | Status: DC
Start: 2020-07-12 — End: 2020-12-18

## 2020-07-12 NOTE — Assessment & Plan Note (Signed)
On thyroid replacement.  Follow tsh.  

## 2020-07-12 NOTE — Assessment & Plan Note (Signed)
On crestor.  Low cholesterol diet and exercise.  Follow lipid panel and liver function tests.   

## 2020-07-12 NOTE — Assessment & Plan Note (Signed)
Sinus/respiratory symptoms have improved.  astelin nasal spray/saline nasal spray.  Follow.

## 2020-07-12 NOTE — Assessment & Plan Note (Signed)
Blood pressure on my check 128/72.  Continue avapro.  Follow pressures.  Follow metabolic panel

## 2020-07-12 NOTE — Assessment & Plan Note (Addendum)
Admitted recently as outlined.  Treated with reglan.  Resolved.  Appetite improved.  No nausea or vomiting currently.  Follow.  Recent met b wnl.  (potassium wnl).

## 2020-07-20 ENCOUNTER — Other Ambulatory Visit: Payer: PPO | Attending: Gastroenterology

## 2020-07-20 ENCOUNTER — Telehealth: Payer: Self-pay

## 2020-07-20 NOTE — Telephone Encounter (Signed)
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 She thinks he has injury his leg and she is on the way to the ER with him. She wants to cancel her colonoscopy due to this. She wants to move it to 08/24/20. Called Trish and informed her of the change and also put it in the referral

## 2020-07-23 ENCOUNTER — Other Ambulatory Visit: Payer: Self-pay | Admitting: Internal Medicine

## 2020-07-23 NOTE — Telephone Encounter (Signed)
rx ok'd for lunesta #30 with one refill.

## 2020-07-31 ENCOUNTER — Other Ambulatory Visit: Payer: Self-pay | Admitting: Internal Medicine

## 2020-07-31 DIAGNOSIS — Z1231 Encounter for screening mammogram for malignant neoplasm of breast: Secondary | ICD-10-CM

## 2020-08-20 ENCOUNTER — Other Ambulatory Visit: Payer: Self-pay

## 2020-08-20 ENCOUNTER — Other Ambulatory Visit
Admission: RE | Admit: 2020-08-20 | Discharge: 2020-08-20 | Disposition: A | Discharge status: Home / Self Care | Payer: PPO | Source: Ambulatory Visit | Attending: Gastroenterology | Admitting: Gastroenterology

## 2020-08-20 DIAGNOSIS — Z01812 Encounter for preprocedural laboratory examination: Secondary | ICD-10-CM | POA: Insufficient documentation

## 2020-08-20 DIAGNOSIS — Z20822 Contact with and (suspected) exposure to covid-19: Secondary | ICD-10-CM | POA: Diagnosis not present

## 2020-08-20 LAB — SARS CORONAVIRUS 2 (TAT 6-24 HRS): SARS Coronavirus 2: NEGATIVE

## 2020-08-24 ENCOUNTER — Encounter
Admission: RE | Disposition: A | Discharge status: Home / Self Care | Payer: Self-pay | Source: Home / Self Care | Attending: Gastroenterology

## 2020-08-24 ENCOUNTER — Ambulatory Visit
Admission: RE | Admit: 2020-08-24 | Discharge: 2020-08-24 | Disposition: A | Discharge status: Home / Self Care | Payer: PPO | Attending: Gastroenterology | Admitting: Gastroenterology

## 2020-08-24 ENCOUNTER — Ambulatory Visit: Payer: PPO | Admitting: Certified Registered Nurse Anesthetist

## 2020-08-24 ENCOUNTER — Other Ambulatory Visit: Payer: Self-pay

## 2020-08-24 ENCOUNTER — Encounter: Payer: Self-pay | Admitting: Gastroenterology

## 2020-08-24 DIAGNOSIS — Z87891 Personal history of nicotine dependence: Secondary | ICD-10-CM | POA: Insufficient documentation

## 2020-08-24 DIAGNOSIS — Z8049 Family history of malignant neoplasm of other genital organs: Secondary | ICD-10-CM | POA: Insufficient documentation

## 2020-08-24 DIAGNOSIS — K644 Residual hemorrhoidal skin tags: Secondary | ICD-10-CM | POA: Diagnosis not present

## 2020-08-24 DIAGNOSIS — Z8261 Family history of arthritis: Secondary | ICD-10-CM | POA: Insufficient documentation

## 2020-08-24 DIAGNOSIS — Z823 Family history of stroke: Secondary | ICD-10-CM | POA: Diagnosis not present

## 2020-08-24 DIAGNOSIS — Z79899 Other long term (current) drug therapy: Secondary | ICD-10-CM | POA: Insufficient documentation

## 2020-08-24 DIAGNOSIS — R195 Other fecal abnormalities: Secondary | ICD-10-CM | POA: Insufficient documentation

## 2020-08-24 DIAGNOSIS — Z803 Family history of malignant neoplasm of breast: Secondary | ICD-10-CM | POA: Insufficient documentation

## 2020-08-24 DIAGNOSIS — Z8249 Family history of ischemic heart disease and other diseases of the circulatory system: Secondary | ICD-10-CM | POA: Diagnosis not present

## 2020-08-24 DIAGNOSIS — Z801 Family history of malignant neoplasm of trachea, bronchus and lung: Secondary | ICD-10-CM | POA: Diagnosis not present

## 2020-08-24 DIAGNOSIS — Z833 Family history of diabetes mellitus: Secondary | ICD-10-CM | POA: Insufficient documentation

## 2020-08-24 HISTORY — PX: COLONOSCOPY WITH PROPOFOL: SHX5780

## 2020-08-24 SURGERY — COLONOSCOPY WITH PROPOFOL
Anesthesia: General

## 2020-08-24 MED ORDER — SODIUM CHLORIDE 0.9 % IV SOLN: INTRAVENOUS | Status: DC

## 2020-08-24 MED ORDER — PROPOFOL 500 MG/50ML IV EMUL
INTRAVENOUS | Status: AC
  Filled 2020-08-24: qty 50

## 2020-08-24 MED ORDER — PROPOFOL 500 MG/50ML IV EMUL
INTRAVENOUS | Status: DC | PRN
  Administered 2020-08-24: 100 ug/kg/min via INTRAVENOUS

## 2020-08-24 MED ORDER — PROPOFOL 10 MG/ML IV BOLUS
INTRAVENOUS | Status: DC | PRN
  Administered 2020-08-24: 20 mg via INTRAVENOUS
  Administered 2020-08-24: 40 mg via INTRAVENOUS

## 2020-08-24 MED ORDER — LIDOCAINE HCL (CARDIAC) PF 100 MG/5ML IV SOSY
PREFILLED_SYRINGE | INTRAVENOUS | Status: DC | PRN
  Administered 2020-08-24: 50 mg via INTRATRACHEAL

## 2020-08-24 NOTE — H&P (Signed)
[name] Darby, MD [address]  [place]  [place], [place] [address]  Main: [phone]  Fax: [phone] Pager: [phone]  Primary Care Physician:  Einar Pheasant, MD Primary Gastroenterologist:  Dr. Cephas [name]  Pre-Procedure History & Physical: HPI:  [name] [name] is a 70 y.o. female is here for an colonoscopy.   Past Medical History:  Diagnosis Date  . Adenomatous polyp 2005  . Arrhythmia    H/O  . Basal cell carcinoma   . CTS (carpal tunnel syndrome)   . Environmental allergies   . History of chicken pox   . HTN (hypertension)   . Hypercholesterolemia   . Hypothyroidism     Past Surgical History:  Procedure Laterality Date  . BASAL CELL CARCINOMA EXCISION     right hip,abd, right arm, back neck  . BREAST CYST ASPIRATION Left    negative  . CTS releast Rt wrist    . lipoma removal     Rt shoulder   . MELANOMA EXCISION     right buttocks, top of scalp  . POLYPECTOMY     + TA  . TUBAL LIGATION  1982    Prior to Admission medications   Medication Sig Start Date End Date Taking? Authorizing Provider  irbesartan (AVAPRO) 75 MG tablet Take 1 tablet (75 mg total) by mouth daily. 07/12/20  Yes Einar Pheasant, MD  cetirizine (ZYRTEC) 10 MG tablet Take 10 mg by mouth daily.    [provider]  cholecalciferol (VITAMIN D) 1000 units tablet Take 1,000 Units by mouth daily.    [provider]  Eszopiclone 3 MG TABS Take 1 tablet (3 mg total) by mouth at bedtime. 07/23/20   Einar Pheasant, MD  metoCLOPramide (REGLAN) 5 MG tablet Take 1 tablet (5 mg total) by mouth 3 (three) times daily before meals. 06/16/20   Lorella Nimrod, MD  Multiple Vitamins-Minerals (HAIR SKIN AND NAILS FORMULA PO) Take 1 tablet by mouth daily. 05/25/18   [provider]  Nutritional Supplements (NUTRITIONAL SUPPLEMENT PO) Take by mouth. DoTERRA: ALPHA CRS+, xEO MEGA, MICROPLEX MVp, ON GUARD (protective blend)    [provider]  omeprazole  (PRILOSEC) 20 MG capsule Take 1 capsule (20 mg total) by mouth daily. 07/06/20   Einar Pheasant, MD  ondansetron (ZOFRAN ODT) 4 MG disintegrating tablet Take 1 tablet (4 mg total) by mouth every 8 (eight) hours as needed for nausea or vomiting. 06/14/20   Naaman Plummer, MD  rosuvastatin (CRESTOR) 5 MG tablet Take 1 tablet (5 mg total) by mouth daily. 07/06/20   Einar Pheasant, MD  SYNTHROID 175 MCG tablet TAKE 1 TABLET EVERY DAY ON EMPTY STOMACHWITH A GLASS OF WATER AT LEAST 30-60 MINBEFORE BREAKFAST 07/06/20   Einar Pheasant, MD    Allergies as of 06/04/2020 - Review Complete 06/03/2020  Allergen Reaction Noted  . Codeine    . Penicillins Hives 06/26/2017    Family History  Problem Relation Age of Onset  . Cervical cancer Mother   . Arthritis Mother   . Hypertension Mother   . Sudden death Mother        cerbral hemorrage  . Heart disease Father   . Cancer Father        lung cancer  . Diabetes Maternal Grandmother   . Breast cancer Paternal Aunt 74  . Colon cancer Neg Hx     Social History   Socioeconomic History  . Marital status: Widowed    Spouse name: Not on file  .  Number of children: 1  . Years of education: Not on file  . Highest education level: Not on file  Occupational History  . 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 Smoking status: Former Smoker    Types: Cigarettes    Quit date: 07/16/1980    Years since quitting: 40.1  . Smokeless tobacco: Never Used  Vaping Use  . Vaping Use: Never used  Substance and Sexual Activity  . Alcohol use: Yes    Alcohol/week: 7.0 standard drinks    Types: 7 Glasses of wine per week    Comment: 1 per day   . Drug use: No  . Sexual activity: Never  Other Topics Concern  . Not on file  Social History Narrative   Widowed; 1 son.    Retired Art therapist   Daily caffeine use 2-3/day       Cell # [id number]   Social Determinants of Radio broadcast assistant Strain:  Not on file  Food Insecurity: No Food Insecurity  . Worried About Charity fundraiser in the Last Year: Never true  . Ran Out of Food in the Last Year: Never true  Transportation Needs: Not on file  Physical Activity: Not on file  Stress: Not on file  Social Connections: Unknown  . Frequency of Communication with Friends and Family: More than three times a week  . Frequency of Social Gatherings with Friends and Family: More than three times a week  . Attends Religious Services: Not on file  . Active Member of Clubs or Organizations: Not on file  . Attends Archivist Meetings: Not on file  . Marital Status: Not on file  Intimate Partner Violence: Not on file    Review of Systems: See HPI, otherwise negative ROS  Physical Exam: BP (!) 157/78   Pulse 70   Temp (!) 96.6 F (35.9 C) (Temporal)   Resp 17   Ht 5\' 2"  (1.575 m)   Wt 63.5 kg   SpO2 100%   BMI 25.61 kg/m  General:   Alert,  pleasant and cooperative in NAD Head:  Normocephalic and atraumatic. Neck:  Supple; no masses or thyromegaly. Lungs:  Clear throughout to auscultation.    Heart:  Regular rate and rhythm. Abdomen:  Soft, nontender and nondistended. Normal bowel sounds, without guarding, and without rebound.   Neurologic:  Alert and  oriented x4;  grossly normal neurologically.  Impression/Plan: [name] is here for an colonoscopy to be performed for heme positive stool  Risks, benefits, limitations, and alternatives regarding  colonoscopy have been reviewed with the patient.  Questions have been answered.  All parties agreeable.   Sherri Sear, MD  08/24/2020, 9:22 AM

## 2020-08-24 NOTE — Op Note (Signed)
[facility] Gastroenterology Patient Name: [name] [name] Procedure Date: 08/24/2020 9:31 AM MRN: [id number] Account #: [id number] Date of Birth: [birth year] Admit Type: Outpatient Age: 70 Room: [facility] ENDO ROOM 1 Gender: Female Note Status: Finalized Procedure:             Colonoscopy Indications:           Positive fecal immunochemical test Providers:             Lin Landsman MD, MD Referring MD:          Einar Pheasant, MD (Referring MD) Medicines:             General Anesthesia Complications:         No immediate complications. Estimated blood loss: None. Procedure:             Pre-Anesthesia Assessment:                        - Prior to the procedure, a History and Physical was                         performed, and patient medications and allergies were                         reviewed. The patient is competent. The risks and                         benefits of the procedure and the sedation options and                         risks were discussed with the patient. All questions                         were answered and informed consent was obtained.                         Patient identification and proposed procedure were                         verified by the physician, the nurse, the                         anesthesiologist, the anesthetist and the technician                         in the pre-procedure area in the procedure room in the                         endoscopy suite. Mental Status Examination: alert and                         oriented. Airway Examination: normal oropharyngeal                         airway and neck mobility. Respiratory Examination:                         clear to auscultation. CV Examination: normal.  Prophylactic Antibiotics: The patient does not require                         prophylactic antibiotics. Prior Anticoagulants: The                         patient has taken no previous anticoagulant or                          antiplatelet agents. ASA Grade Assessment: II - A                         patient with mild systemic disease. After reviewing                         the risks and benefits, the patient was deemed in                         satisfactory condition to undergo the procedure. The                         anesthesia plan was to use general anesthesia.                         Immediately prior to administration of medications,                         the patient was re-assessed for adequacy to receive                         sedatives. The heart rate, respiratory rate, oxygen                         saturations, blood pressure, adequacy of pulmonary                         ventilation, and response to care were monitored                         throughout the procedure. The physical status of the                         patient was re-assessed after the procedure.                        After obtaining informed consent, the colonoscope was                         passed under direct vision. Throughout the procedure,                         the patient's blood pressure, pulse, and oxygen                         saturations were monitored continuously. The                         Colonoscope was introduced through the anus and  advanced to the the terminal ileum, with                         identification of the appendiceal orifice and IC                         valve. The colonoscopy was performed with moderate                         difficulty due to significant looping and a tortuous                         colon. Successful completion of the procedure was                         aided by applying abdominal pressure. The patient                         tolerated the procedure well. The quality of the bowel                         preparation was evaluated using the BBPS [facility] Bowel                         Preparation Scale) with scores of: Right  Colon = 3,                         Transverse Colon = 3 and Left Colon = 3 (entire mucosa                         seen well with no residual staining, small fragments                         of stool or opaque liquid). The total BBPS score                         equals 9. Findings:      The perianal and digital rectal examinations were normal. Pertinent       negatives include normal sphincter tone and no palpable rectal lesions.      The terminal ileum appeared normal.      Non-bleeding external hemorrhoids were found during retroflexion. The       hemorrhoids were small.      The exam was otherwise without abnormality.      The entire examined colon appeared normal. Impression:            - The examined portion of the ileum was normal.                        - Non-bleeding external hemorrhoids.                        - The examination was otherwise normal.                        - The entire examined colon is normal.                        -  No specimens collected. Recommendation:        - Discharge patient to home (with escort).                        - Resume previous diet today.                        - Continue present medications.                        - Repeat colonoscopy in 10 years for screening                         purposes. Procedure Code(s):     --- Professional ---                        [phone], Colonoscopy, flexible; diagnostic, including                         collection of specimen(s) by brushing or washing, when                         performed (separate procedure) Diagnosis Code(s):     --- Professional ---                        K64.4, Residual hemorrhoidal skin tags                        R19.5, Other fecal abnormalities CPT copyright 2019 American Medical Association. All rights reserved. The codes documented in this report are preliminary and upon coder review may  be revised to meet current compliance requirements. Dr. Ulyess Mort Lin Landsman  MD, MD 08/24/2020 9:59:09 AM This report has been signed electronically. Number of Addenda: 0 Note Initiated On: 08/24/2020 9:31 AM Scope Withdrawal Time: 0 hours 9 minutes 53 seconds  Total Procedure Duration: 0 hours 15 minutes 36 seconds  Estimated Blood Loss:  Estimated blood loss: none.      [facility]

## 2020-08-24 NOTE — Transfer of Care (Signed)
Immediate Anesthesia Transfer of Care Note  Patient: [name] [name]  Procedure(s) Performed: COLONOSCOPY WITH PROPOFOL (N/A )  Patient Location: PACU  Anesthesia Type:General  Level of Consciousness: awake, alert  and oriented  Airway & Oxygen Therapy: Patient Spontanous Breathing  Post-op Assessment: Report given to RN and Post -op Vital signs reviewed and stable  Post vital signs: Reviewed and stable  Last Vitals:  Vitals Value Taken Time  BP    Temp    Pulse    Resp    SpO2      Last Pain:  Vitals:   08/24/20 0848  TempSrc: Temporal  PainSc: 0-No pain         Complications: No complications documented.

## 2020-08-24 NOTE — Anesthesia Preprocedure Evaluation (Signed)
Anesthesia Evaluation  Patient identified by MRN, date of birth, ID band Patient awake    Reviewed: Allergy & Precautions, H&P , NPO status , Patient's Chart, lab work & pertinent test results, reviewed documented beta blocker date and time   History of Anesthesia Complications Negative for: history of anesthetic complications  Airway Mallampati: III  TM Distance: >3 FB Neck ROM: full    Dental  (+) Dental Advidsory Given, Teeth Intact, Caps   Pulmonary neg pulmonary ROS, former smoker,    Pulmonary exam normal breath sounds clear to auscultation       Cardiovascular Exercise Tolerance: Good hypertension, (-) angina(-) Past MI and (-) Cardiac Stents Normal cardiovascular exam(-) dysrhythmias (-) Valvular Problems/Murmurs Rhythm:regular Rate:Normal     Neuro/Psych negative neurological ROS  negative psych ROS   GI/Hepatic Neg liver ROS, GERD  Medicated and Controlled,  Endo/Other  neg diabetesHypothyroidism   Renal/GU negative Renal ROS  negative genitourinary   Musculoskeletal   Abdominal   Peds  Hematology negative hematology ROS (+)   Anesthesia Other Findings Past Medical History: 2005: Adenomatous polyp No date: Arrhythmia     Comment:  H/O No date: Basal cell carcinoma No date: CTS (carpal tunnel syndrome) No date: Environmental allergies No date: History of chicken pox No date: HTN (hypertension) No date: Hypercholesterolemia No date: Hypothyroidism   Reproductive/Obstetrics negative OB ROS                             Anesthesia Physical Anesthesia Plan  ASA: II  Anesthesia Plan: General   Post-op Pain Management:    Induction: Intravenous  PONV Risk Score and Plan: 3 and TIVA and Propofol infusion  Airway Management Planned: Natural Airway and Nasal Cannula  Additional Equipment:   Intra-op Plan:   Post-operative Plan:   Informed Consent: I have reviewed the  patients History and Physical, chart, labs and discussed the procedure including the risks, benefits and alternatives for the proposed anesthesia with the patient or authorized representative who has indicated his/her understanding and acceptance.     Dental Advisory Given  Plan Discussed with: Anesthesiologist, CRNA and Surgeon  Anesthesia Plan Comments:         Anesthesia Quick Evaluation

## 2020-08-25 ENCOUNTER — Encounter: Payer: Self-pay | Admitting: Gastroenterology

## 2020-08-25 ENCOUNTER — Encounter: Payer: Self-pay | Admitting: Internal Medicine

## 2020-08-25 NOTE — Anesthesia Postprocedure Evaluation (Signed)
Anesthesia Post Note  Patient: [name] [name]  Procedure(s) Performed: COLONOSCOPY WITH PROPOFOL (N/A )  Patient location during evaluation: PACU Anesthesia Type: General Level of consciousness: awake and alert Pain management: pain level controlled Vital Signs Assessment: post-procedure vital signs reviewed and stable Respiratory status: spontaneous breathing, nonlabored ventilation, respiratory function stable and patient connected to nasal cannula oxygen Cardiovascular status: blood pressure returned to baseline and stable Postop Assessment: no apparent nausea or vomiting Anesthetic complications: no   No complications documented.   Last Vitals:  Vitals:   08/24/20 1030 08/24/20 1035  BP:  (!) 157/65  Pulse: (!) 53 (!) 55  Resp: 10 14  Temp:    SpO2: 100% 100%    Last Pain:  Vitals:   08/25/20 0723  TempSrc:   PainSc: 0-No pain                 Molli Barrows

## 2020-08-26 NOTE — Telephone Encounter (Signed)
Spoke with patient. She is not having symptoms and as of right now- [name] [name] is not having symptoms either. Advised it is too early to test if no symptoms. [name] [name] would like to hold off on virtual. She is going to call [name] [name]'s husband this morning and get an update then she will let me know.

## 2020-09-03 ENCOUNTER — Ambulatory Visit
Admission: RE | Admit: 2020-09-03 | Discharge: 2020-09-03 | Disposition: A | Discharge status: Home / Self Care | Payer: PPO | Source: Ambulatory Visit | Attending: Internal Medicine | Admitting: Internal Medicine

## 2020-09-03 ENCOUNTER — Other Ambulatory Visit: Payer: Self-pay

## 2020-09-03 DIAGNOSIS — Z1231 Encounter for screening mammogram for malignant neoplasm of breast: Secondary | ICD-10-CM | POA: Insufficient documentation

## 2020-09-08 ENCOUNTER — Telehealth: Payer: Self-pay

## 2020-09-08 NOTE — Telephone Encounter (Signed)
Pt's daughter called and said the caregiver [name] [name] told her that the pt's BP is elevated and heart rate is elevated. She is cold and can't get warm. Pt's daughter would like for you to call caregiver [name] [name].

## 2020-09-08 NOTE — Telephone Encounter (Signed)
Spoken to daughter but unable to reach care giver, was given the number [phone] by daughter to reach patient/caregiver.Phone is currently busy.

## 2020-09-08 NOTE — Telephone Encounter (Signed)
This should be in [facility] [birth year]31 chart. I did telephone encounter wrong. My apologies.

## 2020-09-24 ENCOUNTER — Other Ambulatory Visit: Payer: Self-pay

## 2020-09-24 ENCOUNTER — Encounter: Payer: Self-pay | Admitting: Gastroenterology

## 2020-09-24 ENCOUNTER — Ambulatory Visit: Payer: PPO | Admitting: Gastroenterology

## 2020-09-24 VITALS — BP 155/83 | HR 82 | Ht 63.0 in | Wt 145.2 lb

## 2020-09-24 DIAGNOSIS — K219 Gastro-esophageal reflux disease without esophagitis: Secondary | ICD-10-CM

## 2020-09-24 NOTE — Progress Notes (Signed)
[name] Darby, MD [address]  [place]  [place], [place] [address]  Main: [phone]  Fax: [phone]    Gastroenterology Consultation  Referring Provider:     Einar Pheasant, MD Primary Care Physician:  Einar Pheasant, MD Primary Gastroenterologist:  Dr. Cephas [name] Reason for Consultation:   Reflux        HPI:   [name] [name] is a 70 y.o. female referred by Dr. Einar Pheasant, MD  for consultation & management of heme positive stool.  Patient had history of adenomatous colon polyp, hypertension, hypercholesterolemia, hypothyroidism who underwent fecal occult test as a colon cancer screening on 03/03/2020.  It came back positive.  Therefore, referred to GI to discuss about colonoscopy.  Patient denies having any GI symptoms today.  She was taking ashwagandha to enhance her immune system, noticed that she developed severe constipation, therefore stopped using it.  She is no longer experiencing constipation Labs are unremarkable including CBC, CMP, TSH  Follow-up visit 09/24/20 Patient is here for follow-up of her reflux symptoms.  She reports that, since October she has been experiencing heartburn usually during the day.  She has gained about 5 pounds within last 3 months.  She has been less active.  She is able to go to gym.  She also reports that her weight gain is secondary to increased portion size of her meals.  She started taking omeprazole 20 mg daily which partially relieves her symptoms.  She denies any difficulty swallowing.  She does report epigastric burning pain.   NSAIDs: None  Antiplts/Anticoagulants/Anti thrombotics: None  GI Procedures: Colonoscopy in 07/26/2013 by Dr. Deatra Ina Found to have diverticulosis and external hemorrhoids only Upper endoscopy 07/28/2006 for dysphagia LA grade a esophagitis, dilation to 17 mm  Colonoscopy 08/24/2020 - The examined portion of the ileum was normal. - Non-bleeding external hemorrhoids. - The examination was  otherwise normal. - The entire examined colon is normal. - No specimens collected.  She denies family history of GI malignancy She does not smoke, occasional alcohol use    Past Medical History:  Diagnosis Date  . Adenomatous polyp 2005  . Arrhythmia    H/O  . Basal cell carcinoma   . CTS (carpal tunnel syndrome)   . Environmental allergies   . History of chicken pox   . HTN (hypertension)   . Hypercholesterolemia   . Hypothyroidism     Past Surgical History:  Procedure Laterality Date  . BASAL CELL CARCINOMA EXCISION     right hip,abd, right arm, back neck  . BREAST CYST ASPIRATION Left    negative  . COLONOSCOPY WITH PROPOFOL N/A 08/24/2020   Procedure: COLONOSCOPY WITH PROPOFOL;  Surgeon: Lin Landsman, MD;  Location: [facility] ENDOSCOPY;  Service: Gastroenterology;  Laterality: N/A;  . CTS releast Rt wrist    . lipoma removal     Rt shoulder   . MELANOMA EXCISION     right buttocks, top of scalp  . POLYPECTOMY     + TA  . TUBAL LIGATION  1982    Current Outpatient Medications:  .  cetirizine (ZYRTEC) 10 MG tablet, Take 10 mg by mouth daily., Disp: , Rfl:  .  cholecalciferol (VITAMIN D) 1000 units tablet, Take 1,000 Units by mouth daily., Disp: , Rfl:  .  Eszopiclone 3 MG TABS, Take 1 tablet (3 mg total) by mouth at bedtime., Disp: 30 tablet, Rfl: 1 .  irbesartan (AVAPRO) 75 MG tablet, Take 1 tablet (75 mg total) by  mouth daily., Disp: 90 tablet, Rfl: 1 .  metoCLOPramide (REGLAN) 5 MG tablet, Take 1 tablet (5 mg total) by mouth 3 (three) times daily before meals., Disp: 30 tablet, Rfl: 0 .  Multiple Vitamins-Minerals (HAIR SKIN AND NAILS FORMULA PO), Take 1 tablet by mouth daily., Disp: , Rfl:  .  Nutritional Supplements (NUTRITIONAL SUPPLEMENT PO), Take by mouth. DoTERRA: ALPHA CRS+, xEO MEGA, MICROPLEX MVp, ON GUARD (protective blend), Disp: , Rfl:  .  omeprazole (PRILOSEC) 20 MG capsule, Take 1 capsule (20 mg total) by mouth daily., Disp: 30 capsule, Rfl: 5 .   ondansetron (ZOFRAN ODT) 4 MG disintegrating tablet, Take 1 tablet (4 mg total) by mouth every 8 (eight) hours as needed for nausea or vomiting., Disp: 20 tablet, Rfl: 0 .  rosuvastatin (CRESTOR) 5 MG tablet, Take 1 tablet (5 mg total) by mouth daily., Disp: 30 tablet, Rfl: 5 .  SYNTHROID 175 MCG tablet, TAKE 1 TABLET EVERY DAY ON EMPTY STOMACHWITH A GLASS OF WATER AT LEAST 30-60 MINBEFORE BREAKFAST, Disp: 30 tablet, Rfl: 5   Family History  Problem Relation Age of Onset  . Cervical cancer Mother   . Arthritis Mother   . Hypertension Mother   . Sudden death Mother        cerbral hemorrage  . Heart disease Father   . Cancer Father        lung cancer  . Diabetes Maternal Grandmother   . Breast cancer Paternal Aunt 74  . Colon cancer Neg Hx      Social History   Tobacco Use  . Smoking status: Former Smoker    Types: Cigarettes    Quit date: 07/16/1980    Years since quitting: 40.2  . Smokeless tobacco: Never Used  Vaping Use  . Vaping Use: Never used  Substance Use Topics  . Alcohol use: Yes    Alcohol/week: 7.0 standard drinks    Types: 7 Glasses of wine per week    Comment: 1 per day   . Drug use: No    Allergies as of 09/24/2020 - Review Complete 09/24/2020  Allergen Reaction Noted  . Codeine    . Penicillins Hives 06/26/2017    Review of Systems:    All systems reviewed and negative except where noted in HPI.   Physical Exam:  BP (!) 155/83 (BP Location: Left Arm, Patient Position: Sitting, Cuff Size: Normal)   Pulse 82   Ht 5\' 3"  (1.6 m)   Wt 145 lb 3.2 oz (65.9 kg)   BMI 25.72 kg/m  No LMP recorded. Patient is postmenopausal.  General:   Alert,  Well-developed, well-nourished, pleasant and cooperative in NAD Head:  Normocephalic and atraumatic. Eyes:  Sclera clear, no icterus.   Conjunctiva pink. Ears:  Normal auditory acuity. Nose:  No deformity, discharge, or lesions. Mouth:  No deformity or lesions,oropharynx pink & moist. Neck:  Supple; no masses or  thyromegaly. Lungs:  Respirations even and unlabored.  Clear throughout to auscultation.   No wheezes, crackles, or rhonchi. No acute distress. Heart:  Regular rate and rhythm; no murmurs, clicks, rubs, or gallops. Abdomen:  Normal bowel sounds. Soft, non-tender and non-distended without masses, hepatosplenomegaly or hernias noted.  No guarding or rebound tenderness.   Rectal: Not performed Msk:  Symmetrical without gross deformities. Good, equal movement & strength bilaterally. Pulses:  Normal pulses noted. Extremities:  No clubbing or edema.  No cyanosis. Neurologic:  Alert and oriented x3;  grossly normal neurologically. Skin:  Intact without significant lesions  or rashes. No jaundice. Psych:  Alert and cooperative. Normal mood and affect.  Imaging Studies: No abdominal imaging  Assessment and Plan:   KAILIE POLUS is a 70 y.o. pleasant Caucasian female with history of hypothyroidism, hypertension, hypercholesterolemia is seen in consultation for chronic GERD.  Patient had known history of mild erosive esophagitis and dilation in the past  Chronic GERD Increase omeprazole to 20 twice daily or 40 mg daily Encouraged to go back to gym, cut back on carbohydrate intake If her symptoms are persistent, can do upper endoscopy   Follow up as needed   [name] Darby, MD

## 2020-10-13 ENCOUNTER — Encounter: Payer: Self-pay | Admitting: Internal Medicine

## 2020-10-29 DIAGNOSIS — H2513 Age-related nuclear cataract, bilateral: Secondary | ICD-10-CM | POA: Diagnosis not present

## 2020-10-30 ENCOUNTER — Other Ambulatory Visit (INDEPENDENT_AMBULATORY_CARE_PROVIDER_SITE_OTHER): Payer: PPO

## 2020-10-30 ENCOUNTER — Other Ambulatory Visit: Payer: Self-pay

## 2020-10-30 DIAGNOSIS — I10 Essential (primary) hypertension: Secondary | ICD-10-CM | POA: Diagnosis not present

## 2020-10-30 DIAGNOSIS — E785 Hyperlipidemia, unspecified: Secondary | ICD-10-CM

## 2020-10-30 LAB — BASIC METABOLIC PANEL
BUN: 18 mg/dL (ref 6–23)
CO2: 28 mEq/L (ref 19–32)
Calcium: 9.6 mg/dL (ref 8.4–10.5)
Chloride: 104 mEq/L (ref 96–112)
Creatinine, Ser: 0.69 mg/dL (ref 0.40–1.20)
GFR: 87.34 mL/min (ref >60.00)
Glucose, Bld: 91 mg/dL (ref 70–99)
Potassium: 4.5 mEq/L (ref 3.5–5.1)
Sodium: 139 mEq/L (ref 135–145)

## 2020-10-30 LAB — HEPATIC FUNCTION PANEL
ALT: 22 U/L (ref 0–35)
AST: 19 U/L (ref 0–37)
Albumin: 4.2 g/dL (ref 3.5–5.2)
Alkaline Phosphatase: 82 U/L (ref 39–117)
Bilirubin, Direct: 0.1 mg/dL (ref 0.0–0.3)
Total Bilirubin: 0.4 mg/dL (ref 0.2–1.2)
Total Protein: 6.2 g/dL (ref 6.0–8.3)

## 2020-10-30 LAB — LIPID PANEL
Cholesterol: 148 mg/dL (ref 0–200)
HDL: 62.6 mg/dL (ref >39.00)
LDL Cholesterol: 74 mg/dL (ref 0–99)
NonHDL: 85.46
Total CHOL/HDL Ratio: 2
Triglycerides: 57 mg/dL (ref 0.0–149.0)
VLDL: 11.4 mg/dL (ref 0.0–40.0)

## 2020-10-30 LAB — CBC WITH DIFFERENTIAL/PLATELET
Basophils Absolute: 0 10*3/uL (ref 0.0–0.1)
Basophils Relative: 0.6 % (ref 0.0–3.0)
Eosinophils Absolute: 1 10*3/uL — ABNORMAL HIGH (ref 0.0–0.7)
Eosinophils Relative: 14.1 % — ABNORMAL HIGH (ref 0.0–5.0)
HCT: 38.4 % (ref 36.0–46.0)
Hemoglobin: 13.2 g/dL (ref 12.0–15.0)
Lymphocytes Relative: 22.3 % (ref 12.0–46.0)
Lymphs Abs: 1.5 10*3/uL (ref 0.7–4.0)
MCHC: 34.3 g/dL (ref 30.0–36.0)
MCV: 86.6 fl (ref 78.0–100.0)
Monocytes Absolute: 0.5 10*3/uL (ref 0.1–1.0)
Monocytes Relative: 7.8 % (ref 3.0–12.0)
Neutro Abs: 3.7 10*3/uL (ref 1.4–7.7)
Neutrophils Relative %: 55.2 % (ref 43.0–77.0)
Platelets: 262 10*3/uL (ref 150.0–400.0)
RBC: 4.43 Mil/uL (ref 3.87–5.11)
RDW: 12.7 % (ref 11.5–15.5)
WBC: 6.8 10*3/uL (ref 4.0–10.5)

## 2020-11-03 ENCOUNTER — Encounter: Payer: Self-pay | Admitting: Internal Medicine

## 2020-11-03 ENCOUNTER — Ambulatory Visit (INDEPENDENT_AMBULATORY_CARE_PROVIDER_SITE_OTHER): Payer: PPO | Admitting: Internal Medicine

## 2020-11-03 ENCOUNTER — Other Ambulatory Visit: Payer: Self-pay

## 2020-11-03 VITALS — BP 122/72 | HR 97 | Temp 97.8°F | Ht 63.0 in | Wt 141.2 lb

## 2020-11-03 DIAGNOSIS — G479 Sleep disorder, unspecified: Secondary | ICD-10-CM | POA: Diagnosis not present

## 2020-11-03 DIAGNOSIS — R1310 Dysphagia, unspecified: Secondary | ICD-10-CM | POA: Diagnosis not present

## 2020-11-03 DIAGNOSIS — I10 Essential (primary) hypertension: Secondary | ICD-10-CM | POA: Diagnosis not present

## 2020-11-03 DIAGNOSIS — E039 Hypothyroidism, unspecified: Secondary | ICD-10-CM

## 2020-11-03 DIAGNOSIS — E785 Hyperlipidemia, unspecified: Secondary | ICD-10-CM

## 2020-11-03 DIAGNOSIS — Z0000 Encounter for general adult medical examination without abnormal findings: Secondary | ICD-10-CM | POA: Diagnosis not present

## 2020-11-03 MED ORDER — PANTOPRAZOLE SODIUM 40 MG PO TBEC: 40.0000 mg | DELAYED_RELEASE_TABLET | Freq: Every day | ORAL | 2 refills | Status: DC

## 2020-11-03 NOTE — Assessment & Plan Note (Signed)
Physical today 11/03/20.  Colonoscopy 08/24/20 - non bleeding external hemorrhoids.  Recommended f/u in 10 years. Mammogram 09/04/20 - Birads I.

## 2020-11-03 NOTE — Progress Notes (Signed)
Patient ID: [name] [name], female   DOB: 01/06/1951, 70 y.o.   MRN: [id number]   Subjective:    Patient ID: [name] [name], female    DOB: May 15, 1951, 70 y.o.   MRN: [id number]  HPI This visit occurred during the SARS-CoV-2 public health emergency.  Safety protocols were in place, including screening questions prior to the visit, additional usage of staff PPE, and extensive cleaning of exam room while observing appropriate contact time as indicated for disinfecting solutions.  Patient here for her physical exam.  She is doing relatively well.  Increased stress - family medical issues, etc.  Discussed.  Overall she feels she is handling things relatively well. Discussed finding time for herself.  Stays active.  No chest pain or sob reported.  No abdominal pain or bowel change reported.  She does report some dysphagia.  Also notices burning (acid reflux) - EtOH aggravates and chocolate aggravates.  Taking omeprazole.  Some fatigue.     Past Medical History:  Diagnosis Date  . Adenomatous polyp 2005  . Arrhythmia    H/O  . Basal cell carcinoma   . CTS (carpal tunnel syndrome)   . Environmental allergies   . History of chicken pox   . HTN (hypertension)   . Hypercholesterolemia   . Hypothyroidism    Past Surgical History:  Procedure Laterality Date  . BASAL CELL CARCINOMA EXCISION     right hip,abd, right arm, back neck  . BREAST CYST ASPIRATION Left    negative  . COLONOSCOPY WITH PROPOFOL N/A 08/24/2020   Procedure: COLONOSCOPY WITH PROPOFOL;  Surgeon: Lin Landsman, MD;  Location: [facility] ENDOSCOPY;  Service: Gastroenterology;  Laterality: N/A;  . CTS releast Rt wrist    . lipoma removal     Rt shoulder   . MELANOMA EXCISION     right buttocks, top of scalp  . POLYPECTOMY     + TA  . TUBAL LIGATION  1982   Family History  Problem Relation Age of Onset  . Cervical cancer Mother   . Arthritis Mother   . Hypertension Mother   . Sudden death Mother        cerbral hemorrage   . Heart disease Father   . Cancer Father        lung cancer  . Diabetes Maternal Grandmother   . Breast cancer Paternal Aunt 74  . Colon cancer Neg Hx    Social History   Socioeconomic History  . Marital status: Widowed    Spouse name: Not on file  . Number of children: 1  . Years of education: Not on file  . Highest education level: Not on file  Occupational History  . 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 Smoking status: Former Smoker    Types: Cigarettes    Quit date: 07/16/1980    Years since quitting: 40.3  . Smokeless tobacco: Never Used  Vaping Use  . Vaping Use: Never used  Substance and Sexual Activity  . Alcohol use: Yes    Alcohol/week: 7.0 standard drinks    Types: 7 Glasses of wine per week    Comment: 1 per day   . Drug use: No  . Sexual activity: Never  Other Topics Concern  . Not on file  Social History Narrative   Widowed; 1 son.    Retired Art therapist   Daily caffeine use 2-3/day       Cell # [id number]  Social Determinants of Health   Financial Resource Strain: Not on file  Food Insecurity: No Food Insecurity  . Worried About Charity fundraiser in the Last Year: Never true  . Ran Out of Food in the Last Year: Never true  Transportation Needs: Not on file  Physical Activity: Not on file  Stress: Not on file  Social Connections: Unknown  . Frequency of Communication with Friends and Family: More than three times a week  . Frequency of Social Gatherings with Friends and Family: More than three times a week  . Attends Religious Services: Not on file  . Active Member of Clubs or Organizations: Not on file  . Attends Archivist Meetings: Not on file  . Marital Status: Not on file    Outpatient Encounter Medications as of 11/03/2020  Medication Sig  . cetirizine (ZYRTEC) 10 MG tablet Take 10 mg by mouth daily.  . cholecalciferol (VITAMIN D) 1000 units tablet Take 1,000 Units by mouth  daily.  . Eszopiclone 3 MG TABS Take 1 tablet (3 mg total) by mouth at bedtime.  . irbesartan (AVAPRO) 75 MG tablet Take 1 tablet (75 mg total) by mouth daily.  . metoCLOPramide (REGLAN) 5 MG tablet Take 1 tablet (5 mg total) by mouth 3 (three) times daily before meals.  . Multiple Vitamins-Minerals (HAIR SKIN AND NAILS FORMULA PO) Take 1 tablet by mouth daily.  . Nutritional Supplements (NUTRITIONAL SUPPLEMENT PO) Take by mouth. DoTERRA: ALPHA CRS+, xEO MEGA, MICROPLEX MVp, ON GUARD (protective blend)  . ondansetron (ZOFRAN ODT) 4 MG disintegrating tablet Take 1 tablet (4 mg total) by mouth every 8 (eight) hours as needed for nausea or vomiting.  . pantoprazole (PROTONIX) 40 MG tablet Take 1 tablet (40 mg total) by mouth daily.  . rosuvastatin (CRESTOR) 5 MG tablet Take 1 tablet (5 mg total) by mouth daily.  Marland Kitchen SYNTHROID 175 MCG tablet TAKE 1 TABLET EVERY DAY ON EMPTY STOMACHWITH A GLASS OF WATER AT LEAST 30-60 MINBEFORE BREAKFAST  . [DISCONTINUED] omeprazole (PRILOSEC) 20 MG capsule Take 1 capsule (20 mg total) by mouth daily.   No facility-administered encounter medications on file as of 11/03/2020.    Review of Systems  Constitutional: Negative for appetite change and unexpected weight change.  HENT: Negative for congestion, sinus pressure and sore throat.   Eyes: Negative for pain and visual disturbance.  Respiratory: Negative for cough, chest tightness and shortness of breath.   Cardiovascular: Negative for chest pain, palpitations and leg swelling.  Gastrointestinal: Negative for abdominal pain, diarrhea, nausea and vomiting.       Acid reflux/burning and dysphagia as outlined.   Genitourinary: Negative for difficulty urinating and dysuria.  Musculoskeletal: Negative for joint swelling and myalgias.  Skin: Negative for color change and rash.  Neurological: Negative for dizziness, light-headedness and headaches.  Hematological: Negative for adenopathy. Does not bruise/bleed easily.   Psychiatric/Behavioral: Negative for agitation and dysphoric mood.       Objective:    Physical Exam Vitals reviewed.  Constitutional:      General: She is not in acute distress.    Appearance: Normal appearance. She is well-developed.  HENT:     Head: Normocephalic and atraumatic.     Right Ear: External ear normal.     Left Ear: External ear normal.  Eyes:     General: No scleral icterus.       Right eye: No discharge.        Left eye: No discharge.  Conjunctiva/sclera: Conjunctivae normal.  Neck:     Thyroid: No thyromegaly.  Cardiovascular:     Rate and Rhythm: Normal rate and regular rhythm.  Pulmonary:     Effort: No tachypnea, accessory muscle usage or respiratory distress.     Breath sounds: Normal breath sounds. No decreased breath sounds or wheezing.  Chest:  Breasts:     Right: No inverted nipple, mass, nipple discharge or tenderness (no axillary adenopathy).     Left: No inverted nipple, mass, nipple discharge or tenderness (no axilarry adenopathy).    Abdominal:     General: Bowel sounds are normal.     Palpations: Abdomen is soft.     Tenderness: There is no abdominal tenderness.  Musculoskeletal:        General: No swelling or tenderness.     Cervical back: Neck supple. No tenderness.  Lymphadenopathy:     Cervical: No cervical adenopathy.  Skin:    Findings: No erythema or rash.  Neurological:     Mental Status: She is alert and oriented to person, place, and time.  Psychiatric:        Mood and Affect: Mood normal.        Behavior: Behavior normal.     BP 122/72   Pulse 97   Temp 97.8 F (36.6 C)   Ht 5\' 3"  (1.6 m)   Wt 141 lb 4 oz (64.1 kg)   SpO2 94%   BMI 25.02 kg/m  Wt Readings from Last 3 Encounters:  11/03/20 141 lb 4 oz (64.1 kg)  09/24/20 145 lb 3.2 oz (65.9 kg)  08/24/20 140 lb (63.5 kg)     Lab Results  Component Value Date   WBC 6.8 10/30/2020   HGB 13.2 10/30/2020   HCT 38.4 10/30/2020   PLT 262.0 10/30/2020    GLUCOSE 91 10/30/2020   CHOL 148 10/30/2020   TRIG 57.0 10/30/2020   HDL 62.60 10/30/2020   LDLDIRECT 118.2 10/16/2006   LDLCALC 74 10/30/2020   ALT 22 10/30/2020   AST 19 10/30/2020   NA 139 10/30/2020   K 4.5 10/30/2020   CL 104 10/30/2020   CREATININE 0.69 10/30/2020   BUN 18 10/30/2020   CO2 28 10/30/2020   TSH 0.63 07/03/2020    MM 3D SCREEN BREAST BILATERAL  Result Date: 09/04/2020 CLINICAL DATA:  Screening. EXAM: DIGITAL SCREENING BILATERAL MAMMOGRAM WITH TOMO AND CAD COMPARISON:  Previous exam(s). ACR Breast Density Category c: The breast tissue is heterogeneously dense, which may obscure small masses. FINDINGS: There are no findings suspicious for malignancy. The images were evaluated with computer-aided detection. IMPRESSION: No mammographic evidence of malignancy. A result letter of this screening mammogram will be mailed directly to the patient. RECOMMENDATION: Screening mammogram in one year. (Code:SM-B-01Y) BI-RADS CATEGORY  1: Negative. Electronically Signed   By: Lillia Mountain M.D.   On: 09/04/2020 10:14       Assessment & Plan:   Problem List Items Addressed This Visit    Dysphagia    Dysphagia as outlined.  Acid reflux and burning.  On omeprazole bid.  Change to protonix.  Follow symptoms.  Refer to GI given persistent symptoms and dysphagia for question of need for EGD.        Essential hypertension    Blood pressure on recheck wnl.  Continue avapro.  Follow pressures.  Follow metabolic panel.       Relevant Orders   Basic metabolic panel   Health care maintenance    Physical today  11/03/20.  Colonoscopy 08/24/20 - non bleeding external hemorrhoids.  Recommended f/u in 10 years. Mammogram 09/04/20 - Birads I.       Hyperlipidemia    On crestor.  Low cholesterol diet and exercise.  Follow lipid panel and liver function tests.        Relevant Orders   Lipid panel   Hepatic function panel   Hypothyroidism    On thyroid replacement.  Follow tsh.        Relevant Orders   TSH   Sleeping difficulty    Uses lunesta.         Other Visit Diagnoses    Routine general medical examination at a health care facility    -  Primary       Einar Pheasant, MD

## 2020-11-08 ENCOUNTER — Telehealth: Payer: Self-pay | Admitting: Internal Medicine

## 2020-11-08 ENCOUNTER — Encounter: Payer: Self-pay | Admitting: Internal Medicine

## 2020-11-08 NOTE — Assessment & Plan Note (Signed)
Blood pressure on recheck wnl.  Continue avapro.  Follow pressures.  Follow metabolic panel.

## 2020-11-08 NOTE — Assessment & Plan Note (Signed)
Uses lunesta.

## 2020-11-08 NOTE — Assessment & Plan Note (Signed)
Dysphagia as outlined.  Acid reflux and burning.  On omeprazole bid.  Change to protonix.  Follow symptoms.  Refer to GI given persistent symptoms and dysphagia for question of need for EGD.

## 2020-11-08 NOTE — Assessment & Plan Note (Signed)
On thyroid replacement.  Follow tsh.  

## 2020-11-08 NOTE — Telephone Encounter (Signed)
[name] [name] has seen Dr Marius Ditch and had colonoscopy.  She is now having dysphagia and persistent acid reflux (despite medication).  Needs f/u with Dr Marius Ditch for further evaluation and question of need for EGD.  It has not been long since she saw Dr Marius Ditch, but this is for a new problem.  Do I need to place order for a new referral.  Just let me know.    Thanks.   Dr Nicki Reaper

## 2020-11-08 NOTE — Assessment & Plan Note (Signed)
On crestor.  Low cholesterol diet and exercise.  Follow lipid panel and liver function tests.   

## 2020-11-09 NOTE — Telephone Encounter (Signed)
Good afternoon!  I called pt to verify the last time she had a appt at Dr Marius Ditch office. Pt states she just saw Dr Marius Ditch in February, pt will call to schedule. If referral is needed pt will reach out to [place].   YW!

## 2020-12-14 ENCOUNTER — Other Ambulatory Visit: Payer: Self-pay | Admitting: Internal Medicine

## 2020-12-14 NOTE — Telephone Encounter (Signed)
rx sent in for lunesta #30 with one refill.  

## 2020-12-14 NOTE — Telephone Encounter (Signed)
RX Refill: Lunesta Last Seen:11-03-20 Last ordered:07-23-20

## 2020-12-15 ENCOUNTER — Encounter: Payer: Self-pay | Admitting: Internal Medicine

## 2020-12-15 DIAGNOSIS — R200 Anesthesia of skin: Secondary | ICD-10-CM

## 2020-12-15 NOTE — Telephone Encounter (Signed)
Order placed for ortho referral.   

## 2020-12-17 ENCOUNTER — Ambulatory Visit: Payer: PPO | Admitting: Gastroenterology

## 2020-12-18 ENCOUNTER — Other Ambulatory Visit: Payer: Self-pay | Admitting: Internal Medicine

## 2021-01-05 DIAGNOSIS — M79642 Pain in left hand: Secondary | ICD-10-CM | POA: Diagnosis not present

## 2021-01-05 DIAGNOSIS — G5602 Carpal tunnel syndrome, left upper limb: Secondary | ICD-10-CM | POA: Diagnosis not present

## 2021-01-25 ENCOUNTER — Other Ambulatory Visit: Payer: Self-pay | Admitting: Internal Medicine

## 2021-01-28 ENCOUNTER — Other Ambulatory Visit: Payer: Self-pay | Admitting: Internal Medicine

## 2021-01-29 NOTE — Telephone Encounter (Signed)
Rx ok'd for lunesta 330 with one refill.

## 2021-02-01 ENCOUNTER — Other Ambulatory Visit: Payer: Self-pay | Admitting: Internal Medicine

## 2021-02-02 ENCOUNTER — Telehealth (INDEPENDENT_AMBULATORY_CARE_PROVIDER_SITE_OTHER): Payer: PPO | Admitting: Internal Medicine

## 2021-02-02 ENCOUNTER — Other Ambulatory Visit: Payer: Self-pay

## 2021-02-02 ENCOUNTER — Encounter: Payer: Self-pay | Admitting: Internal Medicine

## 2021-02-02 DIAGNOSIS — I10 Essential (primary) hypertension: Secondary | ICD-10-CM

## 2021-02-02 DIAGNOSIS — U071 COVID-19: Secondary | ICD-10-CM

## 2021-02-02 MED ORDER — MOLNUPIRAVIR EUA 200MG CAPSULE: 4.0000 | ORAL_CAPSULE | Freq: Two times a day (BID) | ORAL | 0 refills | Status: AC

## 2021-02-02 NOTE — Telephone Encounter (Signed)
Appt scheduled with Dr Nicki Reaper to discuss

## 2021-02-02 NOTE — Progress Notes (Signed)
Patient ID: [name] [name], female   DOB: Aug 25, 1950, 70 y.o.   MRN: [id number]   Virtual Visit via video Note  This visit type was conducted due to national recommendations for restrictions regarding the COVID-19 pandemic (e.g. social distancing).  This format is felt to be most appropriate for this patient at this time.  All issues noted in this document were discussed and addressed.  No physical exam was performed (except for noted visual exam findings with Video Visits).   I connected with [name] [name] today by a video enabled telemedicine application or telephone and verified that I am speaking with the correct person using two identifiers. Location patient: home Location provider: work or home office Persons participating in the virtual visit: patient, provider  The limitations, risks, security and privacy concerns of performing an evaluation and management service by video and the availability of in person appointments have been discussed.  It has also been discussed with the patient that there may be a patient responsible charge related to this service. The patient expressed understanding and agreed to proceed.   Reason for visit: work in appt.    HPI: Work in - covid positive.  Reports that symptoms started yesterday.  She COVID tested yesterday and it was positive.  Reports some increased drainage.  Minimal cough.  Some maxillary sinus tenderness.  Noticed aching in her joints.  Some chills.  Initially noticed a little scratchy throat.  No chest congestion chest pain or shortness of breath.  No chest tightness.  No nausea or vomiting.  Minimal diarrhea.  Has been using Astelin and Nasacort.  Taking Zyrtec.  T-max 100.    ROS: See pertinent positives and negatives per HPI.  Past Medical History:  Diagnosis Date   Adenomatous polyp 2005   Arrhythmia    H/O   Basal cell carcinoma    CTS (carpal tunnel syndrome)    Environmental allergies    History of chicken pox    HTN  (hypertension)    Hypercholesterolemia    Hypothyroidism     Past Surgical History:  Procedure Laterality Date   BASAL CELL CARCINOMA EXCISION     right hip,abd, right arm, back neck   BREAST CYST ASPIRATION Left    negative   COLONOSCOPY WITH PROPOFOL N/A 08/24/2020   Procedure: COLONOSCOPY WITH PROPOFOL;  Surgeon: Lin Landsman, MD;  Location: [facility];  Service: Gastroenterology;  Laterality: N/A;   CTS releast Rt wrist     lipoma removal     Rt shoulder    MELANOMA EXCISION     right buttocks, top of scalp   POLYPECTOMY     + TA   TUBAL LIGATION  1982    Family History  Problem Relation Age of Onset   Cervical cancer Mother    Arthritis Mother    Hypertension Mother    Sudden death Mother        cerbral hemorrage   Heart disease Father    Cancer Father        lung cancer   Diabetes Maternal Grandmother    Breast cancer Paternal Aunt 74   Colon cancer Neg Hx     SOCIAL HX: Reviewed   Current Outpatient Medications:    cetirizine (ZYRTEC) 10 MG tablet, Take 10 mg by mouth daily., Disp: , Rfl:    cholecalciferol (VITAMIN D) 1000 units tablet, Take 1,000 Units by mouth daily., Disp: , Rfl:    Eszopiclone 3 MG TABS, TAKE ONE TABLET BY MOUTH  IMMEDIATELY BEFORE BEDTIME, Disp: 30 tablet, Rfl: 1   irbesartan (AVAPRO) 75 MG tablet, TAKE ONE TABLET BY MOUTH EVERY DAY, Disp: 90 tablet, Rfl: 1   metoCLOPramide (REGLAN) 5 MG tablet, Take 1 tablet (5 mg total) by mouth 3 (three) times daily before meals., Disp: 30 tablet, Rfl: 0   Multiple Vitamins-Minerals (HAIR SKIN AND NAILS FORMULA PO), Take 1 tablet by mouth daily., Disp: , Rfl:    Nutritional Supplements (NUTRITIONAL SUPPLEMENT PO), Take by mouth. DoTERRA: ALPHA CRS+, xEO MEGA, MICROPLEX MVp, ON GUARD (protective blend), Disp: , Rfl:    ondansetron (ZOFRAN ODT) 4 MG disintegrating tablet, Take 1 tablet (4 mg total) by mouth every 8 (eight) hours as needed for nausea or vomiting., Disp: 20 tablet, Rfl: 0    pantoprazole (PROTONIX) 40 MG tablet, TAKE 1 TABLET BY MOUTH ONCE DAILY, Disp: 30 tablet, Rfl: 2   rosuvastatin (CRESTOR) 5 MG tablet, TAKE ONE TABLET EVERY DAY, Disp: 30 tablet, Rfl: 5   SYNTHROID 175 MCG tablet, TAKE 1 TABLET EVERY MORNING WITH A GLASSOF WATER 30 TO 60 MINUTES BEFORE BREAKFAST, Disp: 30 tablet, Rfl: 5  EXAM:  GENERAL: alert, oriented, appears well and in no acute distress  HEENT: atraumatic, conjunttiva clear, no obvious abnormalities on inspection of external nose and ears  NECK: normal movements of the head and neck  LUNGS: on inspection no signs of respiratory distress, breathing rate appears normal, no obvious gross SOB, gasping or wheezing  CV: no obvious cyanosis  PSYCH/NEURO: pleasant and cooperative, no obvious depression or anxiety, speech and thought processing grossly intact  ASSESSMENT AND PLAN:  Discussed the following assessment and plan:  Problem List Items Addressed This Visit     COVID-19 virus infection    Noticed a low scratchy throat 01/31/21.  Symptoms started yesterday.  She tested positive for COVID yesterday.  No chest pain, chest tightness or shortness of breath.  Does have increased sinus congestion and drainage.  Continue Astelin and Nasacort.  Discussed Mucinex/Robitussin.  Discussed oral antiviral medication.  FDA has approved for emergency use authorization.  Prescription sent in for molnupiravir.  Discussed quarantine guidelines.  Rest and fluids.  Call with update.       Essential hypertension    Monitor blood pressure.  Continue Avapro.        Return if symptoms worsen or fail to improve.   I discussed the assessment and treatment plan with the patient. The patient was provided an opportunity to ask questions and all were answered. The patient agreed with the plan and demonstrated an understanding of the instructions.   The patient was advised to call back or seek an in-person evaluation if the symptoms worsen or if the  condition fails to improve as anticipated.   Einar Pheasant, MD

## 2021-02-02 NOTE — Telephone Encounter (Signed)
Phone message sent to [name] [name] for update.

## 2021-02-04 ENCOUNTER — Telehealth: Payer: Self-pay | Admitting: Internal Medicine

## 2021-02-04 NOTE — Telephone Encounter (Signed)
My chart message sent to pt for update.   

## 2021-02-04 NOTE — Telephone Encounter (Signed)
Please call Ms [name] [name].  Reviewed her my chart message.  I can send in a new rx for zofran if needed.  Also, confirm she is able to eat and drink.  Regarding testing, some patients tests have remained positive for a few weeks.  It varies.

## 2021-02-05 NOTE — Telephone Encounter (Signed)
Patient declines new rx as she has some Zofran at home. States the nausea is much better. Patient states she is able to eat and drink. Patient verbalized understanding, states she tested positive again this morning but is having no symptoms.

## 2021-02-06 ENCOUNTER — Encounter: Payer: Self-pay | Admitting: Internal Medicine

## 2021-02-08 ENCOUNTER — Encounter: Payer: Self-pay | Admitting: Internal Medicine

## 2021-02-08 DIAGNOSIS — U071 COVID-19: Secondary | ICD-10-CM | POA: Insufficient documentation

## 2021-02-08 NOTE — Assessment & Plan Note (Signed)
Monitor blood pressure.  Continue Avapro.

## 2021-02-08 NOTE — Assessment & Plan Note (Signed)
Noticed a low scratchy throat 01/31/21.  Symptoms started yesterday.  She tested positive for COVID yesterday.  No chest pain, chest tightness or shortness of breath.  Does have increased sinus congestion and drainage.  Continue Astelin and Nasacort.  Discussed Mucinex/Robitussin.  Discussed oral antiviral medication.  FDA has approved for emergency use authorization.  Prescription sent in for molnupiravir.  Discussed quarantine guidelines.  Rest and fluids.  Call with update.

## 2021-02-24 ENCOUNTER — Ambulatory Visit: Payer: PPO

## 2021-03-03 ENCOUNTER — Ambulatory Visit: Payer: PPO | Admitting: Podiatry

## 2021-03-09 ENCOUNTER — Other Ambulatory Visit: Payer: Self-pay

## 2021-03-09 ENCOUNTER — Other Ambulatory Visit (INDEPENDENT_AMBULATORY_CARE_PROVIDER_SITE_OTHER): Payer: PPO

## 2021-03-09 DIAGNOSIS — I10 Essential (primary) hypertension: Secondary | ICD-10-CM | POA: Diagnosis not present

## 2021-03-09 DIAGNOSIS — E785 Hyperlipidemia, unspecified: Secondary | ICD-10-CM | POA: Diagnosis not present

## 2021-03-09 DIAGNOSIS — E039 Hypothyroidism, unspecified: Secondary | ICD-10-CM

## 2021-03-09 LAB — LIPID PANEL
Cholesterol: 221 mg/dL — ABNORMAL HIGH (ref 0–200)
HDL: 81.7 mg/dL (ref >39.00)
LDL Cholesterol: 123 mg/dL — ABNORMAL HIGH (ref 0–99)
NonHDL: 139.09
Total CHOL/HDL Ratio: 3
Triglycerides: 80 mg/dL (ref 0.0–149.0)
VLDL: 16 mg/dL (ref 0.0–40.0)

## 2021-03-09 LAB — HEPATIC FUNCTION PANEL
ALT: 20 U/L (ref 0–35)
AST: 16 U/L (ref 0–37)
Albumin: 4.4 g/dL (ref 3.5–5.2)
Alkaline Phosphatase: 77 U/L (ref 39–117)
Bilirubin, Direct: 0.1 mg/dL (ref 0.0–0.3)
Total Bilirubin: 0.5 mg/dL (ref 0.2–1.2)
Total Protein: 6.2 g/dL (ref 6.0–8.3)

## 2021-03-09 LAB — BASIC METABOLIC PANEL
BUN: 28 mg/dL — ABNORMAL HIGH (ref 6–23)
CO2: 30 mEq/L (ref 19–32)
Calcium: 9.4 mg/dL (ref 8.4–10.5)
Chloride: 103 mEq/L (ref 96–112)
Creatinine, Ser: 0.65 mg/dL (ref 0.40–1.20)
GFR: 88.39 mL/min (ref >60.00)
Glucose, Bld: 90 mg/dL (ref 70–99)
Potassium: 4.3 mEq/L (ref 3.5–5.1)
Sodium: 140 mEq/L (ref 135–145)

## 2021-03-09 LAB — TSH: TSH: 0.77 u[IU]/mL (ref 0.35–5.50)

## 2021-03-11 ENCOUNTER — Other Ambulatory Visit: Payer: Self-pay

## 2021-03-11 ENCOUNTER — Ambulatory Visit (INDEPENDENT_AMBULATORY_CARE_PROVIDER_SITE_OTHER): Payer: PPO | Admitting: Internal Medicine

## 2021-03-11 DIAGNOSIS — Z8616 Personal history of COVID-19: Secondary | ICD-10-CM | POA: Diagnosis not present

## 2021-03-11 DIAGNOSIS — Z86010 Personal history of colonic polyps: Secondary | ICD-10-CM

## 2021-03-11 DIAGNOSIS — D721 Eosinophilia, unspecified: Secondary | ICD-10-CM | POA: Diagnosis not present

## 2021-03-11 DIAGNOSIS — E785 Hyperlipidemia, unspecified: Secondary | ICD-10-CM | POA: Diagnosis not present

## 2021-03-11 DIAGNOSIS — I10 Essential (primary) hypertension: Secondary | ICD-10-CM | POA: Diagnosis not present

## 2021-03-11 DIAGNOSIS — G479 Sleep disorder, unspecified: Secondary | ICD-10-CM

## 2021-03-11 DIAGNOSIS — E039 Hypothyroidism, unspecified: Secondary | ICD-10-CM | POA: Diagnosis not present

## 2021-03-11 MED ORDER — ESZOPICLONE 3 MG PO TABS: ORAL_TABLET | ORAL | 1 refills | Status: DC

## 2021-03-11 NOTE — Progress Notes (Signed)
Patient ID: [name] [name], female   DOB: 1951/03/03, 70 y.o.   MRN: [id number]   Subjective:    Patient ID: [name] [name], female    DOB: [birth year], 70 y.o.   MRN: [id number]  HPI This visit occurred during the SARS-CoV-2 public health emergency.  Safety protocols were in place, including screening questions prior to the visit, additional usage of staff PPE, and extensive cleaning of exam room while observing appropriate contact time as indicated for disinfecting solutions.   Patient here for a scheduled follow up.  Here to follow up regarding her cholesterol and blood pressure.  Recently diagnosed with covid (6//21/22).  Doing well post covid.  No cough or congestion.  No chest pain or sob. Has noticed some joint aches - intermittent - hands, knees - since covid.  Takes occasional advil. Stays active.  No acid reflux reported.  No abdominal pain.  Bowels moving.  Increased stress.  Discussed.  Overall appears to be handling things well.    Past Medical History:  Diagnosis Date   Adenomatous polyp 2005   Arrhythmia    H/O   Basal cell carcinoma    CTS (carpal tunnel syndrome)    Environmental allergies    History of chicken pox    HTN (hypertension)    Hypercholesterolemia    Hypothyroidism    Past Surgical History:  Procedure Laterality Date   BASAL CELL CARCINOMA EXCISION     right hip,abd, right arm, back neck   BREAST CYST ASPIRATION Left    negative   COLONOSCOPY WITH PROPOFOL N/A 08/24/2020   Procedure: COLONOSCOPY WITH PROPOFOL;  Surgeon: Lin Landsman, MD;  Location: [facility];  Service: Gastroenterology;  Laterality: N/A;   CTS releast Rt wrist     lipoma removal     Rt shoulder    MELANOMA EXCISION     right buttocks, top of scalp   POLYPECTOMY     + TA   TUBAL LIGATION  1982   Family History  Problem Relation Age of Onset   Cervical cancer Mother    Arthritis Mother    Hypertension Mother    Sudden death Mother        cerbral hemorrage   Heart  disease Father    Cancer Father        lung cancer   Diabetes Maternal Grandmother    Breast cancer Paternal Aunt 64   Colon cancer Neg Hx    Social History   Socioeconomic History   Marital status: Widowed    Spouse name: Not on file   Number of children: 1   Years of education: Not on file   Highest education level: Not on file  Occupational History   Occupation: [employer]/ Ortho     Employer: [employer]  Tobacco Use   Smoking status: Former    Types: Cigarettes    Quit date: 07/16/1980    Years since quitting: 40.6   Smokeless tobacco: Never  Vaping Use   Vaping Use: Never used  Substance and Sexual Activity   Alcohol use: Yes    Alcohol/week: 7.0 standard drinks    Types: 7 Glasses of wine per week    Comment: 1 per day    Drug use: No   Sexual activity: Never  Other Topics Concern   Not on file  Social History Narrative   Widowed; 1 son.    Retired Art therapist   Daily caffeine use 2-3/day  Cell # [phone]   Social Determinants of Health   Financial Resource Strain: Not on file  Food Insecurity: Not on file  Transportation Needs: Not on file  Physical Activity: Not on file  Stress: Not on file  Social Connections: Not on file    Review of Systems  Constitutional:  Negative for appetite change and unexpected weight change.  HENT:  Negative for congestion and sinus pressure.   Respiratory:  Negative for cough, chest tightness and shortness of breath.   Cardiovascular:  Negative for chest pain, palpitations and leg swelling.  Gastrointestinal:  Negative for abdominal pain, diarrhea, nausea and vomiting.  Genitourinary:  Negative for difficulty urinating and dysuria.  Musculoskeletal:  Negative for myalgias.       Joint aches and stiffness as outlined.   Skin:  Negative for color change and rash.  Neurological:  Negative for dizziness, light-headedness and headaches.  Psychiatric/Behavioral:  Negative for agitation and dysphoric  mood.       Objective:    Physical Exam Vitals reviewed.  Constitutional:      General: She is not in acute distress.    Appearance: Normal appearance.  HENT:     Head: Normocephalic and atraumatic.     Right Ear: External ear normal.     Left Ear: External ear normal.  Eyes:     General: No scleral icterus.       Right eye: No discharge.        Left eye: No discharge.     Conjunctiva/sclera: Conjunctivae normal.  Neck:     Thyroid: No thyromegaly.  Cardiovascular:     Rate and Rhythm: Normal rate and regular rhythm.  Pulmonary:     Effort: No respiratory distress.     Breath sounds: Normal breath sounds. No wheezing.  Abdominal:     General: Bowel sounds are normal.     Palpations: Abdomen is soft.     Tenderness: There is no abdominal tenderness.  Musculoskeletal:        General: No swelling or tenderness.     Cervical back: Neck supple. No tenderness.  Lymphadenopathy:     Cervical: No cervical adenopathy.  Skin:    Findings: No erythema or rash.  Neurological:     Mental Status: She is alert.  Psychiatric:        Mood and Affect: Mood normal.        Behavior: Behavior normal.    BP 120/68   Pulse 75   Temp 97.9 F (36.6 C)   Resp 16   Ht '5\' 2"'$  (1.575 m)   Wt 146 lb 12.8 oz (66.6 kg)   SpO2 98%   BMI 26.85 kg/m  Wt Readings from Last 3 Encounters:  03/11/21 146 lb 12.8 oz (66.6 kg)  02/02/21 142 lb 4.8 oz (64.5 kg)  11/03/20 141 lb 4 oz (64.1 kg)    Outpatient Encounter Medications as of 03/11/2021  Medication Sig   cetirizine (ZYRTEC) 10 MG tablet Take 10 mg by mouth daily.   cholecalciferol (VITAMIN D) 1000 units tablet Take 1,000 Units by mouth daily.   Eszopiclone 3 MG TABS TAKE ONE TABLET BY MOUTH IMMEDIATELY BEFORE BEDTIME   irbesartan (AVAPRO) 75 MG tablet TAKE ONE TABLET BY MOUTH EVERY DAY   metoCLOPramide (REGLAN) 5 MG tablet Take 1 tablet (5 mg total) by mouth 3 (three) times daily before meals.   Multiple Vitamins-Minerals (HAIR SKIN AND  NAILS FORMULA PO) Take 1 tablet by mouth daily.   Nutritional  Supplements (NUTRITIONAL SUPPLEMENT PO) Take by mouth. DoTERRA: ALPHA CRS+, xEO MEGA, MICROPLEX MVp, ON GUARD (protective blend)   ondansetron (ZOFRAN ODT) 4 MG disintegrating tablet Take 1 tablet (4 mg total) by mouth every 8 (eight) hours as needed for nausea or vomiting.   pantoprazole (PROTONIX) 40 MG tablet TAKE 1 TABLET BY MOUTH ONCE DAILY   rosuvastatin (CRESTOR) 5 MG tablet TAKE ONE TABLET EVERY DAY   SYNTHROID 175 MCG tablet TAKE 1 TABLET EVERY MORNING WITH A GLASSOF WATER 30 TO 60 MINUTES BEFORE BREAKFAST   [DISCONTINUED] Eszopiclone 3 MG TABS TAKE ONE TABLET BY MOUTH IMMEDIATELY BEFORE BEDTIME   No facility-administered encounter medications on file as of 03/11/2021.     Lab Results  Component Value Date   WBC 6.8 10/30/2020   HGB 13.2 10/30/2020   HCT 38.4 10/30/2020   PLT 262.0 10/30/2020   GLUCOSE 90 03/09/2021   CHOL 221 (H) 03/09/2021   TRIG 80.0 03/09/2021   HDL 81.70 03/09/2021   LDLDIRECT 118.2 10/16/2006   LDLCALC 123 (H) 03/09/2021   ALT 20 03/09/2021   AST 16 03/09/2021   NA 140 03/09/2021   K 4.3 03/09/2021   CL 103 03/09/2021   CREATININE 0.65 03/09/2021   BUN 28 (H) 03/09/2021   CO2 30 03/09/2021   TSH 0.77 03/09/2021    MM 3D SCREEN BREAST BILATERAL  Result Date: 09/04/2020 CLINICAL DATA:  Screening. EXAM: DIGITAL SCREENING BILATERAL MAMMOGRAM WITH TOMO AND CAD COMPARISON:  Previous exam(s). ACR Breast Density Category c: The breast tissue is heterogeneously dense, which may obscure small masses. FINDINGS: There are no findings suspicious for malignancy. The images were evaluated with computer-aided detection. IMPRESSION: No mammographic evidence of malignancy. A result letter of this screening mammogram will be mailed directly to the patient. RECOMMENDATION: Screening mammogram in one year. (Code:SM-B-01Y) BI-RADS CATEGORY  1: Negative. Electronically Signed   By: Lillia Mountain M.D.   On:  09/04/2020 10:14       Assessment & Plan:   Problem List Items Addressed This Visit     Eosinophilia    Recheck cbc next labs.  (elevated last check).  Recheck to confirm normal.        Relevant Orders   CBC with Differential/Platelet   Essential hypertension    Blood pressure as outlined.  128/68 on recheck.  Doing well.  Continue avapro..         Relevant Orders   Basic metabolic panel   History of colonic polyps    Colonoscopy 08/24/20 - recommended f/u in 10 years.        History of COVID-19    Overall doing well post covid.  Some occasional joint stiffness.  No cough or sob.  Follow.        Hyperlipidemia    On crestor.  Low cholesterol diet and exercise.  Follow lipid panel and liver function tests.         Relevant Orders   Hepatic function panel   Lipid panel   Hypothyroidism    On thyroid replacement.  Follow tsh.        Sleeping difficulty    Takes lunesta.  Follow. Stable.          Einar Pheasant, MD

## 2021-03-16 ENCOUNTER — Encounter: Payer: Self-pay | Admitting: Internal Medicine

## 2021-03-16 ENCOUNTER — Telehealth: Payer: Self-pay | Admitting: Internal Medicine

## 2021-03-16 DIAGNOSIS — D721 Eosinophilia, unspecified: Secondary | ICD-10-CM | POA: Insufficient documentation

## 2021-03-16 DIAGNOSIS — Z8616 Personal history of COVID-19: Secondary | ICD-10-CM | POA: Insufficient documentation

## 2021-03-16 NOTE — Telephone Encounter (Signed)
Left message for patient to call back and schedule Medicare Annual Wellness Visit (AWV) in office.   If not able to come in office, please offer to do virtually or by telephone.   Last AWV:02/24/2020  Please schedule at anytime with Nurse Health Advisor.

## 2021-03-16 NOTE — Assessment & Plan Note (Signed)
Overall doing well post covid.  Some occasional joint stiffness.  No cough or sob.  Follow.

## 2021-03-16 NOTE — Assessment & Plan Note (Signed)
Colonoscopy 08/24/20 - recommended f/u in 10 years.  

## 2021-03-16 NOTE — Assessment & Plan Note (Signed)
Recheck cbc next labs.  (elevated last check).  Recheck to confirm normal.

## 2021-03-16 NOTE — Assessment & Plan Note (Signed)
Blood pressure as outlined.  128/68 on recheck.  Doing well.  Continue avapro.[name] [name]

## 2021-03-16 NOTE — Assessment & Plan Note (Signed)
On thyroid replacement.  Follow tsh.  

## 2021-03-16 NOTE — Assessment & Plan Note (Signed)
Takes lunesta.  Follow. Stable.

## 2021-03-16 NOTE — Assessment & Plan Note (Signed)
On crestor.  Low cholesterol diet and exercise.  Follow lipid panel and liver function tests.   

## 2021-04-26 ENCOUNTER — Other Ambulatory Visit: Payer: Self-pay | Admitting: Internal Medicine

## 2021-04-27 DIAGNOSIS — Z859 Personal history of malignant neoplasm, unspecified: Secondary | ICD-10-CM | POA: Diagnosis not present

## 2021-04-27 DIAGNOSIS — Z85828 Personal history of other malignant neoplasm of skin: Secondary | ICD-10-CM | POA: Diagnosis not present

## 2021-04-27 DIAGNOSIS — L578 Other skin changes due to chronic exposure to nonionizing radiation: Secondary | ICD-10-CM | POA: Diagnosis not present

## 2021-04-27 DIAGNOSIS — Z872 Personal history of diseases of the skin and subcutaneous tissue: Secondary | ICD-10-CM | POA: Diagnosis not present

## 2021-04-27 DIAGNOSIS — Z85820 Personal history of malignant melanoma of skin: Secondary | ICD-10-CM | POA: Diagnosis not present

## 2021-06-22 ENCOUNTER — Other Ambulatory Visit: Payer: Self-pay | Admitting: Internal Medicine

## 2021-06-29 DIAGNOSIS — G5602 Carpal tunnel syndrome, left upper limb: Secondary | ICD-10-CM | POA: Diagnosis not present

## 2021-06-30 ENCOUNTER — Other Ambulatory Visit: Payer: Self-pay | Admitting: Internal Medicine

## 2021-07-12 ENCOUNTER — Other Ambulatory Visit (INDEPENDENT_AMBULATORY_CARE_PROVIDER_SITE_OTHER): Payer: PPO

## 2021-07-12 ENCOUNTER — Other Ambulatory Visit: Payer: Self-pay

## 2021-07-12 DIAGNOSIS — E785 Hyperlipidemia, unspecified: Secondary | ICD-10-CM | POA: Diagnosis not present

## 2021-07-12 DIAGNOSIS — I10 Essential (primary) hypertension: Secondary | ICD-10-CM

## 2021-07-12 DIAGNOSIS — D721 Eosinophilia, unspecified: Secondary | ICD-10-CM | POA: Diagnosis not present

## 2021-07-12 LAB — LIPID PANEL
Cholesterol: 228 mg/dL — ABNORMAL HIGH (ref 0–200)
HDL: 81.9 mg/dL (ref >39.00)
LDL Cholesterol: 129 mg/dL — ABNORMAL HIGH (ref 0–99)
NonHDL: 145.97
Total CHOL/HDL Ratio: 3
Triglycerides: 86 mg/dL (ref 0.0–149.0)
VLDL: 17.2 mg/dL (ref 0.0–40.0)

## 2021-07-12 LAB — BASIC METABOLIC PANEL
BUN: 18 mg/dL (ref 6–23)
CO2: 29 mEq/L (ref 19–32)
Calcium: 9 mg/dL (ref 8.4–10.5)
Chloride: 104 mEq/L (ref 96–112)
Creatinine, Ser: 0.7 mg/dL (ref 0.40–1.20)
GFR: 86.62 mL/min (ref >60.00)
Glucose, Bld: 97 mg/dL (ref 70–99)
Potassium: 3.8 mEq/L (ref 3.5–5.1)
Sodium: 140 mEq/L (ref 135–145)

## 2021-07-12 LAB — CBC WITH DIFFERENTIAL/PLATELET
Basophils Absolute: 0.1 10*3/uL (ref 0.0–0.1)
Basophils Relative: 2.1 % (ref 0.0–3.0)
Eosinophils Absolute: 0.4 10*3/uL (ref 0.0–0.7)
Eosinophils Relative: 7.2 % — ABNORMAL HIGH (ref 0.0–5.0)
HCT: 38.5 % (ref 36.0–46.0)
Hemoglobin: 13 g/dL (ref 12.0–15.0)
Lymphocytes Relative: 23.5 % (ref 12.0–46.0)
Lymphs Abs: 1.4 10*3/uL (ref 0.7–4.0)
MCHC: 33.8 g/dL (ref 30.0–36.0)
MCV: 88.2 fl (ref 78.0–100.0)
Monocytes Absolute: 0.4 10*3/uL (ref 0.1–1.0)
Monocytes Relative: 6.8 % (ref 3.0–12.0)
Neutro Abs: 3.7 10*3/uL (ref 1.4–7.7)
Neutrophils Relative %: 60.4 % (ref 43.0–77.0)
Platelets: 228 10*3/uL (ref 150.0–400.0)
RBC: 4.37 Mil/uL (ref 3.87–5.11)
RDW: 12.8 % (ref 11.5–15.5)
WBC: 6.1 10*3/uL (ref 4.0–10.5)

## 2021-07-12 LAB — HEPATIC FUNCTION PANEL
ALT: 23 U/L (ref 0–35)
AST: 18 U/L (ref 0–37)
Albumin: 4.4 g/dL (ref 3.5–5.2)
Alkaline Phosphatase: 72 U/L (ref 39–117)
Bilirubin, Direct: 0.1 mg/dL (ref 0.0–0.3)
Total Bilirubin: 0.5 mg/dL (ref 0.2–1.2)
Total Protein: 6.2 g/dL (ref 6.0–8.3)

## 2021-07-13 ENCOUNTER — Other Ambulatory Visit: Payer: Self-pay

## 2021-07-13 ENCOUNTER — Ambulatory Visit (INDEPENDENT_AMBULATORY_CARE_PROVIDER_SITE_OTHER): Payer: PPO | Admitting: Internal Medicine

## 2021-07-13 DIAGNOSIS — G479 Sleep disorder, unspecified: Secondary | ICD-10-CM

## 2021-07-13 DIAGNOSIS — F439 Reaction to severe stress, unspecified: Secondary | ICD-10-CM

## 2021-07-13 DIAGNOSIS — E785 Hyperlipidemia, unspecified: Secondary | ICD-10-CM | POA: Diagnosis not present

## 2021-07-13 DIAGNOSIS — D721 Eosinophilia, unspecified: Secondary | ICD-10-CM | POA: Diagnosis not present

## 2021-07-13 DIAGNOSIS — E039 Hypothyroidism, unspecified: Secondary | ICD-10-CM | POA: Diagnosis not present

## 2021-07-13 DIAGNOSIS — I10 Essential (primary) hypertension: Secondary | ICD-10-CM

## 2021-07-13 DIAGNOSIS — I700 Atherosclerosis of aorta: Secondary | ICD-10-CM | POA: Diagnosis not present

## 2021-07-13 DIAGNOSIS — Z86010 Personal history of colonic polyps: Secondary | ICD-10-CM

## 2021-07-13 MED ORDER — SERTRALINE HCL 50 MG PO TABS: ORAL_TABLET | ORAL | 3 refills | Status: DC

## 2021-07-13 MED ORDER — ROSUVASTATIN CALCIUM 10 MG PO TABS: 10.0000 mg | ORAL_TABLET | Freq: Every day | ORAL | 1 refills | Status: DC

## 2021-07-13 NOTE — Progress Notes (Signed)
Patient ID: [name] [name], female   DOB: [birth year], 70 y.o.   MRN: [id number]   Subjective:    Patient ID: [name] [name], female    DOB: Dec 12, 1950, 70 y.o.   MRN: [id number]  This visit occurred during the SARS-CoV-2 public health emergency.  Safety protocols were in place, including screening questions prior to the visit, additional usage of staff PPE, and extensive cleaning of exam room while observing appropriate contact time as indicated for disinfecting solutions.   Patient here for a scheduled follow up.   Chief Complaint  Patient presents with   Hyperlipidemia   Hypertension   Hypothyroidism   .   HPI Increased stress.  Discussed.  Increased stress related to family medical issues.  Trying to take care of her mother and father-n-law.  Now has a caretaker coming out for several hours.  Having to get her to appts, etc.  Needs some help.  Tries to stay active.  No chest pain or sob reported.  No abdominal pain or bowel change reported.  Seeing ortho for CTS.  Planning for NCS and f/u with Dr Peggye Ley.     Past Medical History:  Diagnosis Date   Adenomatous polyp 2005   Arrhythmia    H/O   Basal cell carcinoma    CTS (carpal tunnel syndrome)    Environmental allergies    History of chicken pox    HTN (hypertension)    Hypercholesterolemia    Hypothyroidism    Past Surgical History:  Procedure Laterality Date   BASAL CELL CARCINOMA EXCISION     right hip,abd, right arm, back neck   BREAST CYST ASPIRATION Left    negative   COLONOSCOPY WITH PROPOFOL N/A 08/24/2020   Procedure: COLONOSCOPY WITH PROPOFOL;  Surgeon: Lin Landsman, MD;  Location: [facility];  Service: Gastroenterology;  Laterality: N/A;   CTS releast Rt wrist     lipoma removal     Rt shoulder    MELANOMA EXCISION     right buttocks, top of scalp   POLYPECTOMY     + TA   TUBAL LIGATION  1982   Family History  Problem Relation Age of Onset   Cervical cancer Mother    Arthritis Mother     Hypertension Mother    Sudden death Mother        cerbral hemorrage   Heart disease Father    Cancer Father        lung cancer   Diabetes Maternal Grandmother    Breast cancer Paternal Aunt 36   Colon cancer Neg Hx    Social History   Socioeconomic History   Marital status: Widowed    Spouse name: Not on file   Number of children: 1   Years of education: Not on file   Highest education level: Not on file  Occupational History   Occupation: [employer]/ Ortho     Employer: [employer]  Tobacco Use   Smoking status: Former    Types: Cigarettes    Quit date: 07/16/1980    Years since quitting: 41.0   Smokeless tobacco: Never  Vaping Use   Vaping Use: Never used  Substance and Sexual Activity   Alcohol use: Yes    Alcohol/week: 7.0 standard drinks    Types: 7 Glasses of wine per week    Comment: 1 per day    Drug use: No   Sexual activity: Never  Other Topics Concern   Not on file  Social History  Narrative   Widowed; 1 son.    Retired Art therapist   Daily caffeine use 2-3/day       Cell # [phone]   Social Determinants of Radio broadcast assistant Strain: Not on file  Food Insecurity: Not on file  Transportation Needs: Not on file  Physical Activity: Not on file  Stress: Not on file  Social Connections: Not on file     Review of Systems  Constitutional:  Negative for appetite change and unexpected weight change.  HENT:  Negative for congestion and sinus pressure.   Respiratory:  Negative for cough, chest tightness and shortness of breath.   Cardiovascular:  Negative for chest pain, palpitations and leg swelling.  Gastrointestinal:  Negative for abdominal pain, diarrhea, nausea and vomiting.  Genitourinary:  Negative for difficulty urinating and dysuria.  Musculoskeletal:  Negative for myalgias.       Seeing ortho - CTS  Skin:  Negative for color change and rash.  Neurological:  Negative for dizziness, light-headedness and headaches.   Psychiatric/Behavioral:  Negative for agitation.        Increased stress as outlined.        Objective:     BP 134/72   Pulse 90   Temp 97.6 F (36.4 C)   Resp 16   Ht 5\' 2"  (1.575 m)   Wt 146 lb (66.2 kg)   SpO2 98%   BMI 26.70 kg/m  Wt Readings from Last 3 Encounters:  07/13/21 146 lb (66.2 kg)  03/11/21 146 lb 12.8 oz (66.6 kg)  02/02/21 142 lb 4.8 oz (64.5 kg)    Physical Exam Vitals reviewed.  Constitutional:      General: She is not in acute distress.    Appearance: Normal appearance.  HENT:     Head: Normocephalic and atraumatic.     Right Ear: External ear normal.     Left Ear: External ear normal.  Eyes:     General: No scleral icterus.       Right eye: No discharge.        Left eye: No discharge.     Conjunctiva/sclera: Conjunctivae normal.  Neck:     Thyroid: No thyromegaly.  Cardiovascular:     Rate and Rhythm: Normal rate and regular rhythm.  Pulmonary:     Effort: No respiratory distress.     Breath sounds: Normal breath sounds. No wheezing.  Abdominal:     General: Bowel sounds are normal.     Palpations: Abdomen is soft.     Tenderness: There is no abdominal tenderness.  Musculoskeletal:        General: No swelling or tenderness.     Cervical back: Neck supple. No tenderness.  Lymphadenopathy:     Cervical: No cervical adenopathy.  Skin:    Findings: No erythema or rash.  Neurological:     Mental Status: She is alert.  Psychiatric:        Mood and Affect: Mood normal.        Behavior: Behavior normal.     Outpatient Encounter Medications as of 07/13/2021  Medication Sig   rosuvastatin (CRESTOR) 10 MG tablet Take 1 tablet (10 mg total) by mouth daily.   sertraline (ZOLOFT) 50 MG tablet Take 1/2 tablet q day for one week and then one per day.   cetirizine (ZYRTEC) 10 MG tablet Take 10 mg by mouth daily.   cholecalciferol (VITAMIN D) 1000 units tablet Take 1,000 Units by mouth daily.   Eszopiclone 3  MG TABS TAKE ONE TABLET BY MOUTH  IMMEDIATELY BEFORE BEDTIME   irbesartan (AVAPRO) 75 MG tablet TAKE ONE TABLET BY MOUTH EVERY DAY   metoCLOPramide (REGLAN) 5 MG tablet Take 1 tablet (5 mg total) by mouth 3 (three) times daily before meals.   Multiple Vitamins-Minerals (HAIR SKIN AND NAILS FORMULA PO) Take 1 tablet by mouth daily.   Nutritional Supplements (NUTRITIONAL SUPPLEMENT PO) Take by mouth. DoTERRA: ALPHA CRS+, xEO MEGA, MICROPLEX MVp, ON GUARD (protective blend)   ondansetron (ZOFRAN ODT) 4 MG disintegrating tablet Take 1 tablet (4 mg total) by mouth every 8 (eight) hours as needed for nausea or vomiting.   pantoprazole (PROTONIX) 40 MG tablet TAKE 1 TABLET BY MOUTH ONCE DAILY   SYNTHROID 175 MCG tablet TAKE 1 TABLET EVERY MORNING WITH A GLASSOF WATER 30 TO 60 MINUTES BEFORE BREAKFAST   [DISCONTINUED] rosuvastatin (CRESTOR) 5 MG tablet TAKE ONE TABLET EVERY DAY   No facility-administered encounter medications on file as of 07/13/2021.     Lab Results  Component Value Date   WBC 6.1 07/12/2021   HGB 13.0 07/12/2021   HCT 38.5 07/12/2021   PLT 228.0 07/12/2021   GLUCOSE 97 07/12/2021   CHOL 228 (H) 07/12/2021   TRIG 86.0 07/12/2021   HDL 81.90 07/12/2021   LDLDIRECT 118.2 10/16/2006   LDLCALC 129 (H) 07/12/2021   ALT 23 07/12/2021   AST 18 07/12/2021   NA 140 07/12/2021   K 3.8 07/12/2021   CL 104 07/12/2021   CREATININE 0.70 07/12/2021   BUN 18 07/12/2021   CO2 29 07/12/2021   TSH 0.77 03/09/2021    MM 3D SCREEN BREAST BILATERAL  Result Date: 09/04/2020 CLINICAL DATA:  Screening. EXAM: DIGITAL SCREENING BILATERAL MAMMOGRAM WITH TOMO AND CAD COMPARISON:  Previous exam(s). ACR Breast Density Category c: The breast tissue is heterogeneously dense, which may obscure small masses. FINDINGS: There are no findings suspicious for malignancy. The images were evaluated with computer-aided detection. IMPRESSION: No mammographic evidence of malignancy. A result letter of this screening mammogram will be mailed  directly to the patient. RECOMMENDATION: Screening mammogram in one year. (Code:SM-B-01Y) BI-RADS CATEGORY  1: Negative. Electronically Signed   By: Lillia Mountain M.D.   On: 09/04/2020 10:14       Assessment & Plan:   Problem List Items Addressed This Visit     Aortic atherosclerosis ([place])    Continue crestor.        Relevant Medications   rosuvastatin (CRESTOR) 10 MG tablet   Eosinophilia    Last cbc revealed eosinophil count improved.        Essential hypertension    Blood pressure as outlined.  Has been doing well.  Continue avapro..        Relevant Medications   rosuvastatin (CRESTOR) 10 MG tablet   History of colonic polyps    Colonoscopy 08/24/20 - recommended f/u in 10 years.       Hyperlipidemia    The 10-year ASCVD risk score (Arnett DK, et al., 2019) is: 16.6%   Values used to calculate the score:     Age: 68 years     Sex: Female     Is Non-Hispanic African American: No     Diabetic: No     Tobacco smoker: No     Systolic Blood Pressure: 782 mmHg     Is BP treated: Yes     HDL Cholesterol: 81.9 mg/dL     Total Cholesterol: 228 mg/dL  On crestor.  Low  cholesterol diet and exercise.  Follow lipid panel and liver function tests.        Relevant Medications   rosuvastatin (CRESTOR) 10 MG tablet   Other Relevant Orders   Basic metabolic panel   Hepatic function panel   Lipid panel   Hypothyroidism    On thyroid replacement.  Follow tsh.       Sleeping difficulty    Taking 1/2 lunesta.  Follow.        Stress    Increased stress as outlined.  Discussed. Feels needs something to help level things out.  Discussed treatment options.  Start zoloft as directed.  Follow.  Get her back in soon to reassess.  Call with update.          Einar Pheasant, MD

## 2021-07-13 NOTE — Assessment & Plan Note (Addendum)
The 10-year ASCVD risk score (Arnett DK, et al., 2019) is: 16.6%   Values used to calculate the score:     Age: 70 years     Sex: Female     Is Non-Hispanic African American: No     Diabetic: No     Tobacco smoker: No     Systolic Blood Pressure: 924 mmHg     Is BP treated: Yes     HDL Cholesterol: 81.9 mg/dL     Total Cholesterol: 228 mg/dL  On crestor.  Low cholesterol diet and exercise.  Follow lipid panel and liver function tests.

## 2021-07-13 NOTE — Patient Instructions (Signed)
Increase crestor to 10mg per day 

## 2021-07-18 ENCOUNTER — Encounter: Payer: Self-pay | Admitting: Internal Medicine

## 2021-07-18 DIAGNOSIS — F439 Reaction to severe stress, unspecified: Secondary | ICD-10-CM | POA: Insufficient documentation

## 2021-07-18 DIAGNOSIS — I700 Atherosclerosis of aorta: Secondary | ICD-10-CM | POA: Insufficient documentation

## 2021-07-18 NOTE — Assessment & Plan Note (Signed)
Blood pressure as outlined.  Has been doing well.  Continue avapro.Marland Kitchen

## 2021-07-18 NOTE — Assessment & Plan Note (Signed)
Colonoscopy 08/24/20 - recommended f/u in 10 years.  

## 2021-07-18 NOTE — Assessment & Plan Note (Signed)
Increased stress as outlined.  Discussed. Feels needs something to help level things out.  Discussed treatment options.  Start zoloft as directed.  Follow.  Get her back in soon to reassess.  Call with update.

## 2021-07-18 NOTE — Assessment & Plan Note (Signed)
Last cbc revealed eosinophil count improved.

## 2021-07-18 NOTE — Assessment & Plan Note (Signed)
On thyroid replacement.  Follow tsh.  

## 2021-07-18 NOTE — Assessment & Plan Note (Signed)
Taking 1/2 lunesta.  Follow.

## 2021-07-18 NOTE — Assessment & Plan Note (Signed)
Continue crestor 

## 2021-07-22 ENCOUNTER — Encounter: Payer: Self-pay | Admitting: Internal Medicine

## 2021-07-22 DIAGNOSIS — G5602 Carpal tunnel syndrome, left upper limb: Secondary | ICD-10-CM | POA: Diagnosis not present

## 2021-07-23 NOTE — Telephone Encounter (Signed)
Patient said she hit the effected leg twice atone in one week , wondering if it could be coming from hitting her leg says stillsome swelling  but not as much as yesterday no pain. Does not know if injury or increase  in crestor or taking zoloft might have caused . Advised patient since going down today if swelling comes back [facility] has walkin on Saturday she could get X-ray there to make sure she has not injured leg and to update [place] on Monday.

## 2021-07-23 NOTE — Telephone Encounter (Signed)
Swelling unilateral images attached left leg only

## 2021-07-23 NOTE — Telephone Encounter (Signed)
If unilateral swelling - then agree with need for evaluation to confirm nothing more acute going on.

## 2021-07-27 DIAGNOSIS — G5602 Carpal tunnel syndrome, left upper limb: Secondary | ICD-10-CM | POA: Diagnosis not present

## 2021-07-29 ENCOUNTER — Other Ambulatory Visit: Payer: Self-pay | Admitting: Internal Medicine

## 2021-07-29 NOTE — Telephone Encounter (Signed)
RX Refill: lunesta Last Seen: 07-13-21 Last Ordered: 03-11-21 Next Appt: 09-29-21

## 2021-07-29 NOTE — Telephone Encounter (Signed)
Rx ok'd for lunesta #30 with one refill.  

## 2021-08-03 ENCOUNTER — Other Ambulatory Visit: Payer: Self-pay | Admitting: Internal Medicine

## 2021-08-03 DIAGNOSIS — Z1231 Encounter for screening mammogram for malignant neoplasm of breast: Secondary | ICD-10-CM

## 2021-09-13 ENCOUNTER — Encounter: Payer: Self-pay | Admitting: Internal Medicine

## 2021-09-13 NOTE — Telephone Encounter (Signed)
Ok to work in - virtual.  PCR swab tomorrow.

## 2021-09-13 NOTE — Telephone Encounter (Signed)
Pt scheduled. Coming in for swab at 1:15 prior to appt

## 2021-09-13 NOTE — Telephone Encounter (Signed)
Can we add her on at 4 tomorrow for a virtual to discuss? Confirmed doing ok. No sob, chest tightness, phlegm is clear, etc.

## 2021-09-14 ENCOUNTER — Telehealth (INDEPENDENT_AMBULATORY_CARE_PROVIDER_SITE_OTHER): Payer: PPO | Admitting: Internal Medicine

## 2021-09-14 DIAGNOSIS — J329 Chronic sinusitis, unspecified: Secondary | ICD-10-CM

## 2021-09-14 DIAGNOSIS — I10 Essential (primary) hypertension: Secondary | ICD-10-CM | POA: Diagnosis not present

## 2021-09-14 DIAGNOSIS — R0981 Nasal congestion: Secondary | ICD-10-CM | POA: Diagnosis not present

## 2021-09-14 MED ORDER — DOXYCYCLINE HYCLATE 100 MG PO TABS: 100.0000 mg | ORAL_TABLET | Freq: Two times a day (BID) | ORAL | 0 refills | Status: DC

## 2021-09-14 NOTE — Progress Notes (Signed)
Patient ID: [name] [name], female   DOB: [birth year], 71 y.o.   MRN: [id number]   Virtual Visit via video Note  This visit type was conducted due to national recommendations for restrictions regarding the COVID-19 pandemic (e.g. social distancing).  This format is felt to be most appropriate for this patient at this time.  All issues noted in this document were discussed and addressed.  No physical exam was performed (except for noted visual exam findings with Video Visits).   I connected with [name] by a video enabled telemedicine application and verified that I am speaking with the correct person using two identifiers. Location patient: home Location provider: work Persons participating in the virtual visit: patient, provider  The limitations, risks, security and privacy concerns of performing an evaluation and management service by video and the availability of in person appointments have been discussed.  It has also been discussed with the patient that there may be a patient responsible charge related to this service. The patient expressed understanding and agreed to proceed.   Reason for visit: work in appt  HPI: Work in with concerns regarding increased sinus pressure.  Reports nasal congestion - productive of colored mucus.  Increased cough - from drainage.  Some better last week.  Persistent congestion as outlined.  White ulcers - soft palate.  No chest pain or sob.  Intermittent body aches.  No nausea or vomiting.  No diarrhea.  No acid reflux.  Taking mucinex intermittently.  Has been using nasacort.  Tylenol prn.  Covid test negative x 3.  No fever.     ROS: See pertinent positives and negatives per HPI.  Past Medical History:  Diagnosis Date   Adenomatous polyp 2005   Arrhythmia    H/O   Basal cell carcinoma    CTS (carpal tunnel syndrome)    Environmental allergies    History of chicken pox    HTN (hypertension)    Hypercholesterolemia    Hypothyroidism     Past  Surgical History:  Procedure Laterality Date   BASAL CELL CARCINOMA EXCISION     right hip,abd, right arm, back neck   BREAST CYST ASPIRATION Left    negative   COLONOSCOPY WITH PROPOFOL N/A 08/24/2020   Procedure: COLONOSCOPY WITH PROPOFOL;  Surgeon: Lin Landsman, MD;  Location: [place];  Service: Gastroenterology;  Laterality: N/A;   CTS releast Rt wrist     lipoma removal     Rt shoulder    MELANOMA EXCISION     right buttocks, top of scalp   POLYPECTOMY     + TA   TUBAL LIGATION  1982    Family History  Problem Relation Age of Onset   Cervical cancer Mother    Arthritis Mother    Hypertension Mother    Sudden death Mother        cerbral hemorrage   Heart disease Father    Cancer Father        lung cancer   Diabetes Maternal Grandmother    Breast cancer Paternal Aunt 74   Colon cancer Neg Hx     SOCIAL HX: reviewed.    Current Outpatient Medications:    doxycycline (VIBRA-TABS) 100 MG tablet, Take 1 tablet (100 mg total) by mouth 2 (two) times daily., Disp: 20 tablet, Rfl: 0   cetirizine (ZYRTEC) 10 MG tablet, Take 10 mg by mouth daily., Disp: , Rfl:    cholecalciferol (VITAMIN D) 1000 units tablet, Take 1,000 Units by mouth  daily., Disp: , Rfl:    Eszopiclone 3 MG TABS, TAKE 1 TABLET BY MOUTH IMMEDIATELY BEFORE BEDTIME, Disp: 30 tablet, Rfl: 1   irbesartan (AVAPRO) 75 MG tablet, TAKE ONE TABLET BY MOUTH EVERY DAY, Disp: 90 tablet, Rfl: 1   metoCLOPramide (REGLAN) 5 MG tablet, Take 1 tablet (5 mg total) by mouth 3 (three) times daily before meals., Disp: 30 tablet, Rfl: 0   Multiple Vitamins-Minerals (HAIR SKIN AND NAILS FORMULA PO), Take 1 tablet by mouth daily., Disp: , Rfl:    Nutritional Supplements (NUTRITIONAL SUPPLEMENT PO), Take by mouth. DoTERRA: ALPHA CRS+, xEO MEGA, MICROPLEX MVp, ON GUARD (protective blend), Disp: , Rfl:    ondansetron (ZOFRAN ODT) 4 MG disintegrating tablet, Take 1 tablet (4 mg total) by mouth every 8 (eight) hours as needed  for nausea or vomiting., Disp: 20 tablet, Rfl: 0   pantoprazole (PROTONIX) 40 MG tablet, TAKE 1 TABLET BY MOUTH ONCE DAILY, Disp: 30 tablet, Rfl: 2   rosuvastatin (CRESTOR) 10 MG tablet, Take 1 tablet (10 mg total) by mouth daily., Disp: 90 tablet, Rfl: 1   sertraline (ZOLOFT) 50 MG tablet, Take 1/2 tablet q day for one week and then one per day., Disp: 30 tablet, Rfl: 3   SYNTHROID 175 MCG tablet, TAKE 1 TABLET EVERY MORNING WITH A GLASSOF WATER 30 TO 60 MINUTES BEFORE BREAKFAST, Disp: 30 tablet, Rfl: 5  EXAM:  GENERAL: alert, oriented, appears well and in no acute distress  HEENT: atraumatic, conjunttiva clear, no obvious abnormalities on inspection of external nose and ears  NECK: normal movements of the head and neck  LUNGS: on inspection no signs of respiratory distress, breathing rate appears normal, no obvious gross SOB, gasping or wheezing  CV: no obvious cyanosis  PSYCH/NEURO: pleasant and cooperative, no obvious depression or anxiety, speech and thought processing grossly intact  ASSESSMENT AND PLAN:  Discussed the following assessment and plan:  Problem List Items Addressed This Visit     Essential hypertension    Blood pressure has been doing well.  Continue avapro.       Sinusitis    Symptoms persistent and appear to be c/w a sinus infection. Nasal swab obtained for covid/flu and RSV.   Treat with doxycycline.  Probiotic as directed.  Saline nasal spray/steroid nasal spray.  Mucinex.  Rest.  Fluids.  Follow.  Call with update.  Will notify ortho of current infection.       Relevant Medications   doxycycline (VIBRA-TABS) 100 MG tablet   Other Visit Diagnoses     Nasal congestion    -  Primary   Relevant Orders   COVID-19, Flu A+B and RSV (Completed)       Return if symptoms worsen or fail to improve, for keep scheduled.   I discussed the assessment and treatment plan with the patient. The patient was provided an opportunity to ask questions and all were  answered. The patient agreed with the plan and demonstrated an understanding of the instructions.   The patient was advised to call back or seek an in-person evaluation if the symptoms worsen or if the condition fails to improve as anticipated.   Einar Pheasant, MD

## 2021-09-15 ENCOUNTER — Encounter: Payer: Self-pay | Admitting: Internal Medicine

## 2021-09-15 LAB — COVID-19, FLU A+B AND RSV
Influenza A, NAA: NOT DETECTED
Influenza B, NAA: NOT DETECTED
RSV, NAA: NOT DETECTED
SARS-CoV-2, NAA: NOT DETECTED

## 2021-09-20 ENCOUNTER — Encounter: Payer: Self-pay | Admitting: Internal Medicine

## 2021-09-20 NOTE — Assessment & Plan Note (Signed)
Blood pressure has been doing well.  Continue avapro.  

## 2021-09-20 NOTE — Assessment & Plan Note (Addendum)
Symptoms persistent and appear to be c/w a sinus infection. Nasal swab obtained for covid/flu and RSV.   Treat with doxycycline.  Probiotic as directed.  Saline nasal spray/steroid nasal spray.  Mucinex.  Rest.  Fluids.  Follow.  Call with update.  Will notify ortho of current infection.

## 2021-09-27 ENCOUNTER — Other Ambulatory Visit: Payer: Self-pay | Admitting: Internal Medicine

## 2021-09-29 ENCOUNTER — Ambulatory Visit (INDEPENDENT_AMBULATORY_CARE_PROVIDER_SITE_OTHER): Payer: PPO | Admitting: Internal Medicine

## 2021-09-29 ENCOUNTER — Encounter: Payer: Self-pay | Admitting: Internal Medicine

## 2021-09-29 ENCOUNTER — Other Ambulatory Visit: Payer: Self-pay

## 2021-09-29 VITALS — BP 130/70 | HR 70 | Temp 97.9°F | Resp 16 | Ht 62.0 in | Wt 151.4 lb

## 2021-09-29 DIAGNOSIS — E039 Hypothyroidism, unspecified: Secondary | ICD-10-CM

## 2021-09-29 DIAGNOSIS — Z86010 Personal history of colonic polyps: Secondary | ICD-10-CM

## 2021-09-29 DIAGNOSIS — I10 Essential (primary) hypertension: Secondary | ICD-10-CM | POA: Diagnosis not present

## 2021-09-29 DIAGNOSIS — G479 Sleep disorder, unspecified: Secondary | ICD-10-CM | POA: Diagnosis not present

## 2021-09-29 DIAGNOSIS — I700 Atherosclerosis of aorta: Secondary | ICD-10-CM

## 2021-09-29 DIAGNOSIS — J329 Chronic sinusitis, unspecified: Secondary | ICD-10-CM | POA: Diagnosis not present

## 2021-09-29 DIAGNOSIS — E785 Hyperlipidemia, unspecified: Secondary | ICD-10-CM

## 2021-09-29 DIAGNOSIS — F439 Reaction to severe stress, unspecified: Secondary | ICD-10-CM

## 2021-09-29 MED ORDER — LEVOCETIRIZINE DIHYDROCHLORIDE 5 MG PO TABS: 5.0000 mg | ORAL_TABLET | Freq: Every day | ORAL | 1 refills | Status: DC

## 2021-09-29 NOTE — Progress Notes (Signed)
Patient ID: [name] [name], female   DOB: 04-26-1951, 71 y.o.   MRN: [id number]   Subjective:    Patient ID: [name] [name], female    DOB: 1950/10/20, 71 y.o.   MRN: [id number]  This visit occurred during the SARS-CoV-2 public health emergency.  Safety protocols were in place, including screening questions prior to the visit, additional usage of staff PPE, and extensive cleaning of exam room while observing appropriate contact time as indicated for disinfecting solutions.   Patient here for a scheduled follow up.   Chief Complaint  Patient presents with   Depression   Hyperlipidemia   Gastroesophageal Reflux   Clear Drainage   .   HPI Recently evaluated and diagnosed with probable sinus infection.  Treated.  Better.  Still with increased drainage and dripping nose.  No significant cough.  No chest pain or sob.  No acid reflux reported.  No abdominal pain.  Bowels moving.  With increased stress with family medical issues.   Discussed.  Does have caretaker in place.  Palliative care to come out and evaluate.     Past Medical History:  Diagnosis Date   Adenomatous polyp 2005   Arrhythmia    H/O   Basal cell carcinoma    CTS (carpal tunnel syndrome)    Environmental allergies    History of chicken pox    HTN (hypertension)    Hypercholesterolemia    Hypothyroidism    Past Surgical History:  Procedure Laterality Date   BASAL CELL CARCINOMA EXCISION     right hip,abd, right arm, back neck   BREAST CYST ASPIRATION Left    negative   COLONOSCOPY WITH PROPOFOL N/A 08/24/2020   Procedure: COLONOSCOPY WITH PROPOFOL;  Surgeon: Lin Landsman, MD;  Location: [place];  Service: Gastroenterology;  Laterality: N/A;   CTS releast Rt wrist     lipoma removal     Rt shoulder    MELANOMA EXCISION     right buttocks, top of scalp   POLYPECTOMY     + TA   TUBAL LIGATION  1982   Family History  Problem Relation Age of Onset   Cervical cancer Mother    Arthritis Mother     Hypertension Mother    Sudden death Mother        cerbral hemorrage   Heart disease Father    Cancer Father        lung cancer   Diabetes Maternal Grandmother    Breast cancer Paternal Aunt 16   Colon cancer Neg Hx    Social History   Socioeconomic History   Marital status: Widowed    Spouse name: Not on file   Number of children: 1   Years of education: Not on file   Highest education level: Not on file  Occupational History   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  Retired Art therapist   Daily caffeine use 2-3/day       Cell # [id number]   Social Determinants of Radio broadcast assistant Strain: Not on file  Food Insecurity: Not on file  Transportation Needs: Not on file  Physical Activity: Not on file  Stress: Not on file  Social Connections: Not on file     Review of Systems  Constitutional:  Negative for appetite change and unexpected weight change.  HENT:  Negative for sinus pressure.        Clear drainage as outlined.   Respiratory:  Negative for cough, chest tightness and shortness of breath.   Cardiovascular:  Negative for chest pain, palpitations and leg swelling.  Gastrointestinal:  Negative for abdominal pain, diarrhea, nausea and vomiting.  Genitourinary:  Negative for difficulty urinating and dysuria.  Musculoskeletal:  Negative for joint swelling and myalgias.  Skin:  Negative for color change and rash.  Neurological:  Negative for dizziness, light-headedness and  headaches.  Psychiatric/Behavioral:  Negative for agitation and dysphoric mood.       Objective:     BP 130/70    Pulse 70    Temp 97.9 F (36.6 C)    Resp 16    Ht 5\' 2"  (1.575 m)    Wt 151 lb 6.4 oz (68.7 kg)    SpO2 98%    BMI 27.69 kg/m  Wt Readings from Last 3 Encounters:  09/29/21 151 lb 6.4 oz (68.7 kg)  07/13/21 146 lb (66.2 kg)  03/11/21 146 lb 12.8 oz (66.6 kg)    Physical Exam Vitals reviewed.  Constitutional:      General: She is not in acute distress.    Appearance: Normal appearance.  HENT:     Head: Normocephalic and atraumatic.     Right Ear: External ear normal.     Left Ear: External ear normal.  Eyes:     General: No scleral icterus.       Right eye: No discharge.        Left eye: No discharge.     Conjunctiva/sclera: Conjunctivae normal.  Neck:     Thyroid: No thyromegaly.  Cardiovascular:     Rate and Rhythm: Normal rate and regular rhythm.  Pulmonary:     Effort: No respiratory distress.     Breath sounds: Normal breath sounds. No wheezing.  Abdominal:     General: Bowel sounds are normal.     Palpations: Abdomen is soft.     Tenderness: There is no abdominal tenderness.  Musculoskeletal:        General: No swelling or tenderness.     Cervical back: Neck supple. No tenderness.  Lymphadenopathy:     Cervical: No cervical adenopathy.  Skin:    Findings: No erythema or rash.  Neurological:     Mental Status: She is alert.  Psychiatric:        Mood and Affect: Mood normal.        Behavior: Behavior normal.     Outpatient Encounter Medications as of 09/29/2021  Medication Sig   levocetirizine (XYZAL) 5 MG tablet Take 1 tablet (5 mg total) by mouth daily.   cholecalciferol (VITAMIN D) 1000 units tablet Take 1,000 Units by mouth daily.   Eszopiclone 3 MG TABS TAKE 1 TABLET BY MOUTH IMMEDIATELY BEFORE BEDTIME   irbesartan (AVAPRO) 75 MG tablet TAKE ONE TABLET BY MOUTH EVERY DAY   metoCLOPramide (REGLAN) 5 MG tablet Take 1 tablet (5 mg total)  by mouth  3 (three) times daily before meals.   Multiple Vitamins-Minerals (HAIR SKIN AND NAILS FORMULA PO) Take 1 tablet by mouth daily.   Nutritional Supplements (NUTRITIONAL SUPPLEMENT PO) Take by mouth. DoTERRA: ALPHA CRS+, xEO MEGA, MICROPLEX MVp, ON GUARD (protective blend)   ondansetron (ZOFRAN ODT) 4 MG disintegrating tablet Take 1 tablet (4 mg total) by mouth every 8 (eight) hours as needed for nausea or vomiting.   pantoprazole (PROTONIX) 40 MG tablet TAKE 1 TABLET BY MOUTH ONCE DAILY   rosuvastatin (CRESTOR) 10 MG tablet Take 1 tablet (10 mg total) by mouth daily.   sertraline (ZOLOFT) 50 MG tablet Take 1/2 tablet q day for one week and then one per day.   SYNTHROID 175 MCG tablet TAKE 1 TABLET EVERY MORNING WITH A GLASSOF WATER 30 TO 60 MINUTES BEFORE BREAKFAST   [DISCONTINUED] cetirizine (ZYRTEC) 10 MG tablet Take 10 mg by mouth daily.   [DISCONTINUED] doxycycline (VIBRA-TABS) 100 MG tablet Take 1 tablet (100 mg total) by mouth 2 (two) times daily.   No facility-administered encounter medications on file as of 09/29/2021.     Lab Results  Component Value Date   WBC 6.1 07/12/2021   HGB 13.0 07/12/2021   HCT 38.5 07/12/2021   PLT 228.0 07/12/2021   GLUCOSE 97 07/12/2021   CHOL 228 (H) 07/12/2021   TRIG 86.0 07/12/2021   HDL 81.90 07/12/2021   LDLDIRECT 118.2 10/16/2006   LDLCALC 129 (H) 07/12/2021   ALT 23 07/12/2021   AST 18 07/12/2021   NA 140 07/12/2021   K 3.8 07/12/2021   CL 104 07/12/2021   CREATININE 0.70 07/12/2021   BUN 18 07/12/2021   CO2 29 07/12/2021   TSH 0.77 03/09/2021    MM 3D SCREEN BREAST BILATERAL  Result Date: 09/04/2020 CLINICAL DATA:  Screening. EXAM: DIGITAL SCREENING BILATERAL MAMMOGRAM WITH TOMO AND CAD COMPARISON:  Previous exam(s). ACR Breast Density Category c: The breast tissue is heterogeneously dense, which may obscure small masses. FINDINGS: There are no findings suspicious for malignancy. The images were evaluated with computer-aided  detection. IMPRESSION: No mammographic evidence of malignancy. A result letter of this screening mammogram will be mailed directly to the patient. RECOMMENDATION: Screening mammogram in one year. (Code:SM-B-01Y) BI-RADS CATEGORY  1: Negative. Electronically Signed   By: Lillia Mountain M.D.   On: 09/04/2020 10:14       Assessment & Plan:   Problem List Items Addressed This Visit     Aortic atherosclerosis ([place])    Continue crestor.        Essential hypertension    Blood pressure has been doing well.  Continue avapro.       History of colonic polyps    Colonoscopy 08/24/20 - recommended f/u in 10 years.       Hyperlipidemia - Primary     On crestor.  Low cholesterol diet and exercise.  Follow lipid panel and liver function tests.        Hypothyroidism    On thyroid replacement.  Follow tsh.       Sinusitis    Recently treated.  Symptoms improved.  Residual nasal dripping and drainage.  Clear.  Change antihistamine - trial of xyzal.  Steroid nasal spray.  Call with update.        Relevant Medications   levocetirizine (XYZAL) 5 MG tablet   Sleeping difficulty    Taking 1/2 lunesta.  Follow.        Stress    Increased stress as outlined.  Discussed.  On zoloft.  Continue.  Follow.  Appears to be handling things relatively well.  Palliative care coming to assess her mother-n-law this week.  Keep me posted.          Einar Pheasant, MD

## 2021-10-03 NOTE — Assessment & Plan Note (Signed)
Continue crestor 

## 2021-10-03 NOTE — Assessment & Plan Note (Addendum)
On crestor.  Low cholesterol diet and exercise.  Follow lipid panel and liver function tests.   

## 2021-10-03 NOTE — Assessment & Plan Note (Signed)
Taking 1/2 lunesta.  Follow.

## 2021-10-03 NOTE — Assessment & Plan Note (Signed)
Increased stress as outlined.  Discussed. On zoloft.  Continue.  Follow.  Appears to be handling things relatively well.  Palliative care coming to assess her mother-n-law this week.  Keep me posted.

## 2021-10-03 NOTE — Assessment & Plan Note (Signed)
Blood pressure has been doing well.  Continue avapro.  

## 2021-10-03 NOTE — Assessment & Plan Note (Signed)
Recently treated.  Symptoms improved.  Residual nasal dripping and drainage.  Clear.  Change antihistamine - trial of xyzal.  Steroid nasal spray.  Call with update.

## 2021-10-03 NOTE — Assessment & Plan Note (Signed)
Colonoscopy 08/24/20 - recommended f/u in 10 years.  

## 2021-10-03 NOTE — Assessment & Plan Note (Signed)
On thyroid replacement.  Follow tsh.  

## 2021-10-07 ENCOUNTER — Other Ambulatory Visit: Payer: Self-pay | Admitting: Internal Medicine

## 2021-10-07 ENCOUNTER — Encounter: Payer: Self-pay | Admitting: Internal Medicine

## 2021-10-08 ENCOUNTER — Other Ambulatory Visit: Payer: Self-pay

## 2021-10-08 MED ORDER — AZELASTINE HCL 0.1 % NA SOLN: 1.0000 | Freq: Every day | NASAL | 0 refills | Status: DC | PRN

## 2021-10-18 ENCOUNTER — Encounter: Payer: Self-pay | Admitting: Internal Medicine

## 2021-10-18 NOTE — Telephone Encounter (Signed)
Confirmed no sob, fever, etc. No one else around her is sick. Scheduled for virtual tomorrow. ?

## 2021-10-19 ENCOUNTER — Telehealth (INDEPENDENT_AMBULATORY_CARE_PROVIDER_SITE_OTHER): Payer: PPO | Admitting: Internal Medicine

## 2021-10-19 DIAGNOSIS — R051 Acute cough: Secondary | ICD-10-CM

## 2021-10-19 DIAGNOSIS — I10 Essential (primary) hypertension: Secondary | ICD-10-CM | POA: Diagnosis not present

## 2021-10-19 DIAGNOSIS — J329 Chronic sinusitis, unspecified: Secondary | ICD-10-CM

## 2021-10-19 MED ORDER — PREDNISONE 10 MG PO TABS: ORAL_TABLET | ORAL | 0 refills | Status: DC

## 2021-10-19 MED ORDER — AZITHROMYCIN 250 MG PO TABS: ORAL_TABLET | ORAL | 0 refills | Status: AC

## 2021-10-19 NOTE — Progress Notes (Signed)
Patient ID: [name] [name], female   DOB: 12-12-1950, 71 y.o.   MRN: [id number] ? ? ?Virtual Visit via video Note ? ?This visit type was conducted due to national recommendations for restrictions regarding the COVID-19 pandemic (e.g. social distancing).  This format is felt to be most appropriate for this patient at this time.  All issues noted in this document were discussed and addressed.  No physical exam was performed (except for noted visual exam findings with Video Visits).  ? ?I connected with [name] [name] by a video enabled telemedicine application and verified that I am speaking with the correct person using two identifiers. ?Location patient: home ?Location provider: work  ?Persons participating in the virtual visit: patient, provider ? ?The limitations, risks, security and privacy concerns of performing an evaluation and management service by video and the availability of in person appointments have been discussed.  It has also been discussed with the patient that there may be a patient responsible charge related to this service. The patient expressed understanding and agreed to proceed. ? ? ?Reason for visit: work in appt ? ?HPI: ?Work in with concerns regarding sinus infection.  Was evaluated 09/14/21 - concern regarding sinus infection.  Treated with abx.  Had f/u appt 09/29/21.   Was better. Placed on xyzal, flonase and astelin nasal spray.  Reports noticing increased sinus pressure and ears feel stopped up.  Nasal congestion.  No fever.  Does report cough - productive - loose. No chest pain or sob.  Minimal nausea Monday.  Some aching.  No vomiting.  No diarrhea.  Has taken two covid test - negative.  Taking robitussin DM.  Some coughing fits.  Scheduled for surgery end of week.   ? ? ?ROS: See pertinent positives and negatives per HPI. ? ?Past Medical History:  ?Diagnosis Date  ? Adenomatous polyp 2005  ? Arrhythmia   ? H/O  ? Basal cell carcinoma   ? CTS (carpal tunnel syndrome)   ? Environmental  allergies   ? History of chicken pox   ? HTN (hypertension)   ? Hypercholesterolemia   ? Hypothyroidism   ? ? ?Past Surgical History:  ?Procedure Laterality Date  ? BASAL CELL CARCINOMA EXCISION    ? right hip,abd, right arm, back neck  ? BREAST CYST ASPIRATION Left   ? negative  ? COLONOSCOPY WITH PROPOFOL N/A 08/24/2020  ? Procedure: COLONOSCOPY WITH PROPOFOL;  Surgeon: Lin Landsman, MD;  Location: [facility];  Service: Gastroenterology;  Laterality: N/A;  ? CTS releast Rt wrist    ? lipoma removal    ? Rt shoulder   ? MELANOMA EXCISION    ? right buttocks, top of scalp  ? POLYPECTOMY    ? + TA  ? TUBAL LIGATION  1982  ? ? ?Family History  ?Problem Relation Age of Onset  ? Cervical cancer Mother   ? Arthritis Mother   ? Hypertension Mother   ? Sudden death Mother   ?     cerbral hemorrage  ? Heart disease Father   ? Cancer Father   ?     lung cancer  ? Diabetes Maternal Grandmother   ? Breast cancer Paternal Aunt 69  ? Colon cancer Neg Hx   ? ? ?SOCIAL HX: reviewed.  ? ? ?Current Outpatient Medications:  ?  azithromycin (ZITHROMAX) 250 MG tablet, Take 2 tablets on day 1, then 1 tablet daily on days 2 through 5, Disp: 6 tablet, Rfl: 0 ?  predniSONE (DELTASONE) 10  MG tablet, Take 4 tablets x 1 day and then decrease by 1/2 tablet per day until down to zero mg., Disp: 18 tablet, Rfl: 0 ?  azelastine (ASTELIN) 0.1 % nasal spray, Place 1 spray into both nostrils daily as needed. Use in each nostril as directed, Disp: 30 mL, Rfl: 0 ?  cholecalciferol (VITAMIN D) 1000 units tablet, Take 1,000 Units by mouth daily., Disp: , Rfl:  ?  Eszopiclone 3 MG TABS, TAKE 1 TABLET BY MOUTH IMMEDIATELY BEFORE BEDTIME, Disp: 30 tablet, Rfl: 1 ?  irbesartan (AVAPRO) 75 MG tablet, TAKE ONE TABLET BY MOUTH EVERY DAY, Disp: 90 tablet, Rfl: 1 ?  levocetirizine (XYZAL) 5 MG tablet, Take 1 tablet (5 mg total) by mouth daily., Disp: 30 tablet, Rfl: 1 ?  metoCLOPramide (REGLAN) 5 MG tablet, Take 1 tablet (5 mg total) by mouth 3 (three)  times daily before meals., Disp: 30 tablet, Rfl: 0 ?  Multiple Vitamins-Minerals (HAIR SKIN AND NAILS FORMULA PO), Take 1 tablet by mouth daily., Disp: , Rfl:  ?  Nutritional Supplements (NUTRITIONAL SUPPLEMENT PO), Take by mouth. DoTERRA: ALPHA CRS+, xEO MEGA, MICROPLEX MVp, ON GUARD (protective blend), Disp: , Rfl:  ?  ondansetron (ZOFRAN ODT) 4 MG disintegrating tablet, Take 1 tablet (4 mg total) by mouth every 8 (eight) hours as needed for nausea or vomiting., Disp: 20 tablet, Rfl: 0 ?  pantoprazole (PROTONIX) 40 MG tablet, TAKE 1 TABLET BY MOUTH ONCE DAILY, Disp: 30 tablet, Rfl: 2 ?  rosuvastatin (CRESTOR) 10 MG tablet, Take 1 tablet (10 mg total) by mouth daily., Disp: 90 tablet, Rfl: 1 ?  sertraline (ZOLOFT) 50 MG tablet, Take 1/2 tablet q day for one week and then one per day., Disp: 30 tablet, Rfl: 3 ?  SYNTHROID 175 MCG tablet, TAKE 1 TABLET EVERY MORNING WITH A GLASSOF WATER 30 TO 60 MINUTES BEFORE BREAKFAST, Disp: 30 tablet, Rfl: 5 ? ?EXAM: ? ?GENERAL: alert, oriented, appears well and in no acute distress ? ?HEENT: atraumatic, conjunttiva clear, no obvious abnormalities on inspection of external nose and ears ? ?NECK: normal movements of the head and neck ? ?LUNGS: on inspection no signs of respiratory distress, breathing rate appears normal, no obvious gross SOB, gasping or wheezing.  Some coughing with deep inspiration and forced expiration.  ? ?CV: no obvious cyanosis ? ?PSYCH/NEURO: pleasant and cooperative, no obvious depression or anxiety, speech and thought processing grossly intact ? ?ASSESSMENT AND PLAN: ? ?Discussed the following assessment and plan: ? ?Problem List Items Addressed This Visit   ? ? Cough  ?  Symptoms appear to be c/w sinus infection/URI.  With increased loose cough, head congestion.  Continue steroid nasal spray, robitussin DM and saline nasal spray.  Will treat with zpak and prednisone taper as directed.  No chest pain or sob.  Follow.  Discussed f/u covid test.  Has had two  negative tests.  Call with update. She will call [facility] and update regarding current infection and planned treatment - discussed may need to postpone surgery.   ?  ?  ? Essential hypertension  ?  Blood pressure has been doing well.  Continue avapro.  ?  ?  ? Sinusitis  ?  Symptoms appear to be c/w sinus infection/URI.  With increased loose cough, head congestion.  Continue steroid nasal spray, robitussin DM and saline nasal spray.  Will treat with zpak and prednisone taper as directed.  No chest pain or sob.  Follow.  Discussed f/u covid test.  Has had  two negative tests.  Call with update.  ?  ?  ? Relevant Medications  ? azithromycin (ZITHROMAX) 250 MG tablet  ? predniSONE (DELTASONE) 10 MG tablet  ? ? ?Return if symptoms worsen or fail to improve, for keep scheduled. ?  ?I discussed the assessment and treatment plan with the patient. The patient was provided an opportunity to ask questions and all were answered. The patient agreed with the plan and demonstrated an understanding of the instructions. ?  ?The patient was advised to call back or seek an in-person evaluation if the symptoms worsen or if the condition fails to improve as anticipated. ? ? ? ?Einar Pheasant, MD   ?

## 2021-10-20 ENCOUNTER — Encounter: Payer: Self-pay | Admitting: Internal Medicine

## 2021-10-20 NOTE — Assessment & Plan Note (Signed)
Blood pressure has been doing well.  Continue avapro.  

## 2021-10-20 NOTE — Assessment & Plan Note (Signed)
Symptoms appear to be c/w sinus infection/URI.  With increased loose cough, head congestion.  Continue steroid nasal spray, robitussin DM and saline nasal spray.  Will treat with zpak and prednisone taper as directed.  No chest pain or sob.  Follow.  Discussed f/u covid test.  Has had two negative tests.  Call with update. She will call [facility] and update regarding current infection and planned treatment - discussed may need to postpone surgery.   ?

## 2021-10-20 NOTE — Assessment & Plan Note (Signed)
Symptoms appear to be c/w sinus infection/URI.  With increased loose cough, head congestion.  Continue steroid nasal spray, robitussin DM and saline nasal spray.  Will treat with zpak and prednisone taper as directed.  No chest pain or sob.  Follow.  Discussed f/u covid test.  Has had two negative tests.  Call with update.  ?

## 2021-10-25 ENCOUNTER — Other Ambulatory Visit: Payer: Self-pay | Admitting: Internal Medicine

## 2021-11-01 ENCOUNTER — Other Ambulatory Visit: Payer: Self-pay | Admitting: Internal Medicine

## 2021-11-08 ENCOUNTER — Other Ambulatory Visit: Payer: Self-pay | Admitting: Internal Medicine

## 2021-11-24 ENCOUNTER — Other Ambulatory Visit (INDEPENDENT_AMBULATORY_CARE_PROVIDER_SITE_OTHER): Payer: PPO

## 2021-11-24 DIAGNOSIS — E785 Hyperlipidemia, unspecified: Secondary | ICD-10-CM

## 2021-11-24 LAB — BASIC METABOLIC PANEL
BUN: 18 mg/dL (ref 6–23)
CO2: 27 mEq/L (ref 19–32)
Calcium: 9.4 mg/dL (ref 8.4–10.5)
Chloride: 102 mEq/L (ref 96–112)
Creatinine, Ser: 0.69 mg/dL (ref 0.40–1.20)
GFR: 86.69 mL/min (ref >60.00)
Glucose, Bld: 83 mg/dL (ref 70–99)
Potassium: 4.1 mEq/L (ref 3.5–5.1)
Sodium: 141 mEq/L (ref 135–145)

## 2021-11-24 LAB — LIPID PANEL
Cholesterol: 155 mg/dL (ref 0–200)
HDL: 72.9 mg/dL (ref >39.00)
LDL Cholesterol: 64 mg/dL (ref 0–99)
NonHDL: 81.86
Total CHOL/HDL Ratio: 2
Triglycerides: 91 mg/dL (ref 0.0–149.0)
VLDL: 18.2 mg/dL (ref 0.0–40.0)

## 2021-11-24 LAB — HEPATIC FUNCTION PANEL
ALT: 23 U/L (ref 0–35)
AST: 21 U/L (ref 0–37)
Albumin: 4.7 g/dL (ref 3.5–5.2)
Alkaline Phosphatase: 82 U/L (ref 39–117)
Bilirubin, Direct: 0.1 mg/dL (ref 0.0–0.3)
Total Bilirubin: 0.6 mg/dL (ref 0.2–1.2)
Total Protein: 6.2 g/dL (ref 6.0–8.3)

## 2021-11-29 ENCOUNTER — Ambulatory Visit (INDEPENDENT_AMBULATORY_CARE_PROVIDER_SITE_OTHER): Payer: PPO | Admitting: Internal Medicine

## 2021-11-29 ENCOUNTER — Encounter: Payer: Self-pay | Admitting: Internal Medicine

## 2021-11-29 VITALS — BP 134/70 | HR 76 | Temp 98.6°F | Resp 14 | Ht 62.0 in | Wt 150.6 lb

## 2021-11-29 DIAGNOSIS — Z86010 Personal history of colonic polyps: Secondary | ICD-10-CM | POA: Diagnosis not present

## 2021-11-29 DIAGNOSIS — F439 Reaction to severe stress, unspecified: Secondary | ICD-10-CM | POA: Diagnosis not present

## 2021-11-29 DIAGNOSIS — G479 Sleep disorder, unspecified: Secondary | ICD-10-CM

## 2021-11-29 DIAGNOSIS — I10 Essential (primary) hypertension: Secondary | ICD-10-CM | POA: Diagnosis not present

## 2021-11-29 DIAGNOSIS — Z0000 Encounter for general adult medical examination without abnormal findings: Secondary | ICD-10-CM

## 2021-11-29 DIAGNOSIS — E039 Hypothyroidism, unspecified: Secondary | ICD-10-CM

## 2021-11-29 DIAGNOSIS — I700 Atherosclerosis of aorta: Secondary | ICD-10-CM | POA: Diagnosis not present

## 2021-11-29 DIAGNOSIS — E785 Hyperlipidemia, unspecified: Secondary | ICD-10-CM

## 2021-11-29 NOTE — Progress Notes (Signed)
? ?Subjective:  ? ? Patient ID: [name] [name], female    DOB: 04/02/1951, 71 y.o.   MRN: [id number] ? ?This visit occurred during the SARS-CoV-2 public health emergency.  Safety protocols were in place, including screening questions prior to the visit, additional usage of staff PPE, and extensive cleaning of exam room while observing appropriate contact time as indicated for disinfecting solutions.  ? ?Patient here for her physical exam.  ? ?Chief Complaint  ?Patient presents with  ? Follow-up  ?  CPE - pt reports feeling well. Stress level is down now that mother in law is in memory facility.  ? .  ? ?HPI ?Increased stress with family medical issues.  Her mother-n-law has recently been placed in memory care.  Discussed.  Overall she appears to be handling things relatively well.  Stays active.  Plans to start exercising more.  No chest pain or sob reported.  No abdominal pain or bowel change reported.  On zoloft.  May want to taper in the future.  Will notify me if desires to taper.   ? ? ?Past Medical History:  ?Diagnosis Date  ? Adenomatous polyp 2005  ? Arrhythmia   ? H/O  ? Basal cell carcinoma   ? CTS (carpal tunnel syndrome)   ? Environmental allergies   ? History of chicken pox   ? HTN (hypertension)   ? Hypercholesterolemia   ? Hypothyroidism   ? ?Past Surgical History:  ?Procedure Laterality Date  ? BASAL CELL CARCINOMA EXCISION    ? right hip,abd, right arm, back neck  ? BREAST CYST ASPIRATION Left   ? negative  ? COLONOSCOPY WITH PROPOFOL N/A 08/24/2020  ? Procedure: COLONOSCOPY WITH PROPOFOL;  Surgeon: Lin Landsman, MD;  Location: [facility] ENDOSCOPY;  Service: Gastroenterology;  Laterality: N/A;  ? CTS releast Rt wrist    ? lipoma removal    ? Rt shoulder   ? MELANOMA EXCISION    ? right buttocks, top of scalp  ? POLYPECTOMY    ? + TA  ? TUBAL LIGATION  1982  ? ?Family History  ?Problem Relation Age of Onset  ? Cervical cancer Mother   ? Arthritis Mother   ? Hypertension Mother   ? Sudden death Mother    ?     cerbral hemorrage  ? Heart disease Father   ? Cancer Father   ?     lung cancer  ? Diabetes Maternal Grandmother   ? Breast cancer Paternal Aunt 37  ? Colon cancer Neg Hx   ? ?Social History  ? ?Socioeconomic History  ? Marital status: Widowed  ?  Spouse name: Not on file  ? Number of children: 1  ? Years of education: Not on file  ? Highest education level: Not on file  ?Occupational History  ? Occupation: [employer]/ Ortho   ?  Employer: [employer]  ?Tobacco Use  ? Smoking status: Former  ?  Types: Cigarettes  ?  Quit date: 07/16/1980  ?  Years since quitting: 41.4  ? Smokeless tobacco: Never  ?Vaping Use  ? Vaping Use: Never used  ?Substance and Sexual Activity  ? Alcohol use: Yes  ?  Alcohol/week: 7.0 standard drinks  ?  Types: 7 Glasses of wine per week  ?  Comment: 1 per day   ? Drug use: No  ? Sexual activity: Never  ?Other Topics Concern  ? Not on file  ?Social History Narrative  ? Widowed; 1 son.   ?  Retired Art therapist  ? Daily caffeine use 2-3/day   ?   ? Cell # [id number]  ? ?Social Determinants of Health  ? ?Financial Resource Strain: Not on file  ?Food Insecurity: Not on file  ?Transportation Needs: Not on file  ?Physical Activity: Not on file  ?Stress: Not on file  ?Social Connections: Not on file  ? ? ? ?Review of Systems  ?Constitutional:  Negative for appetite change and unexpected weight change.  ?HENT:  Negative for congestion, sinus pressure and sore throat.   ?Eyes:  Negative for pain and visual disturbance.  ?Respiratory:  Negative for cough, chest tightness and shortness of breath.   ?Cardiovascular:  Negative for chest pain, palpitations and leg swelling.  ?Gastrointestinal:  Negative for abdominal pain, diarrhea, nausea and vomiting.  ?Genitourinary:  Negative for difficulty urinating and dysuria.  ?Musculoskeletal:  Negative for joint swelling and myalgias.  ?Skin:  Negative for color change and rash.  ?Neurological:  Negative for dizziness, light-headedness and  headaches.  ?Hematological:  Negative for adenopathy. Does not bruise/bleed easily.  ?Psychiatric/Behavioral:  Negative for agitation and dysphoric mood.   ? ?   ?Objective:  ?  ? ?BP 134/70 (BP Location: Left Arm, Patient Position: Sitting, Cuff Size: Small)   Pulse 76   Temp 98.6 ?F (37 ?C) (Temporal)   Resp 14   Ht '5\' 2"'$  (1.575 m)   Wt 150 lb 9.6 oz (68.3 kg)   SpO2 98%   BMI 27.55 kg/m?  ?Wt Readings from Last 3 Encounters:  ?11/29/21 150 lb 9.6 oz (68.3 kg)  ?10/19/21 151 lb (68.5 kg)  ?09/29/21 151 lb 6.4 oz (68.7 kg)  ? ? ?Physical Exam ?Vitals reviewed.  ?Constitutional:   ?   General: She is not in acute distress. ?   Appearance: Normal appearance. She is well-developed.  ?HENT:  ?   Head: Normocephalic and atraumatic.  ?   Right Ear: External ear normal.  ?   Left Ear: External ear normal.  ?Eyes:  ?   General: No scleral icterus.    ?   Right eye: No discharge.     ?   Left eye: No discharge.  ?   Conjunctiva/sclera: Conjunctivae normal.  ?Neck:  ?   Thyroid: No thyromegaly.  ?Cardiovascular:  ?   Rate and Rhythm: Normal rate and regular rhythm.  ?Pulmonary:  ?   Effort: No tachypnea, accessory muscle usage or respiratory distress.  ?   Breath sounds: Normal breath sounds. No decreased breath sounds or wheezing.  ?Chest:  ?Breasts: ?   Right: No inverted nipple, mass, nipple discharge or tenderness (no axillary adenopathy).  ?   Left: No inverted nipple, mass, nipple discharge or tenderness (no axilarry adenopathy).  ?Abdominal:  ?   General: Bowel sounds are normal.  ?   Palpations: Abdomen is soft.  ?   Tenderness: There is no abdominal tenderness.  ?Musculoskeletal:     ?   General: No swelling or tenderness.  ?   Cervical back: Neck supple.  ?Lymphadenopathy:  ?   Cervical: No cervical adenopathy.  ?Skin: ?   Findings: No erythema or rash.  ?Neurological:  ?   Mental Status: She is alert and oriented to person, place, and time.  ?Psychiatric:     ?   Mood and Affect: Mood normal.     ?    Behavior: Behavior normal.  ? ? ? ?Outpatient Encounter Medications as of 11/29/2021  ?Medication Sig  ? azelastine (ASTELIN) 0.1 %  nasal spray Place 1 spray into both nostrils daily as needed. Use in each nostril as directed  ? cholecalciferol (VITAMIN D) 1000 units tablet Take 1,000 Units by mouth daily.  ? Eszopiclone 3 MG TABS TAKE 1 TABLET BY MOUTH IMMEDIATELY BEFORE BEDTIME  ? irbesartan (AVAPRO) 75 MG tablet TAKE ONE TABLET BY MOUTH EVERY DAY  ? levocetirizine (XYZAL) 5 MG tablet TAKE 1 TABLET BY MOUTH DAILY  ? metoCLOPramide (REGLAN) 5 MG tablet Take 1 tablet (5 mg total) by mouth 3 (three) times daily before meals.  ? Multiple Vitamins-Minerals (HAIR SKIN AND NAILS FORMULA PO) Take 1 tablet by mouth daily.  ? Nutritional Supplements (NUTRITIONAL SUPPLEMENT PO) Take by mouth. DoTERRA: ALPHA CRS+, xEO MEGA, MICROPLEX MVp, ON GUARD (protective blend)  ? ondansetron (ZOFRAN ODT) 4 MG disintegrating tablet Take 1 tablet (4 mg total) by mouth every 8 (eight) hours as needed for nausea or vomiting.  ? pantoprazole (PROTONIX) 40 MG tablet TAKE 1 TABLET BY MOUTH ONCE DAILY  ? predniSONE (DELTASONE) 10 MG tablet Take 4 tablets x 1 day and then decrease by 1/2 tablet per day until down to zero mg.  ? rosuvastatin (CRESTOR) 10 MG tablet Take 1 tablet (10 mg total) by mouth daily.  ? sertraline (ZOLOFT) 50 MG tablet TAKE 1/2 TABLET DAILY FOR 1 WEEK THEN INCREASE TO 1 TABLET DAILY  ? SYNTHROID 175 MCG tablet TAKE 1 TABLET EVERY MORNING WITH A GLASSOF WATER 30 TO 60 MINUTES BEFORE BREAKFAST  ? ?No facility-administered encounter medications on file as of 11/29/2021.  ?  ? ?Lab Results  ?Component Value Date  ? WBC 6.1 07/12/2021  ? HGB 13.0 07/12/2021  ? HCT 38.5 07/12/2021  ? PLT 228.0 07/12/2021  ? GLUCOSE 83 11/24/2021  ? CHOL 155 11/24/2021  ? TRIG 91.0 11/24/2021  ? HDL 72.90 11/24/2021  ? LDLDIRECT 118.2 10/16/2006  ? [place] 64 11/24/2021  ? ALT 23 11/24/2021  ? AST 21 11/24/2021  ? NA 141 11/24/2021  ? K 4.1  11/24/2021  ? CL 102 11/24/2021  ? CREATININE 0.69 11/24/2021  ? BUN 18 11/24/2021  ? CO2 27 11/24/2021  ? TSH 0.77 03/09/2021  ? ? ?MM 3D SCREEN BREAST BILATERAL ? ?Result Date: 09/04/2020 ?CLINICAL DATA:  Screening. EXAM: DIG

## 2021-11-29 NOTE — Assessment & Plan Note (Signed)
Physical today 11/29/21.  Colonoscopy 08/2020 - non bleeding external hemorrhoids.  Recommended f/u in 10 years.  Mammogram 09/04/20 - birads I.  Scheduled for mammogram 12/27/21.   ?

## 2021-12-05 ENCOUNTER — Encounter: Payer: Self-pay | Admitting: Internal Medicine

## 2021-12-05 NOTE — Assessment & Plan Note (Signed)
Continue crestor 

## 2021-12-05 NOTE — Assessment & Plan Note (Signed)
On crestor.  Low cholesterol diet and exercise.  Follow lipid panel and liver function tests.   

## 2021-12-05 NOTE — Assessment & Plan Note (Signed)
Colonoscopy 08/24/20 - recommended f/u in 10 years.  

## 2021-12-05 NOTE — Assessment & Plan Note (Addendum)
Increased stress with family medical issues.  Her mother-n-law has recently been placed in memory care.  Discussed.  Overall she appears to be handling things relatively well. On zoloft.  May want to taper in future.  Will notify me if desires to taper.  ?

## 2021-12-05 NOTE — Assessment & Plan Note (Signed)
Taking 1/2 lunesta.  Follow.   ?

## 2021-12-05 NOTE — Assessment & Plan Note (Signed)
On thyroid replacement.  Follow tsh.  

## 2021-12-05 NOTE — Assessment & Plan Note (Signed)
Blood pressure has been doing well.  Continue avapro.  

## 2021-12-06 ENCOUNTER — Encounter: Payer: Self-pay | Admitting: Internal Medicine

## 2021-12-13 ENCOUNTER — Encounter: Payer: Self-pay | Admitting: Internal Medicine

## 2021-12-13 NOTE — Telephone Encounter (Signed)
S/w pt - agreed to video visit with Dr Nicki Reaper tomorrow 8am ?

## 2021-12-14 ENCOUNTER — Telehealth (INDEPENDENT_AMBULATORY_CARE_PROVIDER_SITE_OTHER): Payer: PPO | Admitting: Internal Medicine

## 2021-12-14 DIAGNOSIS — J329 Chronic sinusitis, unspecified: Secondary | ICD-10-CM

## 2021-12-14 DIAGNOSIS — I10 Essential (primary) hypertension: Secondary | ICD-10-CM

## 2021-12-14 MED ORDER — DOXYCYCLINE HYCLATE 100 MG PO TABS: 100.0000 mg | ORAL_TABLET | Freq: Two times a day (BID) | ORAL | 0 refills | Status: DC

## 2021-12-14 NOTE — Progress Notes (Signed)
Patient ID: [name] [name], female   DOB: [birth year], 71 y.o.   MRN: [id number] ? ? ?Virtual Visit via video Note ? ?All issues noted in this document were discussed and addressed.  No physical exam was performed (except for noted visual exam findings with Video Visits).  ? ?I connected with [name] today by a video enabled telemedicine application and verified that I am speaking with the correct person using two identifiers. ?Location patient: home ?Location provider: work ?Persons participating in the virtual visit: patient, provider ? ?The limitations, risks, security and privacy concerns of performing an evaluation and management service by video and the availability of in person appointments have been discussed.  It has also been discussed with the patient that there may be a patient responsible charge related to this service. The patient expressed understanding and agreed to proceed. ? ? ?Reason for visit: work in appt.  ? ?HPI: ?Work in with concerns regarding a possible sinus infection.  Symptoms started one week ago.  Granddaughter - sick.  Noticed headache, drainage and cough.  Some aching.  No vomiting or diarrhea.  Taking xyzal.  Using astelin and nasacort.  Due to f/u with ENT 12/20/21.   ? ? ?ROS: See pertinent positives and negatives per HPI. ? ?Past Medical History:  ?Diagnosis Date  ? Adenomatous polyp 2005  ? Arrhythmia   ? H/O  ? Basal cell carcinoma   ? CTS (carpal tunnel syndrome)   ? Environmental allergies   ? History of chicken pox   ? HTN (hypertension)   ? Hypercholesterolemia   ? Hypothyroidism   ? ? ?Past Surgical History:  ?Procedure Laterality Date  ? BASAL CELL CARCINOMA EXCISION    ? right hip,abd, right arm, back neck  ? BREAST CYST ASPIRATION Left   ? negative  ? COLONOSCOPY WITH PROPOFOL N/A 08/24/2020  ? Procedure: COLONOSCOPY WITH PROPOFOL;  Surgeon: Lin Landsman, MD;  Location: [facility] ENDOSCOPY;  Service: Gastroenterology;  Laterality: N/A;  ? CTS releast Rt wrist    ? lipoma  removal    ? Rt shoulder   ? MELANOMA EXCISION    ? right buttocks, top of scalp  ? POLYPECTOMY    ? + TA  ? TUBAL LIGATION  1982  ? ? ?Family History  ?Problem Relation Age of Onset  ? Cervical cancer Mother   ? Arthritis Mother   ? Hypertension Mother   ? Sudden death Mother   ?     cerbral hemorrage  ? Heart disease Father   ? Cancer Father   ?     lung cancer  ? Diabetes Maternal Grandmother   ? Breast cancer Paternal Aunt 38  ? Colon cancer Neg Hx   ? ? ?SOCIAL HX: reviewed ? ? ?Current Outpatient Medications:  ?  doxycycline (VIBRA-TABS) 100 MG tablet, Take 1 tablet (100 mg total) by mouth 2 (two) times daily., Disp: 20 tablet, Rfl: 0 ?  azelastine (ASTELIN) 0.1 % nasal spray, USE 1 SPRAY IN BOTH NOSTRILS DAILY AS NEEDED, Disp: 30 mL, Rfl: 0 ?  cholecalciferol (VITAMIN D) 1000 units tablet, Take 1,000 Units by mouth daily., Disp: , Rfl:  ?  Eszopiclone 3 MG TABS, TAKE 1 TABLET BY MOUTH IMMEDIATELY BEFORE BEDTIME, Disp: 30 tablet, Rfl: 0 ?  irbesartan (AVAPRO) 75 MG tablet, TAKE ONE TABLET BY MOUTH EVERY DAY, Disp: 90 tablet, Rfl: 1 ?  levocetirizine (XYZAL) 5 MG tablet, TAKE 1 TABLET BY MOUTH DAILY, Disp: 30 tablet, Rfl: 1 ?  metoCLOPramide (REGLAN) 5 MG tablet, Take 1 tablet (5 mg total) by mouth 3 (three) times daily before meals., Disp: 30 tablet, Rfl: 0 ?  Multiple Vitamins-Minerals (HAIR SKIN AND NAILS FORMULA PO), Take 1 tablet by mouth daily., Disp: , Rfl:  ?  Nutritional Supplements (NUTRITIONAL SUPPLEMENT PO), Take by mouth. DoTERRA: ALPHA CRS+, xEO MEGA, MICROPLEX MVp, ON GUARD (protective blend), Disp: , Rfl:  ?  ondansetron (ZOFRAN ODT) 4 MG disintegrating tablet, Take 1 tablet (4 mg total) by mouth every 8 (eight) hours as needed for nausea or vomiting., Disp: 20 tablet, Rfl: 0 ?  pantoprazole (PROTONIX) 40 MG tablet, TAKE 1 TABLET BY MOUTH ONCE DAILY, Disp: 30 tablet, Rfl: 2 ?  predniSONE (DELTASONE) 10 MG tablet, Take 4 tablets x 1 day and then decrease by 1/2 tablet per day until down to zero  mg., Disp: 18 tablet, Rfl: 0 ?  rosuvastatin (CRESTOR) 10 MG tablet, Take 1 tablet (10 mg total) by mouth daily., Disp: 90 tablet, Rfl: 1 ?  sertraline (ZOLOFT) 50 MG tablet, TAKE 1/2 TABLET DAILY FOR 1 WEEK THEN INCREASE TO 1 TABLET DAILY, Disp: 30 tablet, Rfl: 3 ?  SYNTHROID 175 MCG tablet, TAKE 1 TABLET EVERY MORNING WITH A GLASSOF WATER 30 TO 60 MINUTES BEFORE BREAKFAST, Disp: 30 tablet, Rfl: 5 ? ?EXAM: ? ?VITALS per patient if applicable: 38.4 ? ?GENERAL: alert, oriented, appears well and in no acute distress ? ?HEENT: atraumatic, conjunttiva clear, no obvious abnormalities on inspection of external nose and ears ? ?NECK: normal movements of the head and neck ? ?LUNGS: on inspection no signs of respiratory distress, breathing rate appears normal, no obvious gross SOB, gasping or wheezing ? ?CV: no obvious cyanosis ? ?PSYCH/NEURO: pleasant and cooperative, no obvious depression or anxiety, speech and thought processing grossly intact ? ?ASSESSMENT AND PLAN: ? ?Discussed the following assessment and plan: ? ?Problem List Items Addressed This Visit   ? ? Essential hypertension  ?  Blood pressure has been doing well.  Continue avapro.  ? ?  ?  ? Sinusitis  ?  Symptoms appear to be c/w sinus infection.  Continue steroid nasal spray, astelin nasal spray and saline nasal spray.  No chest pain or sob.  Taking xyzal. Has had negative covid test. Doxycycline as directed.  Call with update. Has appt with Dr Richardson Landry next week.  ? ?  ?  ? Relevant Medications  ? doxycycline (VIBRA-TABS) 100 MG tablet  ? ? ?Return if symptoms worsen or fail to improve, for keep scheduled. ?  ?I discussed the assessment and treatment plan with the patient. The patient was provided an opportunity to ask questions and all were answered. The patient agreed with the plan and demonstrated an understanding of the instructions. ?  ?The patient was advised to call back or seek an in-person evaluation if the symptoms worsen or if the condition fails  to improve as anticipated. ? ? ? ?Einar Pheasant, MD   ?

## 2021-12-20 ENCOUNTER — Other Ambulatory Visit: Payer: Self-pay | Admitting: Internal Medicine

## 2021-12-20 DIAGNOSIS — J329 Chronic sinusitis, unspecified: Secondary | ICD-10-CM | POA: Diagnosis not present

## 2021-12-20 DIAGNOSIS — J301 Allergic rhinitis due to pollen: Secondary | ICD-10-CM | POA: Diagnosis not present

## 2021-12-21 ENCOUNTER — Telehealth: Payer: Self-pay | Admitting: Internal Medicine

## 2021-12-21 NOTE — Telephone Encounter (Signed)
Copied from [place]. Topic: Medicare AWV ?>> Dec 21, 2021  1:19 PM Harris-Coley, Hannah Beat wrote: ?Reason for CRM: Attempted to schedule AWV. Unable to LVM.  Will try at later time. ?

## 2021-12-25 ENCOUNTER — Encounter: Payer: Self-pay | Admitting: Internal Medicine

## 2021-12-25 NOTE — Assessment & Plan Note (Signed)
Symptoms appear to be c/w sinus infection.  Continue steroid nasal spray, astelin nasal spray and saline nasal spray.  No chest pain or sob.  Taking xyzal. Has had negative covid test. Doxycycline as directed.  Call with update. Has appt with Dr Richardson Landry next week.  ?

## 2021-12-25 NOTE — Assessment & Plan Note (Signed)
Blood pressure has been doing well.  Continue avapro.  

## 2021-12-27 ENCOUNTER — Ambulatory Visit
Admission: RE | Admit: 2021-12-27 | Discharge: 2021-12-27 | Disposition: A | Discharge status: Home / Self Care | Payer: PPO | Source: Ambulatory Visit | Attending: Internal Medicine | Admitting: Internal Medicine

## 2021-12-27 ENCOUNTER — Other Ambulatory Visit: Payer: Self-pay | Admitting: Internal Medicine

## 2021-12-27 ENCOUNTER — Telehealth: Payer: Self-pay | Admitting: Family

## 2021-12-27 DIAGNOSIS — Z1231 Encounter for screening mammogram for malignant neoplasm of breast: Secondary | ICD-10-CM | POA: Insufficient documentation

## 2021-12-28 ENCOUNTER — Other Ambulatory Visit: Payer: Self-pay

## 2021-12-28 MED ORDER — ESZOPICLONE 3 MG PO TABS: ORAL_TABLET | ORAL | 1 refills | Status: DC

## 2021-12-28 NOTE — Telephone Encounter (Signed)
Pt need refill on Eszopiclone sent to [facility] ?

## 2021-12-28 NOTE — Telephone Encounter (Signed)
Rx ok'd for lunesta #30 with one refill.  

## 2021-12-28 NOTE — Telephone Encounter (Signed)
Sent to Dr Nicki Reaper for controlled substance approval ?

## 2022-01-04 ENCOUNTER — Other Ambulatory Visit: Payer: Self-pay | Admitting: Internal Medicine

## 2022-01-13 ENCOUNTER — Other Ambulatory Visit: Payer: Self-pay | Admitting: Internal Medicine

## 2022-01-13 DIAGNOSIS — J329 Chronic sinusitis, unspecified: Secondary | ICD-10-CM | POA: Diagnosis not present

## 2022-01-13 DIAGNOSIS — H6123 Impacted cerumen, bilateral: Secondary | ICD-10-CM | POA: Diagnosis not present

## 2022-01-13 DIAGNOSIS — J301 Allergic rhinitis due to pollen: Secondary | ICD-10-CM | POA: Diagnosis not present

## 2022-02-01 ENCOUNTER — Other Ambulatory Visit: Payer: Self-pay | Admitting: Internal Medicine

## 2022-02-14 ENCOUNTER — Telehealth: Payer: Self-pay | Admitting: Internal Medicine

## 2022-02-14 NOTE — Telephone Encounter (Signed)
Copied from [place] [id number]. Topic: Medicare AWV >> Feb 14, 2022 10:23 AM Devoria Glassing wrote: Reason for CRM: Left message for patient to schedule Annual Wellness Visit.  Please schedule with Nurse Health Advisor Denisa O'Brien-Blaney, LPN at [facility].  Please call [phone] ask for [facility]

## 2022-02-18 ENCOUNTER — Telehealth: Payer: Self-pay

## 2022-02-18 ENCOUNTER — Encounter: Payer: Self-pay | Admitting: Internal Medicine

## 2022-02-18 ENCOUNTER — Other Ambulatory Visit: Payer: Self-pay

## 2022-02-18 MED ORDER — ESZOPICLONE 3 MG PO TABS
ORAL_TABLET | ORAL | 1 refills | Status: DC
Start: 2022-02-18 — End: 2022-06-07

## 2022-02-18 NOTE — Telephone Encounter (Signed)
Patient also stated that she is almost out and won't have enough Eszopiclone 3 MG TABS to make it to 02/24/2022.

## 2022-02-18 NOTE — Telephone Encounter (Signed)
Sig updated. Rx sent to Dr Nicki Reaper for signature.

## 2022-02-18 NOTE — Telephone Encounter (Signed)
Patient states she needs a refill for her Eszopiclone 3 MG TABS.  Patient states her pharmacy told her that it is not refillable until 02/24/2022.  Patient states she has been helping with her mother-in-law who is dealing with dementia, and sometimes, at bedtime, will need to have one whole tablet instead of the half of a tablet which was prescribed.  Patient states she spoke with her pharmacy and they told her if the prescription is reworded to state, "1/2 to 1 tablet every night" then they can go ahead and refill it.  *Patient states her preferred pharmacy is [facility].

## 2022-02-23 ENCOUNTER — Ambulatory Visit (INDEPENDENT_AMBULATORY_CARE_PROVIDER_SITE_OTHER): Payer: PPO

## 2022-02-23 VITALS — Ht 62.0 in | Wt 147.0 lb

## 2022-02-23 DIAGNOSIS — Z0000 Encounter for general adult medical examination without abnormal findings: Secondary | ICD-10-CM

## 2022-02-23 NOTE — Patient Instructions (Addendum)
  Ms. Vanderweide , Thank you for taking time to come for your Medicare Wellness Visit. I appreciate your ongoing commitment to your health goals. Please review the following plan we discussed and let me know if I can assist you in the future.   These are the goals we discussed:  Goals      Weight goal 140lb     Healthy diet Stay active, add drum exercises          This is a list of the screening recommended for you and due dates:  Health Maintenance  Topic Date Due   COVID-19 Vaccine (4 - Booster for Pfizer series) 03/11/2022*   Tetanus Vaccine  04/15/2022*   Flu Shot  03/15/2022   Mammogram  12/28/2022   Colon Cancer Screening  08/24/2030   Pneumonia Vaccine  Completed   DEXA scan (bone density measurement)  Completed   Hepatitis C Screening: USPSTF Recommendation to screen - Ages 20-79 yo.  Completed   Zoster (Shingles) Vaccine  Completed   HPV Vaccine  Aged Out  *Topic was postponed. The date shown is not the original due date.

## 2022-02-23 NOTE — Progress Notes (Addendum)
Subjective:   [name] [name] is a 70 y.o. female who presents for Medicare Annual (Subsequent) preventive examination.  Review of Systems    No ROS.  Medicare Wellness Virtual Visit.  Visual/audio telehealth visit, UTA vital signs.   See social history for additional risk factors.   Cardiac Risk Factors include: advanced age (>55mn, >>60women)     Objective:    Today's Vitals   02/23/22 1502  Weight: 147 lb (66.7 kg)  Height: '5\' 2"'$  (1.575 m)   Body mass index is 26.89 kg/m.     02/23/2022    3:08 PM 08/24/2020    8:51 AM 06/15/2020    3:13 PM 06/14/2020    8:07 PM 06/13/2020   11:19 PM 02/24/2020    9:42 AM 02/21/2019   10:07 AM  Advanced Directives  Does Patient Have a Medical Advance Directive? Yes Yes No No No Yes Yes  Type of AParamed[place]of [place] will     [place]  Does patient want to make changes to medical advance directive? No - Patient declined     No - Patient declined No - Patient declined  Copy of HP[place]in Chart? Yes - validated most recent copy scanned in chart (See row information)     Yes - validated most recent copy scanned in chart (See row information) Yes - validated most recent copy scanned in chart (See row information)  Would patient like information on creating a medical advance directive?   No - Patient declined  No - Patient declined      Current Medications (verified) Outpatient Encounter Medications as of 02/23/2022  Medication Sig   azelastine (ASTELIN) 0.1 % nasal spray USE 1 SPRAY IN BOTH NOSTRILS DAILY AS NEEDED   cholecalciferol (VITAMIN D) 1000 units tablet Take 1,000 Units by mouth daily.   doxycycline (VIBRA-TABS) 100 MG tablet Take 1 tablet (100 mg total) by mouth 2 (two) times daily.   Eszopiclone 3 MG TABS Take 1/2 - 1 tablet immediately before bedtime   irbesartan (AVAPRO) 75 MG tablet TAKE ONE TABLET BY MOUTH  EVERY DAY   levocetirizine (XYZAL) 5 MG tablet TAKE 1 TABLET BY MOUTH DAILY   metoCLOPramide (REGLAN) 5 MG tablet Take 1 tablet (5 mg total) by mouth 3 (three) times daily before meals.   Multiple Vitamins-Minerals (HAIR SKIN AND NAILS FORMULA PO) Take 1 tablet by mouth daily.   Nutritional Supplements (NUTRITIONAL SUPPLEMENT PO) Take by mouth. DoTERRA: ALPHA CRS+, xEO MEGA, MICROPLEX MVp, ON GUARD (protective blend)   ondansetron (ZOFRAN ODT) 4 MG disintegrating tablet Take 1 tablet (4 mg total) by mouth every 8 (eight) hours as needed for nausea or vomiting.   pantoprazole (PROTONIX) 40 MG tablet TAKE 1 TABLET BY MOUTH DAILY   predniSONE (DELTASONE) 10 MG tablet Take 4 tablets x 1 day and then decrease by 1/2 tablet per day until down to zero mg.   rosuvastatin (CRESTOR) 10 MG tablet TAKE 1 TABLET BY MOUTH DAILY   sertraline (ZOLOFT) 50 MG tablet TAKE 1/2 TABLET DAILY FOR 1 WEEK THEN INCREASE TO 1 TABLET DAILY   SYNTHROID 175 MCG tablet TAKE 1 TABLET EVERY MORNING WITH A GLASSOF WATER 30 TO 60 MINUTES BEFORE BREAKFAST   No facility-administered encounter medications on file as of 02/23/2022.    Allergies (verified) Codeine and Penicillins   History: Past Medical History:  Diagnosis Date   Adenomatous polyp 2005  Arrhythmia    H/O   Basal cell carcinoma    CTS (carpal tunnel syndrome)    Environmental allergies    History of chicken pox    HTN (hypertension)    Hypercholesterolemia    Hypothyroidism    Past Surgical History:  Procedure Laterality Date   BASAL CELL CARCINOMA EXCISION     right hip,abd, right arm, back neck   BREAST CYST ASPIRATION Left    negative   COLONOSCOPY WITH PROPOFOL N/A 08/24/2020   Procedure: COLONOSCOPY WITH PROPOFOL;  Surgeon: Lin Landsman, MD;  Location: [facility];  Service: Gastroenterology;  Laterality: N/A;   CTS releast Rt wrist     lipoma removal     Rt shoulder    MELANOMA EXCISION     right buttocks, top of scalp    POLYPECTOMY     + TA   TUBAL LIGATION  1982   Family History  Problem Relation Age of Onset   Cervical cancer Mother    Arthritis Mother    Hypertension Mother    Sudden death Mother        cerbral hemorrage   Heart disease Father    Cancer Father        lung cancer   Diabetes Maternal Grandmother    Breast cancer Paternal Aunt 38   Colon cancer Neg Hx    Social History   Socioeconomic History   Marital status: Widowed    Spouse name: Not on file   Number of children: 1   Years of education: Not on file   Highest education level: Not on file  Occupational History   Occupation: [employer]/ Ortho     Employer: [employer]  Tobacco Use   Smoking status: Former    Types: Cigarettes    Quit date: 07/16/1980    Years since quitting: 41.6   Smokeless tobacco: Never  Vaping Use   Vaping Use: Never used  Substance and Sexual Activity   Alcohol use: Yes    Alcohol/week: 7.0 standard drinks of alcohol    Types: 7 Glasses of wine per week    Comment: 1 per day    Drug use: No   Sexual activity: Never  Other Topics Concern   Not on file  Social History Narrative   Widowed; 1 son.    Retired Art therapist   Daily caffeine use 2-3/day       Cell # [id number]   Social Determinants of Health   Financial Resource Strain: Low Risk  (02/23/2022)   Overall Financial Resource Strain (CARDIA)    Difficulty of Paying Living Expenses: Not hard at all  Food Insecurity: No Food Insecurity (02/23/2022)   Hunger Vital Sign    Worried About Running Out of Food in the Last Year: Never true    [place] in the Last Year: Never true  Transportation Needs: No Transportation Needs (02/23/2022)   PRAPARE - Hydrologist (Medical): No    Lack of Transportation (Non-Medical): No  Physical Activity: Sufficiently Active (02/23/2022)   Exercise Vital Sign    Days of Exercise per Week: 7 days    Minutes of Exercise per Session: 60 min  Stress: No  Stress Concern Present (02/23/2022)   [place]    Feeling of Stress : Not at all  Social Connections: Unknown (02/23/2022)   Social Connection and Isolation Panel [NHANES]    Frequency of Communication  with Friends and Family: More than three times a week    Frequency of Social Gatherings with Friends and Family: More than three times a week    Attends Religious Services: Not on Advertising copywriter or Organizations: Not on file    Attends Archivist Meetings: Not on file    Marital Status: Not on file    Tobacco Counseling Counseling given: Not Answered   Clinical Intake:  Pre-visit preparation completed: Yes        Diabetes: No  How often do you need to have someone help you when you read instructions, pamphlets, or other written materials from your doctor or pharmacy?: 1 - Never  Interpreter Needed?: No      Activities of Daily Living    02/23/2022    3:05 PM  In your present state of health, do you have any difficulty performing the following activities:  Hearing? 0  Vision? 0  Difficulty concentrating or making decisions? 0  Walking or climbing stairs? 0  Dressing or bathing? 0  Doing errands, shopping? 0  Preparing Food and eating ? N  Using the Toilet? N  In the past six months, have you accidently leaked urine? N  Do you have problems with loss of bowel control? N  Managing your Medications? N  Managing your Finances? N  Housekeeping or managing your Housekeeping? N    Patient Care Team: Einar Pheasant, MD as PCP - General (Internal Medicine)  Indicate any recent Medical Services you may have received from other than [facility] providers in the past year (date may be approximate).     Assessment:   This is a routine wellness examination for [name] [name].  Virtual Visit via Telephone Note  I connected with  [name] [name] on 02/23/22 at  3:00 PM EDT by telephone and verified  that I am speaking with the correct person using two identifiers.  Persons participating in the virtual visit: patient/Nurse Health Advisor   I discussed the limitations of performing an evaluation and management service by telehealth. We continued and completed visit with audio only. Some vital signs may be absent or patient reported.   Hearing/Vision screen Hearing Screening - Comments:: Patient is able to hear conversational tones without difficulty. No issues reported. Vision Screening - Comments:: Followed by [facility] Wears corrective lenses They have seen their ophthalmologist in the last 12 months.   Dietary issues and exercise activities discussed: Current Exercise Habits: Structured exercise class;Home exercise routine, Time (Minutes): 60, Frequency (Times/Week): 2, Weekly Exercise (Minutes/Week): 120, Intensity: Mild   Goals Addressed               This Visit's Progress     Patient Stated     COMPLETED: DIET - INCREASE LEAN PROTEINS (pt-stated)        Low carb intake Lose weight Reduce sugar intake      Other     Weight goal 140lb        Healthy diet Stay active, add drum exercises         Depression Screen    02/23/2022    3:07 PM 11/29/2021    9:24 AM 07/13/2021    9:13 AM 11/03/2020   12:18 PM 02/24/2020    9:44 AM 02/21/2019   10:09 AM 02/19/2018   11:47 AM  PHQ 2/9 Scores  PHQ - 2 Score 0 0 0 0 0 0 0  PHQ- 9 Score    1  Fall Risk    02/23/2022    3:09 PM 11/29/2021    9:24 AM 07/13/2021    9:12 AM 03/11/2021    9:56 AM 02/24/2020    9:43 AM  Fall Risk   Falls in the past year? 0 0 0 0 0  Number falls in past yr: 0  0 0 0  Injury with Fall?   0 0   Risk for fall due to :  No Fall Risks No Fall Risks No Fall Risks   Follow up Falls evaluation completed Falls evaluation completed Falls evaluation completed  Falls evaluation completed    [place]: Home free of loose throw rugs in walkways, pet  beds, electrical cords, etc? Yes  Adequate lighting in your home to reduce risk of falls? Yes   ASSISTIVE DEVICES UTILIZED TO PREVENT FALLS: Life alert? No  Use of a cane, walker or w/c? No   TIMED UP AND GO: Was the test performed? No .   Cognitive Function: Patient is alert and oriented x3.      02/19/2018   11:50 AM 02/17/2017    3:08 PM  MMSE - Mini Mental State Exam  Orientation to time 5 5  Orientation to Place 5 5  Registration 3 3  Attention/ Calculation 5 5  Recall 3 3  Language- name 2 objects 2 2  Language- repeat 1 1  Language- follow 3 step command 3 3  Language- read & follow direction 1 1  Write a sentence 1 1  Copy design 1 1  Total score 30 30        02/24/2020    9:45 AM 02/21/2019   10:09 AM  6CIT Screen  What Year? 0 points 0 points  What month? 0 points 0 points  What time?  0 points  Count back from 20  0 points  Months in reverse  0 points  Repeat phrase  0 points  Total Score  0 points    Immunizations Immunization History  Administered Date(s) Administered   Influenza Split 05/06/2014   Influenza, High Dose Seasonal PF 04/14/2016, 05/25/2018, 05/24/2019   Influenza,inj,Quad PF,6+ Mos 05/13/2015   Influenza-Unspecified 05/26/2020, 06/08/2021   PFIZER(Purple Top)SARS-COV-2 Vaccination 09/21/2019, 10/15/2019, 06/01/2020   Pneumococcal Conjugate-13 06/20/2014   Pneumococcal Polysaccharide-23 06/25/2015   Tdap 05/20/2011   Zoster Recombinat (Shingrix) 04/26/2018, 07/05/2018   Zoster, Live 09/20/2013    TDAP status: Due, Education has been provided regarding the importance of this vaccine. Advised may receive this vaccine at local pharmacy or Health Dept. Aware to provide a copy of the vaccination record if obtained from local pharmacy or Health Dept. Verbalized acceptance and understanding.  Screening Tests Health Maintenance  Topic Date Due   COVID-19 Vaccine (4 - Booster for Pfizer series) 03/11/2022 (Originally 07/27/2020)    TETANUS/TDAP  04/15/2022 (Originally 05/19/2021)   INFLUENZA VACCINE  03/15/2022   MAMMOGRAM  12/28/2022   COLONOSCOPY (Pts 45-98yr Insurance coverage will need to be confirmed)  08/24/2030   Pneumonia Vaccine 71 Years old  Completed   DEXA SCAN  Completed   Hepatitis C Screening  Completed   Zoster Vaccines- Shingrix  Completed   HPV VACCINES  Aged Out   Health Maintenance There are no preventive care reminders to display for this patient.  Lung Cancer Screening: (Low Dose CT Chest recommended if Age 73-80years, 30 pack-year currently smoking OR have quit w/in 15years.) does not qualify.   Vision Screening: Recommended annual ophthalmology exams  for early detection of glaucoma and other disorders of the eye.  Dental Screening: Recommended annual dental exams for proper oral hygiene  Community Resource Referral / Chronic Care Management: CRR required this visit?  No   CCM required this visit?  No      Plan:   Keep all routine maintenance appointments.   I have personally reviewed and noted the following in the patient's chart:   Medical and social history Use of alcohol, tobacco or illicit drugs  Current medications and supplements including opioid prescriptions.  Functional ability and status Nutritional status Physical activity Advanced directives List of other physicians Hospitalizations, surgeries, and ER visits in previous 12 months Vitals Screenings to include cognitive, depression, and falls Referrals and appointments  In addition, I have reviewed and discussed with patient certain preventive protocols, quality metrics, and best practice recommendations. A written personalized care plan for preventive services as well as general preventive health recommendations were provided to patient.     OBrien-Blaney, Trek Kimball L, LPN   5/00/9381    I have reviewed the above information and agree with above.   Deborra Medina, MD

## 2022-03-15 ENCOUNTER — Encounter: Payer: Self-pay | Admitting: Internal Medicine

## 2022-03-15 DIAGNOSIS — G5600 Carpal tunnel syndrome, unspecified upper limb: Secondary | ICD-10-CM | POA: Insufficient documentation

## 2022-03-28 ENCOUNTER — Other Ambulatory Visit (INDEPENDENT_AMBULATORY_CARE_PROVIDER_SITE_OTHER): Payer: PPO

## 2022-03-28 DIAGNOSIS — E785 Hyperlipidemia, unspecified: Secondary | ICD-10-CM

## 2022-03-28 DIAGNOSIS — I10 Essential (primary) hypertension: Secondary | ICD-10-CM | POA: Diagnosis not present

## 2022-03-28 DIAGNOSIS — E039 Hypothyroidism, unspecified: Secondary | ICD-10-CM

## 2022-03-28 LAB — CBC WITH DIFFERENTIAL/PLATELET
Basophils Absolute: 0 10*3/uL (ref 0.0–0.1)
Basophils Relative: 0.6 % (ref 0.0–3.0)
Eosinophils Absolute: 0.5 10*3/uL (ref 0.0–0.7)
Eosinophils Relative: 8.2 % — ABNORMAL HIGH (ref 0.0–5.0)
HCT: 38.1 % (ref 36.0–46.0)
Hemoglobin: 13.2 g/dL (ref 12.0–15.0)
Lymphocytes Relative: 27.3 % (ref 12.0–46.0)
Lymphs Abs: 1.6 10*3/uL (ref 0.7–4.0)
MCHC: 34.6 g/dL (ref 30.0–36.0)
MCV: 86.8 fl (ref 78.0–100.0)
Monocytes Absolute: 0.5 10*3/uL (ref 0.1–1.0)
Monocytes Relative: 8.3 % (ref 3.0–12.0)
Neutro Abs: 3.2 10*3/uL (ref 1.4–7.7)
Neutrophils Relative %: 55.6 % (ref 43.0–77.0)
Platelets: 234 10*3/uL (ref 150.0–400.0)
RBC: 4.39 Mil/uL (ref 3.87–5.11)
RDW: 12.4 % (ref 11.5–15.5)
WBC: 5.9 10*3/uL (ref 4.0–10.5)

## 2022-03-28 LAB — LIPID PANEL
Cholesterol: 170 mg/dL (ref 0–200)
HDL: 72.6 mg/dL (ref >39.00)
LDL Cholesterol: 82 mg/dL (ref 0–99)
NonHDL: 97.41
Total CHOL/HDL Ratio: 2
Triglycerides: 79 mg/dL (ref 0.0–149.0)
VLDL: 15.8 mg/dL (ref 0.0–40.0)

## 2022-03-28 LAB — BASIC METABOLIC PANEL WITH GFR
BUN: 24 mg/dL — ABNORMAL HIGH (ref 6–23)
CO2: 25 meq/L (ref 19–32)
Calcium: 9.1 mg/dL (ref 8.4–10.5)
Chloride: 105 meq/L (ref 96–112)
Creatinine, Ser: 0.63 mg/dL (ref 0.40–1.20)
GFR: 88.4 mL/min
Glucose, Bld: 99 mg/dL (ref 70–99)
Potassium: 3.9 meq/L (ref 3.5–5.1)
Sodium: 137 meq/L (ref 135–145)

## 2022-03-28 LAB — HEPATIC FUNCTION PANEL
ALT: 18 U/L (ref 0–35)
AST: 16 U/L (ref 0–37)
Albumin: 4.3 g/dL (ref 3.5–5.2)
Alkaline Phosphatase: 84 U/L (ref 39–117)
Bilirubin, Direct: 0.1 mg/dL (ref 0.0–0.3)
Total Bilirubin: 0.4 mg/dL (ref 0.2–1.2)
Total Protein: 6.3 g/dL (ref 6.0–8.3)

## 2022-03-28 LAB — TSH: TSH: 0.49 u[IU]/mL (ref 0.35–5.50)

## 2022-03-31 ENCOUNTER — Ambulatory Visit: Payer: PPO | Admitting: Internal Medicine

## 2022-04-05 ENCOUNTER — Encounter: Payer: Self-pay | Admitting: Internal Medicine

## 2022-04-05 ENCOUNTER — Ambulatory Visit (INDEPENDENT_AMBULATORY_CARE_PROVIDER_SITE_OTHER): Payer: PPO | Admitting: Internal Medicine

## 2022-04-05 DIAGNOSIS — I700 Atherosclerosis of aorta: Secondary | ICD-10-CM | POA: Diagnosis not present

## 2022-04-05 DIAGNOSIS — I10 Essential (primary) hypertension: Secondary | ICD-10-CM

## 2022-04-05 DIAGNOSIS — E785 Hyperlipidemia, unspecified: Secondary | ICD-10-CM

## 2022-04-05 DIAGNOSIS — G5600 Carpal tunnel syndrome, unspecified upper limb: Secondary | ICD-10-CM

## 2022-04-05 DIAGNOSIS — F439 Reaction to severe stress, unspecified: Secondary | ICD-10-CM | POA: Diagnosis not present

## 2022-04-05 DIAGNOSIS — E039 Hypothyroidism, unspecified: Secondary | ICD-10-CM

## 2022-04-05 DIAGNOSIS — R1013 Epigastric pain: Secondary | ICD-10-CM | POA: Diagnosis not present

## 2022-04-05 DIAGNOSIS — Z860100 Personal history of colon polyps, unspecified: Secondary | ICD-10-CM

## 2022-04-05 MED ORDER — SYNTHROID 175 MCG PO TABS: ORAL_TABLET | ORAL | 2 refills | Status: DC

## 2022-04-05 NOTE — Patient Instructions (Signed)
Take protonix 30 minutes before your evening meal

## 2022-04-05 NOTE — Progress Notes (Signed)
Patient ID: [name] [name], female   DOB: 06-09-1951, 71 y.o.   MRN: [id number]   Subjective:    Patient ID: [name] [name], female    DOB: 07-19-1951, 71 y.o.   MRN: [id number]   Patient here for a scheduled follow up.   Chief Complaint  Patient presents with   Hypertension   Hyperlipidemia   .   HPI Reports that 10 days ago, noticed some burning sensation.  Noticed after drinking one beer.  Has noticed abdominal bloating.  Some nausea.  No vomiting.  Some epigastric discomfort.  Some constipation, but this is better.  Some reflux, but more bloating.  No chest pain with increased activity or exertion.  No cough or congestion.  Is scheduled to have carpal tunnel surgery Friday.     Past Medical History:  Diagnosis Date   Adenomatous polyp 2005   Arrhythmia    H/O   Basal cell carcinoma    CTS (carpal tunnel syndrome)    Environmental allergies    History of chicken pox    HTN (hypertension)    Hypercholesterolemia    Hypothyroidism    Past Surgical History:  Procedure Laterality Date   BASAL CELL CARCINOMA EXCISION     right hip,abd, right arm, back neck   BREAST CYST ASPIRATION Left    negative   COLONOSCOPY WITH PROPOFOL N/A 08/24/2020   Procedure: COLONOSCOPY WITH PROPOFOL;  Surgeon: Lin Landsman, MD;  Location: [facility];  Service: Gastroenterology;  Laterality: N/A;   CTS releast Rt wrist     lipoma removal     Rt shoulder    MELANOMA EXCISION     right buttocks, top of scalp   POLYPECTOMY     + TA   TUBAL LIGATION  1982   Family History  Problem Relation Age of Onset   Cervical cancer Mother    Arthritis Mother    Hypertension Mother    Sudden death Mother        cerbral hemorrage   Heart disease Father    Cancer Father        lung cancer   Diabetes Maternal Grandmother    Breast cancer Paternal Aunt 27   Colon cancer Neg Hx    Social History   Socioeconomic History   Marital status: Widowed    Spouse name: Not on file   Number of  children: 1   Years of education: Not on file   Highest education level: Not on file  Occupational History   Occupation: [employer]/ Ortho     Employer: [employer]  Tobacco Use   Smoking status: Former    Types: Cigarettes    Quit date: 07/16/1980    Years since quitting: 41.7   Smokeless tobacco: Never  Vaping Use   Vaping Use: Never used  Substance and Sexual Activity   Alcohol use: Yes    Alcohol/week: 7.0 standard drinks of alcohol    Types: 7 Glasses of wine per week    Comment: 1 per day    Drug use: No   Sexual activity: Never  Other Topics Concern   Not on file  Social History Narrative   Widowed; 1 son.    Retired Art therapist   Daily caffeine use 2-3/day       Cell # [id number]   Social Determinants of Health   Financial Resource Strain: Low Risk  (02/23/2022)   Overall Financial Resource Strain (CARDIA)    Difficulty of Paying Living Expenses:  Not hard at all  Food Insecurity: No Food Insecurity (02/23/2022)   Hunger Vital Sign    Worried About Running Out of Food in the Last Year: Never true    Ran Out of Food in the Last Year: Never true  Transportation Needs: No Transportation Needs (02/23/2022)   PRAPARE - Hydrologist (Medical): No    Lack of Transportation (Non-Medical): No  Physical Activity: Sufficiently Active (02/23/2022)   Exercise Vital Sign    Days of Exercise per Week: 7 days    Minutes of Exercise per Session: 60 min  Stress: No Stress Concern Present (02/23/2022)   [place]    Feeling of Stress : Not at all  Social Connections: Unknown (02/23/2022)   Social Connection and Isolation Panel [NHANES]    Frequency of Communication with Friends and Family: More than three times a week    Frequency of Social Gatherings with Friends and Family: More than three times a week    Attends Religious Services: Not on Advertising copywriter or  Organizations: Not on file    Attends Archivist Meetings: Not on file    Marital Status: Not on file     Review of Systems  Constitutional:  Negative for fever and unexpected weight change.  HENT:  Negative for congestion.   Respiratory:  Negative for cough, chest tightness and shortness of breath.   Cardiovascular:  Negative for chest pain, palpitations and leg swelling.  Gastrointestinal:  Negative for diarrhea, nausea and vomiting.       Abdominal bloating and discomfort as outlined.  Some constipation - some better now.   Genitourinary:  Negative for difficulty urinating and dysuria.  Musculoskeletal:  Negative for joint swelling and myalgias.  Skin:  Negative for color change and rash.  Neurological:  Negative for dizziness, light-headedness and headaches.  Psychiatric/Behavioral:  Negative for agitation and dysphoric mood.        Objective:     BP 124/70 (BP Location: Left Arm, Patient Position: Sitting, Cuff Size: Small)   Pulse 64   Temp 98 F (36.7 C) (Temporal)   Resp 16   Ht '5\' 2"'$  (1.575 m)   Wt 146 lb 9.6 oz (66.5 kg)   SpO2 98%   BMI 26.81 kg/m  Wt Readings from Last 3 Encounters:  04/05/22 146 lb 9.6 oz (66.5 kg)  02/23/22 147 lb (66.7 kg)  11/29/21 150 lb 9.6 oz (68.3 kg)    Physical Exam Vitals reviewed.  Constitutional:      General: She is not in acute distress.    Appearance: Normal appearance.  HENT:     Head: Normocephalic and atraumatic.     Right Ear: External ear normal.     Left Ear: External ear normal.  Eyes:     General: No scleral icterus.       Right eye: No discharge.        Left eye: No discharge.     Conjunctiva/sclera: Conjunctivae normal.  Neck:     Thyroid: No thyromegaly.  Cardiovascular:     Rate and Rhythm: Normal rate and regular rhythm.  Pulmonary:     Effort: No respiratory distress.     Breath sounds: Normal breath sounds. No wheezing.  Abdominal:     General: Bowel sounds are normal.     Palpations:  Abdomen is soft.     Comments: Some epigastric tenderness.  Musculoskeletal:        General: No swelling or tenderness.     Cervical back: Neck supple. No tenderness.  Lymphadenopathy:     Cervical: No cervical adenopathy.  Skin:    Findings: No erythema or rash.  Neurological:     Mental Status: She is alert.  Psychiatric:        Mood and Affect: Mood normal.        Behavior: Behavior normal.      Outpatient Encounter Medications as of 04/05/2022  Medication Sig   azelastine (ASTELIN) 0.1 % nasal spray USE 1 SPRAY IN BOTH NOSTRILS DAILY AS NEEDED   cholecalciferol (VITAMIN D) 1000 units tablet Take 1,000 Units by mouth daily.   Cyanocobalamin (VITAMIN B-12 PO) Take by mouth.   Eszopiclone 3 MG TABS Take 1/2 - 1 tablet immediately before bedtime   irbesartan (AVAPRO) 75 MG tablet TAKE ONE TABLET BY MOUTH EVERY DAY   metoCLOPramide (REGLAN) 5 MG tablet Take 1 tablet (5 mg total) by mouth 3 (three) times daily before meals.   Multiple Vitamins-Minerals (HAIR SKIN AND NAILS FORMULA PO) Take 1 tablet by mouth daily.   Nutritional Supplements (NUTRITIONAL SUPPLEMENT PO) Take by mouth. DoTERRA: ALPHA CRS+, xEO MEGA, MICROPLEX MVp, ON GUARD (protective blend)   ondansetron (ZOFRAN ODT) 4 MG disintegrating tablet Take 1 tablet (4 mg total) by mouth every 8 (eight) hours as needed for nausea or vomiting.   pantoprazole (PROTONIX) 40 MG tablet TAKE 1 TABLET BY MOUTH DAILY   rosuvastatin (CRESTOR) 10 MG tablet TAKE 1 TABLET BY MOUTH DAILY   VITAMIN D PO Take by mouth.   [DISCONTINUED] SYNTHROID 175 MCG tablet TAKE 1 TABLET EVERY MORNING WITH A GLASSOF WATER 30 TO 60 MINUTES BEFORE BREAKFAST   SYNTHROID 175 MCG tablet Take one tablet every morning with a glass of water 30 to 60 minutes before breakfast   [DISCONTINUED] doxycycline (VIBRA-TABS) 100 MG tablet Take 1 tablet (100 mg total) by mouth 2 (two) times daily.   [DISCONTINUED] levocetirizine (XYZAL) 5 MG tablet TAKE 1 TABLET BY MOUTH  DAILY   [DISCONTINUED] predniSONE (DELTASONE) 10 MG tablet Take 4 tablets x 1 day and then decrease by 1/2 tablet per day until down to zero mg.   [DISCONTINUED] sertraline (ZOLOFT) 50 MG tablet TAKE 1/2 TABLET DAILY FOR 1 WEEK THEN INCREASE TO 1 TABLET DAILY   No facility-administered encounter medications on file as of 04/05/2022.     Lab Results  Component Value Date   WBC 5.9 03/28/2022   HGB 13.2 03/28/2022   HCT 38.1 03/28/2022   PLT 234.0 03/28/2022   GLUCOSE 99 03/28/2022   CHOL 170 03/28/2022   TRIG 79.0 03/28/2022   HDL 72.60 03/28/2022   LDLDIRECT 118.2 10/16/2006   LDLCALC 82 03/28/2022   ALT 18 03/28/2022   AST 16 03/28/2022   NA 137 03/28/2022   K 3.9 03/28/2022   CL 105 03/28/2022   CREATININE 0.63 03/28/2022   BUN 24 (H) 03/28/2022   CO2 25 03/28/2022   TSH 0.49 03/28/2022    MM 3D SCREEN BREAST BILATERAL  Result Date: 12/28/2021 CLINICAL DATA:  Screening. EXAM: DIGITAL SCREENING BILATERAL MAMMOGRAM WITH TOMOSYNTHESIS AND CAD TECHNIQUE: Bilateral screening digital craniocaudal and mediolateral oblique mammograms were obtained. Bilateral screening digital breast tomosynthesis was performed. The images were evaluated with computer-aided detection. COMPARISON:  Previous exam(s). ACR Breast Density Category c: The breast tissue is heterogeneously dense, which may obscure small masses. FINDINGS: There are no findings suspicious for  malignancy. IMPRESSION: No mammographic evidence of malignancy. A result letter of this screening mammogram will be mailed directly to the patient. RECOMMENDATION: Screening mammogram in one year. (Code:SM-B-01Y) BI-RADS CATEGORY  1: Negative. Electronically Signed   By: Valentino Saxon M.D.   On: 12/28/2021 10:15       Assessment & Plan:   Problem List Items Addressed This Visit     Aortic atherosclerosis ([place])    Continue crestor.        Carpal tunnel syndrome    Has seen Dr Peggye Ley.  Planning for carpal tunnel surgery end of this  week. Plans to call and notify them of her GI issues and postpone surgery.        Epigastric pain    Symptoms started approximately 10 days ago.  Describes abdominal bloating, some nausea and epigastric discomfort.  Discussed protonix daily and adding pepcid.  Recent labs revealed liver function to be normal.  Check abdominal ultrasound.  Further w/up pending response to above medication change and ultrasound results.  Follow closely.  Call with update.       Relevant Orders   US Abdomen Complete   Essential hypertension    Blood pressure has been doing well.  Continue avapro.       History of colonic polyps    Colonoscopy 08/24/20 - recommended f/u in 10 years.       Hyperlipidemia     On crestor.  Low cholesterol diet and exercise.  Follow lipid panel and liver function tests.  . Lab Results  Component Value Date   CHOL 170 03/28/2022   HDL 72.60 03/28/2022   LDLCALC 82 03/28/2022   LDLDIRECT 118.2 10/16/2006   TRIG 79.0 03/28/2022   CHOLHDL 2 03/28/2022       Hypothyroidism    On thyroid replacement.  Follow tsh.       Relevant Medications   SYNTHROID 175 MCG tablet   Stress    Has had increased stress with family medical issues.  Overall appears to be handling things well.           Einar Pheasant, MD

## 2022-04-06 IMAGING — CT CT ABD-PELV W/ CM
2 of 5 series · 16 of 46 positions shown, 18 images · IV contrast (APPLIED)
Comparison: 08/24/2006

CLINICAL DATA: Nausea, vomiting

EXAM:
CT ABDOMEN AND PELVIS WITH CONTRAST
TECHNIQUE: Multidetector CT imaging of the abdomen and pelvis was performed
using the standard protocol following bolus administration of
intravenous contrast.
CONTRAST:  100mL OMNIPAQUE IOHEXOL 300 MG/ML  SOLN

[Series 2: routine abd/pel with · axial · 0.69mm/px · z∈[-386,-11]mm · 13 of 85 slices shown, 15 images]
[im 5/85  soft-tissue]
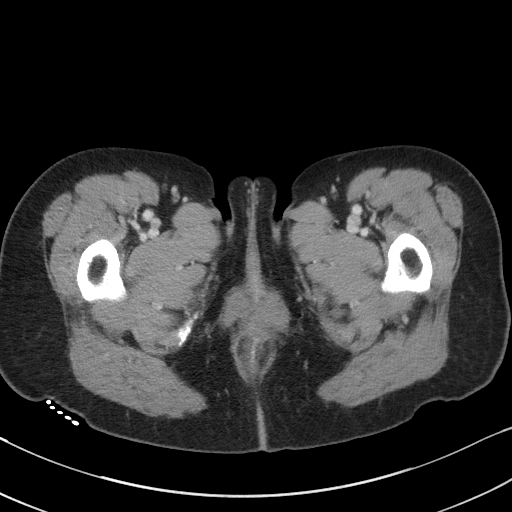
[im 5/85  bone]
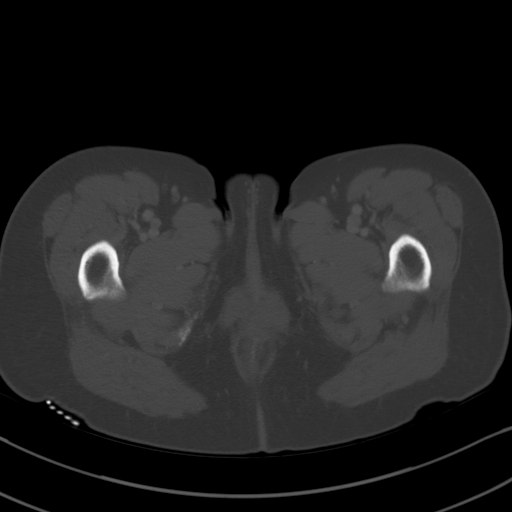
[im 10/85  soft-tissue]
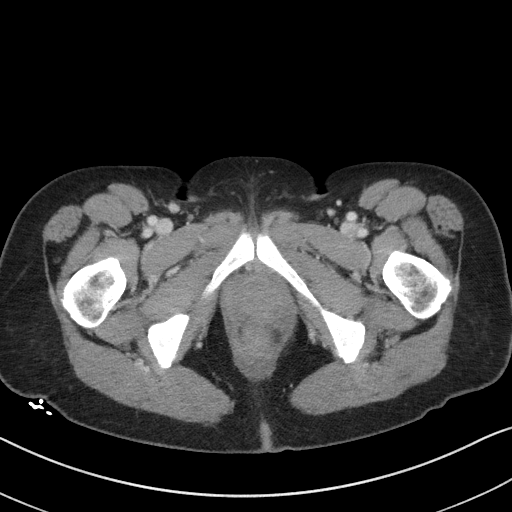
[im 20/85  soft-tissue]
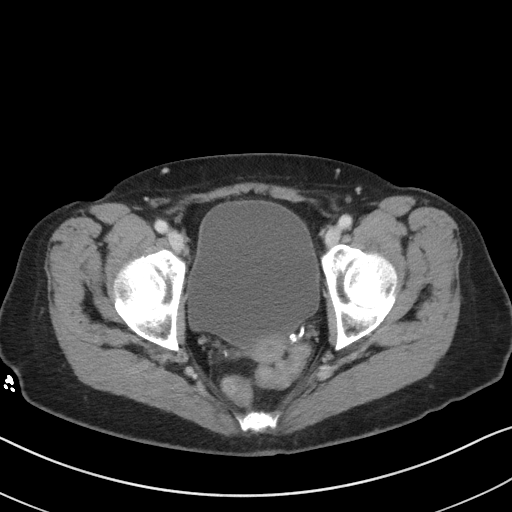
[im 25/85  soft-tissue]
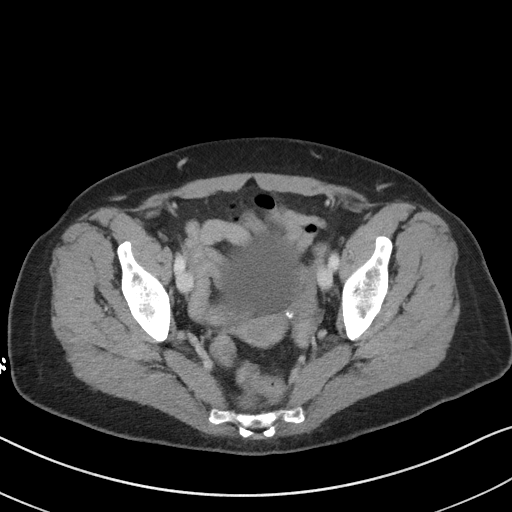
[im 30/85  soft-tissue]
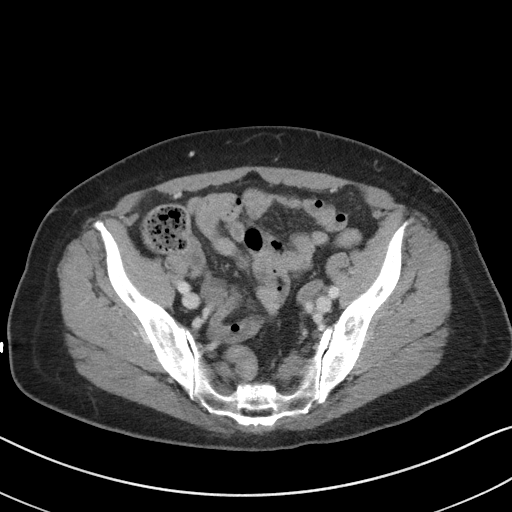
[im 35/85  soft-tissue]
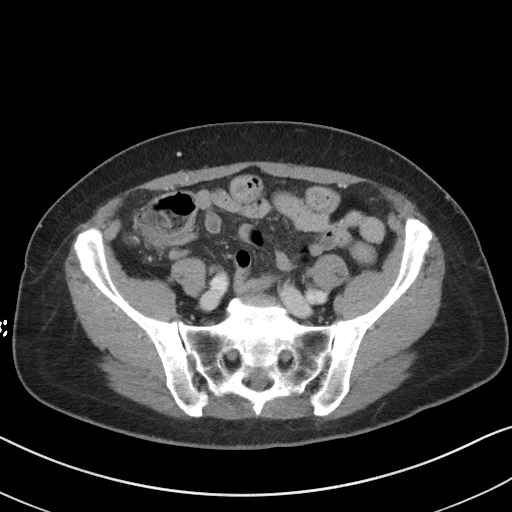
[im 45/85  soft-tissue]
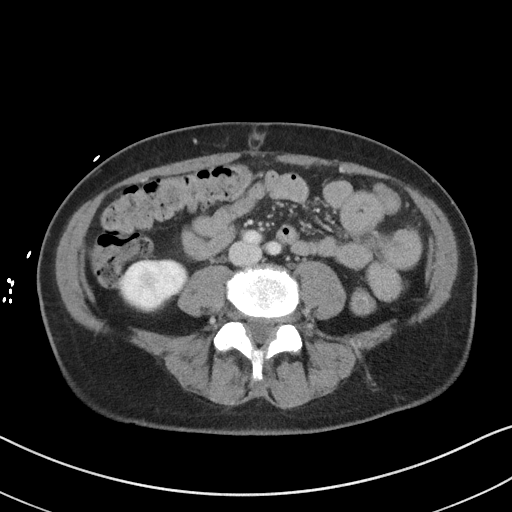
[im 50/85  soft-tissue]
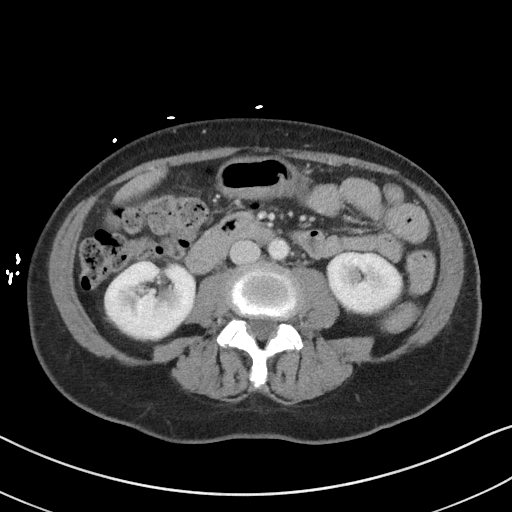
[im 55/85  soft-tissue]
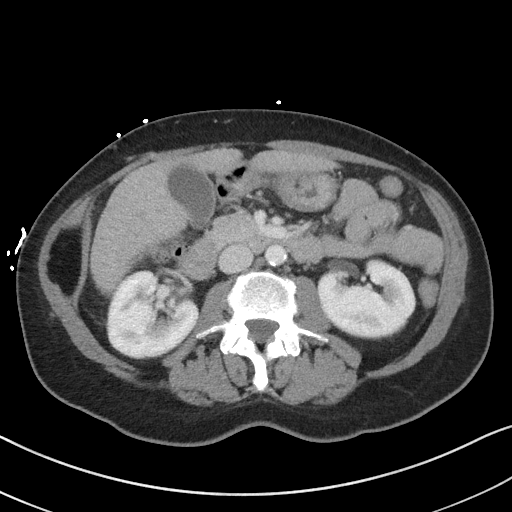
[im 55/85  bone]
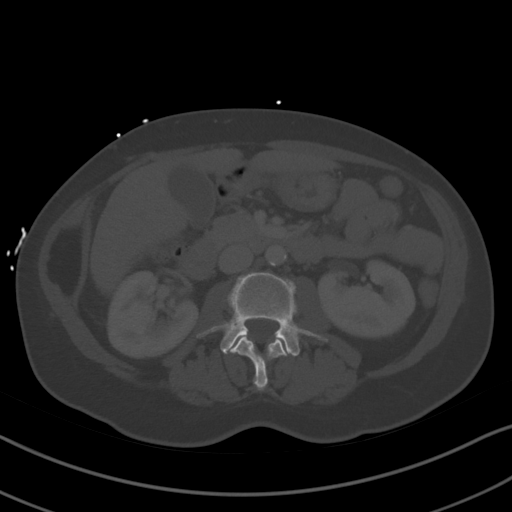
[im 60/85  soft-tissue]
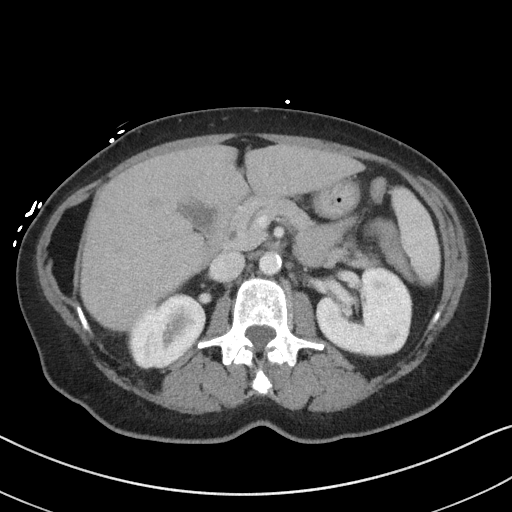
[im 65/85  soft-tissue]
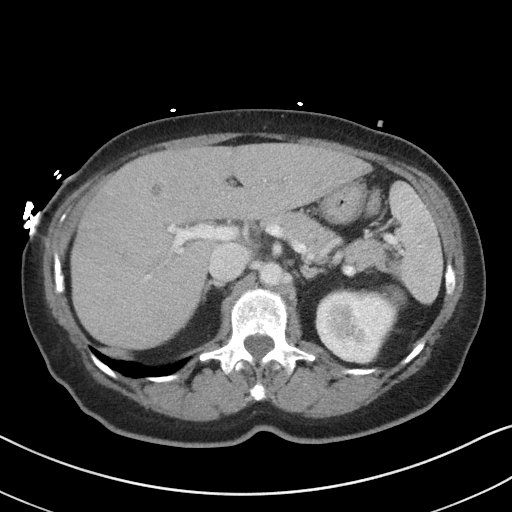
[im 75/85  soft-tissue]
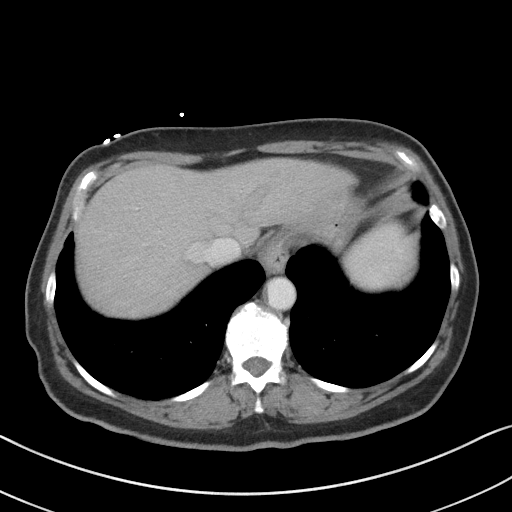
[im 80/85  soft-tissue]
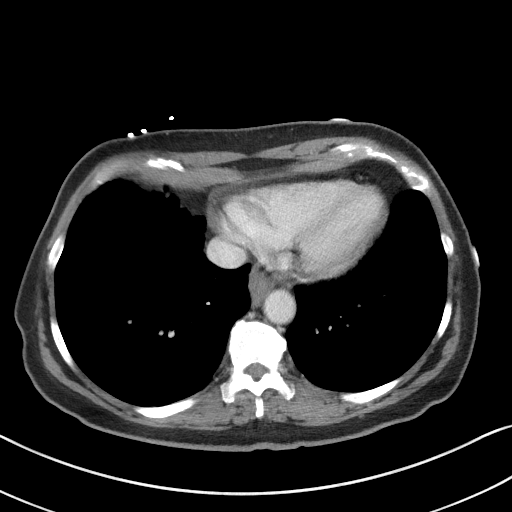

[Series 5: coronal st · coronal · 0.66mm/px · 3 of 82 slices shown]
[im 28/82  soft-tissue]
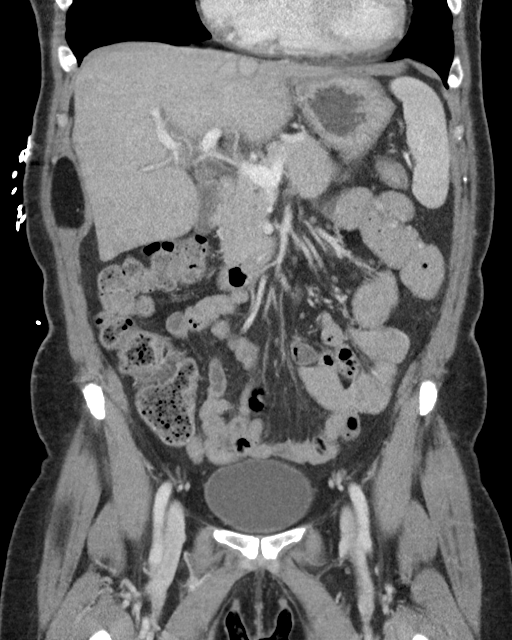
[im 37/82  soft-tissue]
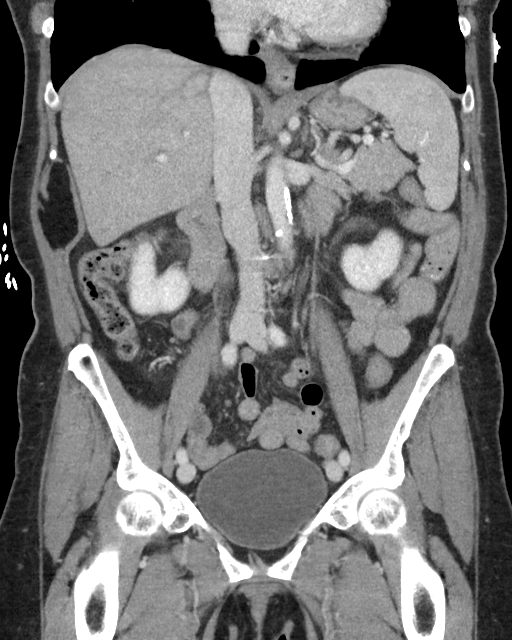
[im 46/82  soft-tissue]
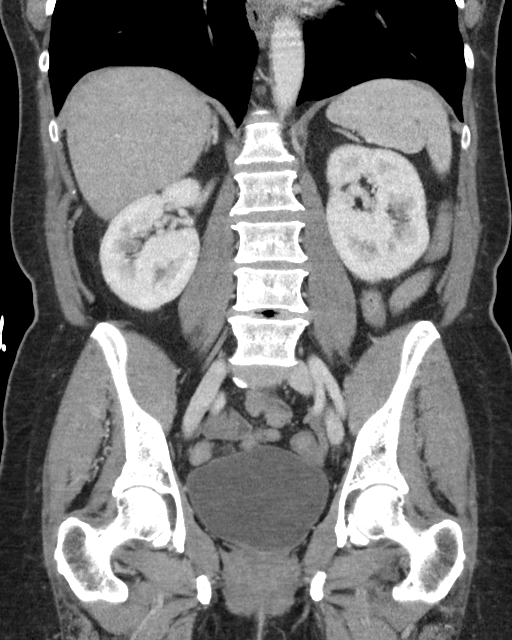

[16 of 46 positions shown; findings below may reference images not displayed]

FINDINGS: Lower chest: The visualized lung bases are clear bilaterally. The
visualized heart and pericardium are unremarkable. Small hiatal
hernia.

Hepatobiliary: Tiny cyst within the right hepatic lobe. Focal fatty
hepatic infiltration adjacent the falciform ligament. Liver
otherwise unremarkable. Gallbladder unremarkable. No intra or
extrahepatic biliary ductal dilation.

Pancreas: Stable 3 mm cyst within the mid body and tail of the
pancreas. The pancreas is otherwise unremarkable.

Spleen: Unremarkable

Adrenals/Urinary Tract: Adrenal glands are unremarkable. Kidneys are
normal, without renal calculi, focal lesion, or hydronephrosis.
Bladder is unremarkable.

Stomach/Bowel: Stomach, small bowel, and large bowel are
unremarkable. Appendix normal. No free intraperitoneal gas or fluid.

Vascular/Lymphatic: Mild atherosclerotic calcification within the
abdominal aorta. No aortic aneurysm. No pathologic adenopathy within
the abdomen and pelvis.

Reproductive: Uterus and bilateral adnexa are unremarkable.

Other: Rectum unremarkable

Musculoskeletal: Degenerative changes are noted within the lumbar
spine. No lytic or blastic bone lesions. 7.2 cm intramuscular lipoma
noted insinuating between the a right internal oblique and
transversus abdominus musculature of the abdominal wall, unchanged.
IMPRESSION: No radiographic explanation for the patient's reported symptoms.
Incidental findings as noted above.

Aortic Atherosclerosis ([id number].H).

## 2022-04-08 ENCOUNTER — Encounter: Payer: Self-pay | Admitting: Internal Medicine

## 2022-04-11 NOTE — Assessment & Plan Note (Signed)
Has had increased stress with family medical issues.  Overall appears to be handling things well.

## 2022-04-11 NOTE — Assessment & Plan Note (Signed)
Symptoms started approximately 10 days ago.  Describes abdominal bloating, some nausea and epigastric discomfort.  Discussed protonix daily and adding pepcid.  Recent labs revealed liver function to be normal.  Check abdominal ultrasound.  Further w/up pending response to above medication change and ultrasound results.  Follow closely.  Call with update.

## 2022-04-11 NOTE — Assessment & Plan Note (Addendum)
On crestor.  Low cholesterol diet and exercise.  Follow lipid panel and liver function tests.  . Lab Results  Component Value Date   CHOL 170 03/28/2022   HDL 72.60 03/28/2022   LDLCALC 82 03/28/2022   LDLDIRECT 118.2 10/16/2006   TRIG 79.0 03/28/2022   CHOLHDL 2 03/28/2022   

## 2022-04-11 NOTE — Assessment & Plan Note (Signed)
Has seen Dr Peggye Ley.  Planning for carpal tunnel surgery end of this week. Plans to call and notify them of her GI issues and postpone surgery.

## 2022-04-11 NOTE — Assessment & Plan Note (Signed)
Continue crestor 

## 2022-04-11 NOTE — Assessment & Plan Note (Signed)
Blood pressure has been doing well.  Continue avapro.  

## 2022-04-11 NOTE — Assessment & Plan Note (Signed)
Colonoscopy 08/24/20 - recommended f/u in 10 years.  

## 2022-04-11 NOTE — Assessment & Plan Note (Signed)
On thyroid replacement.  Follow tsh.  

## 2022-04-13 ENCOUNTER — Ambulatory Visit
Admission: RE | Admit: 2022-04-13 | Discharge: 2022-04-13 | Disposition: A | Discharge status: Home / Self Care | Payer: PPO | Source: Ambulatory Visit | Attending: Internal Medicine | Admitting: Internal Medicine

## 2022-04-13 DIAGNOSIS — R1013 Epigastric pain: Secondary | ICD-10-CM | POA: Diagnosis not present

## 2022-04-13 DIAGNOSIS — R109 Unspecified abdominal pain: Secondary | ICD-10-CM | POA: Diagnosis not present

## 2022-04-19 ENCOUNTER — Other Ambulatory Visit: Payer: Self-pay | Admitting: Internal Medicine

## 2022-04-19 ENCOUNTER — Encounter: Payer: Self-pay | Admitting: Gastroenterology

## 2022-04-19 ENCOUNTER — Ambulatory Visit: Payer: PPO | Admitting: Gastroenterology

## 2022-04-19 ENCOUNTER — Other Ambulatory Visit: Payer: Self-pay

## 2022-04-19 VITALS — BP 143/82 | HR 61 | Temp 97.9°F | Ht 62.0 in | Wt 145.5 lb

## 2022-04-19 DIAGNOSIS — R1310 Dysphagia, unspecified: Secondary | ICD-10-CM | POA: Diagnosis not present

## 2022-04-19 DIAGNOSIS — R1013 Epigastric pain: Secondary | ICD-10-CM

## 2022-04-19 DIAGNOSIS — K219 Gastro-esophageal reflux disease without esophagitis: Secondary | ICD-10-CM | POA: Diagnosis not present

## 2022-04-19 NOTE — Progress Notes (Signed)
Cephas Darby, MD [address]  [place]  [place], [place] [address]  Main: [phone]  Fax: [phone]    Gastroenterology Consultation  Referring Provider:     Einar Pheasant, MD Primary Care Physician:  Einar Pheasant, MD Primary Gastroenterologist:  Dr. Cephas Darby Reason for Consultation: Epigastric pain, nausea, early satiety, GERD        HPI:   [name] [name] is a 71 y.o. female referred by Dr. Einar Pheasant, MD  for consultation & management of heme positive stool.  Patient had history of adenomatous colon polyp, hypertension, hypercholesterolemia, hypothyroidism who underwent fecal occult test as a colon cancer screening on 03/03/2020.  It came back positive.  Therefore, referred to GI to discuss about colonoscopy.  Patient denies having any GI symptoms today.  She was taking ashwagandha to enhance her immune system, noticed that she developed severe constipation, therefore stopped using it.  She is no longer experiencing constipation Labs are unremarkable including CBC, CMP, TSH  Follow-up visit 09/24/20 Patient is here for follow-up of her reflux symptoms.  She reports that, since October she has been experiencing heartburn usually during the day.  She has gained about 5 pounds within last 3 months.  She has been less active.  She is able to go to gym.  She also reports that her weight gain is secondary to increased portion size of her meals.  She started taking omeprazole 20 mg daily which partially relieves her symptoms.  She denies any difficulty swallowing.  She does report epigastric burning pain.  Follow-up visit 04/19/2022 Approximately 1 month history of difficulty swallowing.  She feels burning in her chest radiating to the bilateral shoulders, drinking water makes her chest hurt, feels like a esophageal spasm and sensation of getting stuck and she has to relax.  Her weight has been stable.  She denies any abdominal bloating.  She denies eating or  drinking.  She has been going through significant stress lately because her mother-in-law has new diagnosis of dementia.  She is interested to undergo repeat upper endoscopy.  She is currently on Protonix 40 mg daily and on Pepcid at night.  She lives alone and would like to stay healthy, lives independently  NSAIDs: None  Antiplts/Anticoagulants/Anti thrombotics: None  GI Procedures: Colonoscopy in 07/26/2013 by Dr. Deatra Ina Found to have diverticulosis and external hemorrhoids only Upper endoscopy 07/28/2006 for dysphagia LA grade a esophagitis, dilation to 17 mm  Colonoscopy 08/24/2020 - The examined portion of the ileum was normal. - Non-bleeding external hemorrhoids. - The examination was otherwise normal. - The entire examined colon is normal. - No specimens collected.  She denies family history of GI malignancy She does not smoke, occasional alcohol use    Past Medical History:  Diagnosis Date   Adenomatous polyp 2005   Arrhythmia    H/O   Basal cell carcinoma    CTS (carpal tunnel syndrome)    Environmental allergies    History of chicken pox    HTN (hypertension)    Hypercholesterolemia    Hypothyroidism     Past Surgical History:  Procedure Laterality Date   BASAL CELL CARCINOMA EXCISION     right hip,abd, right arm, back neck   BREAST CYST ASPIRATION Left    negative   COLONOSCOPY WITH PROPOFOL N/A 08/24/2020   Procedure: COLONOSCOPY WITH PROPOFOL;  Surgeon: Lin Landsman, MD;  Location: [facility];  Service: Gastroenterology;  Laterality: N/A;   CTS releast Rt wrist  lipoma removal     Rt shoulder    MELANOMA EXCISION     right buttocks, top of scalp   POLYPECTOMY     + TA   TUBAL LIGATION  1982    Current Outpatient Medications:    azelastine (ASTELIN) 0.1 % nasal spray, USE 1 SPRAY IN BOTH NOSTRILS DAILY AS NEEDED, Disp: 30 mL, Rfl: 0   cholecalciferol (VITAMIN D) 1000 units tablet, Take 1,000 Units by mouth daily., Disp: , Rfl:     Cyanocobalamin (VITAMIN B-12 PO), Take by mouth., Disp: , Rfl:    Eszopiclone 3 MG TABS, Take 1/2 - 1 tablet immediately before bedtime, Disp: 30 tablet, Rfl: 1   Famotidine-Ca Carb-Mag Hydrox (PEPCID COMPLETE PO), , Disp: , Rfl:    irbesartan (AVAPRO) 75 MG tablet, TAKE ONE TABLET BY MOUTH EVERY DAY, Disp: 90 tablet, Rfl: 1   metoCLOPramide (REGLAN) 5 MG tablet, Take 1 tablet (5 mg total) by mouth 3 (three) times daily before meals., Disp: 30 tablet, Rfl: 0   Nutritional Supplements (NUTRITIONAL SUPPLEMENT PO), Take by mouth. DoTERRA: ALPHA CRS+, xEO MEGA, MICROPLEX MVp, ON GUARD (protective blend), Disp: , Rfl:    ondansetron (ZOFRAN ODT) 4 MG disintegrating tablet, Take 1 tablet (4 mg total) by mouth every 8 (eight) hours as needed for nausea or vomiting., Disp: 20 tablet, Rfl: 0   pantoprazole (PROTONIX) 40 MG tablet, TAKE 1 TABLET BY MOUTH DAILY, Disp: 30 tablet, Rfl: 2   rosuvastatin (CRESTOR) 10 MG tablet, TAKE 1 TABLET BY MOUTH DAILY, Disp: 90 tablet, Rfl: 1   SYNTHROID 175 MCG tablet, Take one tablet every morning with a glass of water 30 to 60 minutes before breakfast, Disp: 90 tablet, Rfl: 2   VITAMIN D PO, Take by mouth., Disp: , Rfl:    Family History  Problem Relation Age of Onset   Cervical cancer Mother    Arthritis Mother    Hypertension Mother    Sudden death Mother        cerbral hemorrage   Heart disease Father    Cancer Father        lung cancer   Diabetes Maternal Grandmother    Breast cancer Paternal Aunt 74   Colon cancer Neg Hx      Social History   Tobacco Use   Smoking status: Former    Types: Cigarettes    Quit date: 07/16/1980    Years since quitting: 41.7   Smokeless tobacco: Never  Vaping Use   Vaping Use: Never used  Substance Use Topics   Alcohol use: Yes    Alcohol/week: 7.0 standard drinks of alcohol    Types: 7 Glasses of wine per week    Comment: 1 per day    Drug use: No    Allergies as of 04/19/2022 - Review Complete 04/19/2022   Allergen Reaction Noted   Codeine     Penicillins Hives 06/26/2017    Review of Systems:    All systems reviewed and negative except where noted in HPI.   Physical Exam:  BP (!) 143/82 (BP Location: Left Arm, Patient Position: Sitting, Cuff Size: Normal)   Pulse 61   Temp 97.9 F (36.6 C) (Oral)   Ht '5\' 2"'$  (1.575 m)   Wt 145 lb 8 oz (66 kg)   BMI 26.61 kg/m  No LMP recorded. Patient is postmenopausal.  General:   Alert,  Well-developed, well-nourished, pleasant and cooperative in NAD Head:  Normocephalic and atraumatic. Eyes:  Sclera clear, no  icterus.   Conjunctiva pink. Ears:  Normal auditory acuity. Nose:  No deformity, discharge, or lesions. Mouth:  No deformity or lesions,oropharynx pink & moist. Neck:  Supple; no masses or thyromegaly. Lungs:  Respirations even and unlabored.  Clear throughout to auscultation.   No wheezes, crackles, or rhonchi. No acute distress. Heart:  Regular rate and rhythm; no murmurs, clicks, rubs, or gallops. Abdomen:  Normal bowel sounds. Soft, non-tender and non-distended without masses, hepatosplenomegaly or hernias noted.  No guarding or rebound tenderness.   Rectal: Not performed Msk:  Symmetrical without gross deformities. Good, equal movement & strength bilaterally. Pulses:  Normal pulses noted. Extremities:  No clubbing or edema.  No cyanosis. Neurologic:  Alert and oriented x3;  grossly normal neurologically. Skin:  Intact without significant lesions or rashes. No jaundice. Psych:  Alert and cooperative. Normal mood and affect.  Imaging Studies: No abdominal imaging  Assessment and Plan:   [name] [name] is a 71 y.o. pleasant Caucasian female with history of hypothyroidism, hypertension, hypercholesterolemia, chronic GERD.  Patient had known history of mild erosive esophagitis and dilation in the past.  She now presents with dysphagia, epigastric discomfort, nausea and early satiety.  Complete abdominal ultrasound on 04/13/2022 is  unremarkable.  Patient has mild peripheral eosinophilia  Continue Protonix in the morning and Pepcid at night Recommend EGD with esophageal biopsies, gastric and duodenal biopsies Recommend esophageal manometry if EGD is unremarkable  Follow up based on the EGD findings   Cephas Darby, MD

## 2022-04-22 ENCOUNTER — Encounter: Payer: Self-pay | Admitting: Gastroenterology

## 2022-04-22 ENCOUNTER — Ambulatory Visit: Payer: PPO | Admitting: Anesthesiology

## 2022-04-22 ENCOUNTER — Encounter
Admission: RE | Disposition: A | Discharge status: Home / Self Care | Payer: Self-pay | Source: Home / Self Care | Attending: Gastroenterology

## 2022-04-22 ENCOUNTER — Other Ambulatory Visit: Payer: Self-pay

## 2022-04-22 ENCOUNTER — Ambulatory Visit
Admission: RE | Admit: 2022-04-22 | Discharge: 2022-04-22 | Disposition: A | Discharge status: Home / Self Care | Payer: PPO | Attending: Gastroenterology | Admitting: Gastroenterology

## 2022-04-22 DIAGNOSIS — J449 Chronic obstructive pulmonary disease, unspecified: Secondary | ICD-10-CM | POA: Diagnosis not present

## 2022-04-22 DIAGNOSIS — E039 Hypothyroidism, unspecified: Secondary | ICD-10-CM | POA: Diagnosis not present

## 2022-04-22 DIAGNOSIS — I10 Essential (primary) hypertension: Secondary | ICD-10-CM | POA: Diagnosis not present

## 2022-04-22 DIAGNOSIS — Z85828 Personal history of other malignant neoplasm of skin: Secondary | ICD-10-CM | POA: Diagnosis not present

## 2022-04-22 DIAGNOSIS — E7800 Pure hypercholesterolemia, unspecified: Secondary | ICD-10-CM | POA: Diagnosis not present

## 2022-04-22 DIAGNOSIS — R1310 Dysphagia, unspecified: Secondary | ICD-10-CM | POA: Diagnosis not present

## 2022-04-22 DIAGNOSIS — R110 Nausea: Secondary | ICD-10-CM | POA: Insufficient documentation

## 2022-04-22 DIAGNOSIS — Z86010 Personal history of colonic polyps: Secondary | ICD-10-CM | POA: Diagnosis not present

## 2022-04-22 DIAGNOSIS — R1013 Epigastric pain: Secondary | ICD-10-CM

## 2022-04-22 DIAGNOSIS — K449 Diaphragmatic hernia without obstruction or gangrene: Secondary | ICD-10-CM | POA: Diagnosis not present

## 2022-04-22 DIAGNOSIS — R6881 Early satiety: Secondary | ICD-10-CM | POA: Insufficient documentation

## 2022-04-22 DIAGNOSIS — Z8619 Personal history of other infectious and parasitic diseases: Secondary | ICD-10-CM | POA: Insufficient documentation

## 2022-04-22 DIAGNOSIS — Z87891 Personal history of nicotine dependence: Secondary | ICD-10-CM | POA: Diagnosis not present

## 2022-04-22 DIAGNOSIS — K219 Gastro-esophageal reflux disease without esophagitis: Secondary | ICD-10-CM | POA: Diagnosis not present

## 2022-04-22 DIAGNOSIS — T7840XA Allergy, unspecified, initial encounter: Secondary | ICD-10-CM | POA: Diagnosis not present

## 2022-04-22 HISTORY — PX: ESOPHAGOGASTRODUODENOSCOPY (EGD) WITH PROPOFOL: SHX5813

## 2022-04-22 SURGERY — ESOPHAGOGASTRODUODENOSCOPY (EGD) WITH PROPOFOL
Anesthesia: General

## 2022-04-22 MED ORDER — LIDOCAINE HCL (CARDIAC) PF 100 MG/5ML IV SOSY
PREFILLED_SYRINGE | INTRAVENOUS | Status: DC | PRN
  Administered 2022-04-22: 80 mg via INTRAVENOUS

## 2022-04-22 MED ORDER — PROPOFOL 500 MG/50ML IV EMUL
INTRAVENOUS | Status: DC | PRN
  Administered 2022-04-22: 130 ug/kg/min via INTRAVENOUS

## 2022-04-22 MED ORDER — SODIUM CHLORIDE 0.9 % IV SOLN: INTRAVENOUS | Status: DC

## 2022-04-22 MED ORDER — PROPOFOL 10 MG/ML IV BOLUS
INTRAVENOUS | Status: DC | PRN
  Administered 2022-04-22: 80 mg via INTRAVENOUS

## 2022-04-22 MED ORDER — PROPOFOL 1000 MG/100ML IV EMUL
INTRAVENOUS | Status: AC
  Filled 2022-04-22: qty 100

## 2022-04-22 NOTE — Anesthesia Preprocedure Evaluation (Signed)
Anesthesia Evaluation  Patient identified by MRN, date of birth, ID band Patient awake    Reviewed: Allergy & Precautions, NPO status , Patient's Chart, lab work & pertinent test results  History of Anesthesia Complications Negative for: history of anesthetic complications  Airway Mallampati: III  TM Distance: >3 FB Neck ROM: full    Dental  (+) Chipped, Poor Dentition   Pulmonary neg shortness of breath, COPD, former smoker,    Pulmonary exam normal        Cardiovascular Exercise Tolerance: Good hypertension, (-) angina(-) Past MI Normal cardiovascular exam     Neuro/Psych  Neuromuscular disease negative psych ROS   GI/Hepatic negative GI ROS, Neg liver ROS,   Endo/Other  Hypothyroidism   Renal/GU negative Renal ROS  negative genitourinary   Musculoskeletal   Abdominal   Peds  Hematology negative hematology ROS (+)   Anesthesia Other Findings Past Medical History: 2005: Adenomatous polyp No date: Arrhythmia     Comment:  H/O No date: Basal cell carcinoma No date: CTS (carpal tunnel syndrome) No date: Environmental allergies No date: History of chicken pox No date: HTN (hypertension) No date: Hypercholesterolemia No date: Hypothyroidism  Past Surgical History: No date: BASAL CELL CARCINOMA EXCISION     Comment:  right hip,abd, right arm, back neck No date: BREAST CYST ASPIRATION; Left     Comment:  negative 08/24/2020: COLONOSCOPY WITH PROPOFOL; N/A     Comment:  Procedure: COLONOSCOPY WITH PROPOFOL;  Surgeon: Lin Landsman, MD;  Location: [facility];  Service:               Gastroenterology;  Laterality: N/A; No date: CTS releast Rt wrist No date: lipoma removal     Comment:  Rt shoulder  No date: MELANOMA EXCISION     Comment:  right buttocks, top of scalp No date: POLYPECTOMY     Comment:  + TA 1982: TUBAL LIGATION     Reproductive/Obstetrics negative OB ROS                              Anesthesia Physical Anesthesia Plan  ASA: 3  Anesthesia Plan: General   Post-op Pain Management:    Induction: Intravenous  PONV Risk Score and Plan: Propofol infusion and TIVA  Airway Management Planned: Natural Airway and Nasal Cannula  Additional Equipment:   Intra-op Plan:   Post-operative Plan:   Informed Consent: I have reviewed the patients History and Physical, chart, labs and discussed the procedure including the risks, benefits and alternatives for the proposed anesthesia with the patient or authorized representative who has indicated his/her understanding and acceptance.     Dental Advisory Given  Plan Discussed with: Anesthesiologist, CRNA and Surgeon  Anesthesia Plan Comments: (Patient consented for risks of anesthesia including but not limited to:  - adverse reactions to medications - risk of airway placement if required - damage to eyes, teeth, lips or other oral mucosa - nerve damage due to positioning  - sore throat or hoarseness - Damage to heart, brain, nerves, lungs, other parts of body or loss of life  Patient voiced understanding.)        Anesthesia Quick Evaluation

## 2022-04-22 NOTE — H&P (Signed)
Cephas Darby, MD [address]  [place]  [place], [place] [address]  Main: [phone]  Fax: [phone] Pager: [phone]  Primary Care Physician:  Einar Pheasant, MD Primary Gastroenterologist:  Dr. Cephas Darby  Pre-Procedure History & Physical: HPI:  [name] [name] is a 71 y.o. female is here for an endoscopy.   Past Medical History:  Diagnosis Date   Adenomatous polyp 2005   Arrhythmia    H/O   Basal cell carcinoma    CTS (carpal tunnel syndrome)    Environmental allergies    History of chicken pox    HTN (hypertension)    Hypercholesterolemia    Hypothyroidism     Past Surgical History:  Procedure Laterality Date   BASAL CELL CARCINOMA EXCISION     right hip,abd, right arm, back neck   BREAST CYST ASPIRATION Left    negative   COLONOSCOPY WITH PROPOFOL N/A 08/24/2020   Procedure: COLONOSCOPY WITH PROPOFOL;  Surgeon: Lin Landsman, MD;  Location: [facility];  Service: Gastroenterology;  Laterality: N/A;   CTS releast Rt wrist     lipoma removal     Rt shoulder    MELANOMA EXCISION     right buttocks, top of scalp   POLYPECTOMY     + TA   TUBAL LIGATION  1982    Prior to Admission medications   Medication Sig Start Date End Date Taking? Authorizing Provider  irbesartan (AVAPRO) 75 MG tablet TAKE ONE TABLET BY MOUTH EVERY DAY 12/29/21  Yes Einar Pheasant, MD  metoCLOPramide (REGLAN) 5 MG tablet Take 1 tablet (5 mg total) by mouth 3 (three) times daily before meals. 06/16/20  Yes Lorella Nimrod, MD  pantoprazole (PROTONIX) 40 MG tablet TAKE 1 TABLET BY MOUTH DAILY 02/01/22  Yes Einar Pheasant, MD  SYNTHROID 175 MCG tablet Take one tablet every morning with a glass of water 30 to 60 minutes before breakfast 04/05/22  Yes Einar Pheasant, MD  azelastine (ASTELIN) 0.1 % nasal spray USE 1 SPRAY IN BOTH NOSTRILS DAILY AS NEEDED 12/20/21   Einar Pheasant, MD  cholecalciferol (VITAMIN D) 1000 units tablet Take 1,000 Units by mouth daily.     [provider]  Cyanocobalamin (VITAMIN B-12 PO) Take by mouth.    [provider]  Eszopiclone 3 MG TABS Take 1/2 - 1 tablet immediately before bedtime 02/18/22   Einar Pheasant, MD  Famotidine-Ca Carb-Mag Hydrox (PEPCID COMPLETE PO)  04/08/22   [provider]  Nutritional Supplements (NUTRITIONAL SUPPLEMENT PO) Take by mouth. DoTERRA: ALPHA CRS+, xEO MEGA, MICROPLEX MVp, ON GUARD (protective blend)    [provider]  ondansetron (ZOFRAN ODT) 4 MG disintegrating tablet Take 1 tablet (4 mg total) by mouth every 8 (eight) hours as needed for nausea or vomiting. 06/14/20   Naaman Plummer, MD  rosuvastatin (CRESTOR) 10 MG tablet TAKE 1 TABLET BY MOUTH DAILY 04/19/22   Dutch Quint B, FNP  VITAMIN D PO Take by mouth.    [provider]    Allergies as of 04/19/2022 - Review Complete 04/19/2022  Allergen Reaction Noted   Codeine     Penicillins Hives 06/26/2017    Family History  Problem Relation Age of Onset   Cervical cancer Mother    Arthritis Mother    Hypertension Mother    Sudden death Mother        cerbral hemorrage   Heart disease Father    Cancer Father        lung  cancer   Diabetes Maternal Grandmother    Breast cancer Paternal Aunt 74   Colon cancer Neg Hx     Social History   Socioeconomic History   Marital status: Widowed    Spouse name: Not on file   Number of children: 1   Years of education: Not on file   Highest education level: Not on file  Occupational History   Occupation: [employer]/ [employer]     Employer: [employer]  Tobacco Use   Smoking status: Former    Types: Cigarettes    Quit date: 07/16/1980    Years since quitting: 41.7   Smokeless tobacco: Never  Vaping Use   Vaping Use: Never used  Substance and Sexual Activity   Alcohol use: Yes    Alcohol/week: 7.0 standard drinks of alcohol    Types: 7 Glasses of wine per week    Comment: 1 per day . NONE IN 4 WEEKS   Drug use: No   Sexual  activity: Never  Other Topics Concern   Not on file  Social History Narrative   Widowed; 1 son.    Retired Art therapist   Daily caffeine use 2-3/day       Cell # [id number]   Social Determinants of Health   Financial Resource Strain: Low Risk  (02/23/2022)   Overall Financial Resource Strain (CARDIA)    Difficulty of Paying Living Expenses: Not hard at all  Food Insecurity: No Food Insecurity (02/23/2022)   Hunger Vital Sign    Worried About Running Out of Food in the Last Year: Never true    [place] in the Last Year: Never true  Transportation Needs: No Transportation Needs (02/23/2022)   PRAPARE - Hydrologist (Medical): No    Lack of Transportation (Non-Medical): No  Physical Activity: Sufficiently Active (02/23/2022)   Exercise Vital Sign    Days of Exercise per Week: 7 days    Minutes of Exercise per Session: 60 min  Stress: No Stress Concern Present (02/23/2022)   [place]    Feeling of Stress : Not at all  Social Connections: Unknown (02/23/2022)   Social Connection and Isolation Panel [NHANES]    Frequency of Communication with Friends and Family: More than three times a week    Frequency of Social Gatherings with Friends and Family: More than three times a week    Attends Religious Services: Not on file    Active Member of Clubs or Organizations: Not on file    Attends Archivist Meetings: Not on file    Marital Status: Not on file  Intimate Partner Violence: Not At Risk (02/23/2022)   Humiliation, Afraid, Rape, and Kick questionnaire    Fear of Current or Ex-Partner: No    Emotionally Abused: No    Physically Abused: No    Sexually Abused: No    Review of Systems: See HPI, otherwise negative ROS  Physical Exam: BP (!) 145/66   Pulse (!) 55   Temp (!) 96 F (35.6 C) (Temporal)   Resp 20   SpO2 100%  General:   Alert,  pleasant and cooperative in  NAD Head:  Normocephalic and atraumatic. Neck:  Supple; no masses or thyromegaly. Lungs:  Clear throughout to auscultation.    Heart:  Regular rate and rhythm. Abdomen:  Soft, nontender and nondistended. Normal bowel sounds, without guarding, and without rebound.   Neurologic:  Alert and  oriented x4;  grossly normal neurologically.  Impression/Plan: [name] [name] is here for an endoscopy to be performed for Epigastric pain, nausea, early satiety, GERD  Risks, benefits, limitations, and alternatives regarding  endoscopy have been reviewed with the patient.  Questions have been answered.  All parties agreeable.   Sherri Sear, MD  04/22/2022, 7:47 AM

## 2022-04-22 NOTE — Op Note (Signed)
[facility] Gastroenterology Patient Name: [name] [name] Procedure Date: 04/22/2022 7:03 AM MRN: [id number] Account #: [id number] Date of Birth: [birth year] Admit Type: Outpatient Age: 71 Room: [facility] ENDO ROOM 2 Gender: Female Note Status: Finalized Instrument Name: Upper Endoscope [id number] Procedure:             Upper GI endoscopy Indications:           Dyspepsia, Dysphagia Providers:             Lin Landsman MD, MD Referring MD:          Einar Pheasant, MD (Referring MD) Medicines:             General Anesthesia Complications:         No immediate complications. Estimated blood loss: None. Procedure:             Pre-Anesthesia Assessment:                        - Prior to the procedure, a History and Physical was                         performed, and patient medications and allergies were                         reviewed. The patient is competent. The risks and                         benefits of the procedure and the sedation options and                         risks were discussed with the patient. All questions                         were answered and informed consent was obtained.                         Patient identification and proposed procedure were                         verified by the physician, the nurse, the                         anesthesiologist, the anesthetist and the technician                         in the pre-procedure area in the procedure room in the                         endoscopy suite. Mental Status Examination: alert and                         oriented. Airway Examination: normal oropharyngeal                         airway and neck mobility. Respiratory Examination:                         clear to auscultation. CV Examination: normal.  Prophylactic Antibiotics: The patient does not require                         prophylactic antibiotics. Prior Anticoagulants: The                         patient has  taken no previous anticoagulant or                         antiplatelet agents. ASA Grade Assessment: III - A                         patient with severe systemic disease. After reviewing                         the risks and benefits, the patient was deemed in                         satisfactory condition to undergo the procedure. The                         anesthesia plan was to use general anesthesia.                         Immediately prior to administration of medications,                         the patient was re-assessed for adequacy to receive                         sedatives. The heart rate, respiratory rate, oxygen                         saturations, blood pressure, adequacy of pulmonary                         ventilation, and response to care were monitored                         throughout the procedure. The physical status of the                         patient was re-assessed after the procedure.                        After obtaining informed consent, the endoscope was                         passed under direct vision. Throughout the procedure,                         the patient's blood pressure, pulse, and oxygen                         saturations were monitored continuously. The Endoscope                         was introduced through the mouth, and advanced to the  second part of duodenum. The upper GI endoscopy was                         accomplished without difficulty. The patient tolerated                         the procedure well. Findings:      The duodenal bulb, second portion of the duodenum and examined duodenum       were normal. Biopsies were taken with a cold forceps for histology.      The entire examined stomach was normal. Biopsies were taken with a cold       forceps for histology.      A small hiatal hernia was present.      The gastroesophageal junction and examined esophagus were normal.       Biopsies were taken with  a cold forceps for histology.      The cardia and gastric fundus were normal on retroflexion. Impression:            - Normal duodenal bulb, second portion of the duodenum                         and examined duodenum. Biopsied.                        - Normal stomach. Biopsied.                        - Small hiatal hernia.                        - Normal gastroesophageal junction and esophagus.                         Biopsied. Recommendation:        - Await pathology results.                        - Discharge patient to home (with escort).                        - Resume previous diet today.                        - Continue present medications. Procedure Code(s):     --- Professional ---                        [phone], Esophagogastroduodenoscopy, flexible,                         transoral; with biopsy, single or multiple Diagnosis Code(s):     --- Professional ---                        K44.9, Diaphragmatic hernia without obstruction or                         gangrene                        R10.13, Epigastric pain  R13.10, Dysphagia, unspecified CPT copyright 2019 American Medical Association. All rights reserved. The codes documented in this report are preliminary and upon coder review may  be revised to meet current compliance requirements. Dr. Ulyess Mort Lin Landsman MD, MD 04/22/2022 8:01:51 AM This report has been signed electronically. Number of Addenda: 0 Note Initiated On: 04/22/2022 7:03 AM Estimated Blood Loss:  Estimated blood loss: none.      [facility]

## 2022-04-22 NOTE — Anesthesia Postprocedure Evaluation (Signed)
Anesthesia Post Note  Patient: [name] [name]  Procedure(s) Performed: ESOPHAGOGASTRODUODENOSCOPY (EGD) WITH PROPOFOL  Patient location during evaluation: Endoscopy Anesthesia Type: General Level of consciousness: awake and alert Pain management: pain level controlled Vital Signs Assessment: post-procedure vital signs reviewed and stable Respiratory status: spontaneous breathing, nonlabored ventilation, respiratory function stable and patient connected to nasal cannula oxygen Cardiovascular status: blood pressure returned to baseline and stable Postop Assessment: no apparent nausea or vomiting Anesthetic complications: no   No notable events documented.   Last Vitals:  Vitals:   04/22/22 0820 04/22/22 0823  BP:  (!) 148/64  Pulse: (!) 50 70  Resp: 17 17  Temp:    SpO2: 98% 99%    Last Pain:  Vitals:   04/22/22 0804  TempSrc: Temporal  PainSc:                  [name] Erek Kowal

## 2022-04-22 NOTE — Transfer of Care (Signed)
Immediate Anesthesia Transfer of Care Note  Patient: [name] [name]  Procedure(s) Performed: ESOPHAGOGASTRODUODENOSCOPY (EGD) WITH PROPOFOL  Patient Location: PACU and Endoscopy Unit  Anesthesia Type:General  Level of Consciousness: drowsy  Airway & Oxygen Therapy: Patient Spontanous Breathing  Post-op Assessment: Report given to RN  Post vital signs: stable  Last Vitals:  Vitals Value Taken Time  BP    Temp    Pulse    Resp    SpO2      Last Pain:  Vitals:   04/22/22 0713  TempSrc: Temporal  PainSc: 0-No pain         Complications: No notable events documented.

## 2022-04-25 LAB — SURGICAL PATHOLOGY

## 2022-04-26 ENCOUNTER — Encounter: Payer: Self-pay | Admitting: Gastroenterology

## 2022-04-26 ENCOUNTER — Other Ambulatory Visit: Payer: Self-pay | Admitting: Gastroenterology

## 2022-04-26 DIAGNOSIS — K219 Gastro-esophageal reflux disease without esophagitis: Secondary | ICD-10-CM

## 2022-04-26 DIAGNOSIS — R1013 Epigastric pain: Secondary | ICD-10-CM

## 2022-04-26 DIAGNOSIS — R1310 Dysphagia, unspecified: Secondary | ICD-10-CM

## 2022-04-26 MED ORDER — PANTOPRAZOLE SODIUM 40 MG PO TBEC: 40.0000 mg | DELAYED_RELEASE_TABLET | Freq: Every day | ORAL | 2 refills | Status: DC

## 2022-04-27 DIAGNOSIS — L578 Other skin changes due to chronic exposure to nonionizing radiation: Secondary | ICD-10-CM | POA: Diagnosis not present

## 2022-04-27 DIAGNOSIS — Z859 Personal history of malignant neoplasm, unspecified: Secondary | ICD-10-CM | POA: Diagnosis not present

## 2022-04-27 DIAGNOSIS — L821 Other seborrheic keratosis: Secondary | ICD-10-CM | POA: Diagnosis not present

## 2022-04-27 DIAGNOSIS — Z872 Personal history of diseases of the skin and subcutaneous tissue: Secondary | ICD-10-CM | POA: Diagnosis not present

## 2022-04-27 DIAGNOSIS — Z86018 Personal history of other benign neoplasm: Secondary | ICD-10-CM | POA: Diagnosis not present

## 2022-04-27 DIAGNOSIS — Z85828 Personal history of other malignant neoplasm of skin: Secondary | ICD-10-CM | POA: Diagnosis not present

## 2022-04-27 DIAGNOSIS — Z85820 Personal history of malignant melanoma of skin: Secondary | ICD-10-CM | POA: Diagnosis not present

## 2022-05-02 ENCOUNTER — Telehealth: Payer: Self-pay | Admitting: Gastroenterology

## 2022-05-02 NOTE — Telephone Encounter (Signed)
Received referral from [place] for a esophageal manometry for dysphagia, Dyspepsia and chronic GERD.  Please advise scheduling?

## 2022-05-03 ENCOUNTER — Other Ambulatory Visit: Payer: Self-pay

## 2022-05-03 DIAGNOSIS — K219 Gastro-esophageal reflux disease without esophagitis: Secondary | ICD-10-CM

## 2022-05-03 DIAGNOSIS — R1310 Dysphagia, unspecified: Secondary | ICD-10-CM

## 2022-05-03 DIAGNOSIS — R1013 Epigastric pain: Secondary | ICD-10-CM

## 2022-05-03 NOTE — Telephone Encounter (Signed)
Referral for esophageal manometry Referring provider [facility] Appointment and procedure information to the patient.

## 2022-05-31 ENCOUNTER — Encounter: Payer: Self-pay | Admitting: Internal Medicine

## 2022-05-31 ENCOUNTER — Other Ambulatory Visit: Payer: Self-pay | Admitting: Internal Medicine

## 2022-06-06 ENCOUNTER — Other Ambulatory Visit: Payer: Self-pay | Admitting: Internal Medicine

## 2022-06-07 NOTE — Telephone Encounter (Signed)
Rx ok'd for lunesta #30 with one refill.

## 2022-06-13 ENCOUNTER — Telehealth: Payer: Self-pay

## 2022-06-13 NOTE — Telephone Encounter (Signed)
Patient is calling because Thursday afternoon she ate yogurt covered raisins. She states after that she had a lot of burning sensation in her chest and throat. She did had some in her shoulders. Denies any shortness of breat. She states she did not have any dysphagia at that time but has this off and on. She states the symptoms are better today. She is still taking Pantoprazole before breakfast and Pepcid '40mg'$  before dinner

## 2022-06-15 ENCOUNTER — Telehealth: Payer: Self-pay | Admitting: Gastroenterology

## 2022-06-15 NOTE — Telephone Encounter (Signed)
Pt was returning your call would like a call back

## 2022-06-15 NOTE — Telephone Encounter (Signed)
Called and left a message for call back  

## 2022-06-15 NOTE — Telephone Encounter (Signed)
Patient verbalized understanding of instructions  

## 2022-06-15 NOTE — Telephone Encounter (Signed)
It is expected to have flareup of her symptoms depending on what she eats.  She has a small hiatal hernia which can trigger her heartburn.  During the flareup, she can take Protonix 40 mg twice a day instead of taking Pepcid before dinner  RV

## 2022-06-23 ENCOUNTER — Encounter: Payer: Self-pay | Admitting: Internal Medicine

## 2022-06-23 ENCOUNTER — Other Ambulatory Visit: Payer: Self-pay | Admitting: Internal Medicine

## 2022-06-23 NOTE — Telephone Encounter (Signed)
Will work in Monday

## 2022-06-27 ENCOUNTER — Encounter: Payer: Self-pay | Admitting: Internal Medicine

## 2022-06-27 ENCOUNTER — Ambulatory Visit (INDEPENDENT_AMBULATORY_CARE_PROVIDER_SITE_OTHER): Payer: PPO | Admitting: Internal Medicine

## 2022-06-27 VITALS — BP 130/68 | HR 63 | Temp 97.9°F | Resp 15 | Ht 62.0 in | Wt 144.0 lb

## 2022-06-27 DIAGNOSIS — K219 Gastro-esophageal reflux disease without esophagitis: Secondary | ICD-10-CM

## 2022-06-27 DIAGNOSIS — I10 Essential (primary) hypertension: Secondary | ICD-10-CM | POA: Diagnosis not present

## 2022-06-27 DIAGNOSIS — Z86010 Personal history of colonic polyps: Secondary | ICD-10-CM | POA: Diagnosis not present

## 2022-06-27 DIAGNOSIS — R1310 Dysphagia, unspecified: Secondary | ICD-10-CM | POA: Diagnosis not present

## 2022-06-27 DIAGNOSIS — F439 Reaction to severe stress, unspecified: Secondary | ICD-10-CM | POA: Diagnosis not present

## 2022-06-27 DIAGNOSIS — R1013 Epigastric pain: Secondary | ICD-10-CM

## 2022-06-27 LAB — BASIC METABOLIC PANEL
BUN: 16 mg/dL (ref 6–23)
CO2: 30 mEq/L (ref 19–32)
Calcium: 9.2 mg/dL (ref 8.4–10.5)
Chloride: 103 mEq/L (ref 96–112)
Creatinine, Ser: 0.78 mg/dL (ref 0.40–1.20)
GFR: 75.56 mL/min (ref >60.00)
Glucose, Bld: 88 mg/dL (ref 70–99)
Potassium: 4.8 mEq/L (ref 3.5–5.1)
Sodium: 139 mEq/L (ref 135–145)

## 2022-06-27 LAB — CBC WITH DIFFERENTIAL/PLATELET
Basophils Absolute: 0 10*3/uL (ref 0.0–0.1)
Basophils Relative: 0.6 % (ref 0.0–3.0)
Eosinophils Absolute: 0.4 10*3/uL (ref 0.0–0.7)
Eosinophils Relative: 6.4 % — ABNORMAL HIGH (ref 0.0–5.0)
HCT: 40 % (ref 36.0–46.0)
Hemoglobin: 13.5 g/dL (ref 12.0–15.0)
Lymphocytes Relative: 24.9 % (ref 12.0–46.0)
Lymphs Abs: 1.5 10*3/uL (ref 0.7–4.0)
MCHC: 33.7 g/dL (ref 30.0–36.0)
MCV: 86.8 fl (ref 78.0–100.0)
Monocytes Absolute: 0.5 10*3/uL (ref 0.1–1.0)
Monocytes Relative: 8.3 % (ref 3.0–12.0)
Neutro Abs: 3.6 10*3/uL (ref 1.4–7.7)
Neutrophils Relative %: 59.8 % (ref 43.0–77.0)
Platelets: 250 10*3/uL (ref 150.0–400.0)
RBC: 4.6 Mil/uL (ref 3.87–5.11)
RDW: 13.2 % (ref 11.5–15.5)
WBC: 6 10*3/uL (ref 4.0–10.5)

## 2022-06-27 LAB — HEPATIC FUNCTION PANEL
ALT: 23 U/L (ref 0–35)
AST: 19 U/L (ref 0–37)
Albumin: 4.7 g/dL (ref 3.5–5.2)
Alkaline Phosphatase: 89 U/L (ref 39–117)
Bilirubin, Direct: 0.1 mg/dL (ref 0.0–0.3)
Total Bilirubin: 0.5 mg/dL (ref 0.2–1.2)
Total Protein: 6.5 g/dL (ref 6.0–8.3)

## 2022-06-27 LAB — LIPASE: Lipase: 29 U/L (ref 11.0–59.0)

## 2022-06-27 NOTE — Progress Notes (Unsigned)
Patient ID: [name] [name], female   DOB: 1951-03-20, 71 y.o.   MRN: [id number]   Subjective:    Patient ID: [name] [name], female    DOB: 01/19/1951, 71 y.o.   MRN: [id number]   Patient here for  Chief Complaint  Patient presents with   Gastroesophageal Reflux   .   HPI Work in appt.  Work in for increased acid reflux.  Taking protonix bid now.  Had EGD - hiatal hernia.  Planning esophageal manometry.     Past Medical History:  Diagnosis Date   Adenomatous polyp 2005   Arrhythmia    H/O   Basal cell carcinoma    CTS (carpal tunnel syndrome)    Environmental allergies    History of chicken pox    HTN (hypertension)    Hypercholesterolemia    Hypothyroidism    Past Surgical History:  Procedure Laterality Date   BASAL CELL CARCINOMA EXCISION     right hip,abd, right arm, back neck   BREAST CYST ASPIRATION Left    negative   COLONOSCOPY WITH PROPOFOL N/A 08/24/2020   Procedure: COLONOSCOPY WITH PROPOFOL;  Surgeon: Lin Landsman, MD;  Location: [place];  Service: Gastroenterology;  Laterality: N/A;   CTS releast Rt wrist     ESOPHAGOGASTRODUODENOSCOPY (EGD) WITH PROPOFOL N/A 04/22/2022   Procedure: ESOPHAGOGASTRODUODENOSCOPY (EGD) WITH PROPOFOL;  Surgeon: Lin Landsman, MD;  Location: [place];  Service: Gastroenterology;  Laterality: N/A;   lipoma removal     Rt shoulder    MELANOMA EXCISION     right buttocks, top of scalp   POLYPECTOMY     + TA   TUBAL LIGATION  1982   Family History  Problem Relation Age of Onset   Cervical cancer Mother    Arthritis Mother    Hypertension Mother    Sudden death Mother        cerbral hemorrage   Heart disease Father    Cancer Father        lung cancer   Diabetes Maternal Grandmother    Breast cancer Paternal Aunt 26   Colon cancer Neg Hx    Social History   Socioeconomic History   Marital status: Widowed    Spouse name: Not on file   Number of children: 1   Years of education: Not on file    Highest education level: Not on file  Occupational History   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 NONE IN 4 WEEKS   Drug use: No   Sexual activity: Never  Other Topics Concern   Not on file  Social History Narrative   Widowed; 1 son.    Retired Art therapist   Daily caffeine use 2-3/day       Cell # [id number]   Social Determinants of Health   Financial Resource Strain: Low Risk  (02/23/2022)   Overall Financial Resource Strain (CARDIA)    Difficulty of Paying Living Expenses: Not hard at all  Food Insecurity: No Food Insecurity (02/23/2022)  Hunger Vital Sign    Worried About Running Out of Food in the Last Year: Never true    Ran Out of Food in the Last Year: Never true  Transportation Needs: No Transportation Needs (02/23/2022)   PRAPARE - Hydrologist (Medical): No    Lack of Transportation (Non-Medical): No  Physical Activity: Sufficiently Active (02/23/2022)   Exercise Vital Sign    Days of Exercise per Week: 7 days    Minutes of Exercise per Session: 60 min  Stress: No Stress Concern Present (02/23/2022)   [place]    Feeling of Stress : Not at all  Social Connections: Unknown (02/23/2022)   Social Connection and Isolation Panel [NHANES]    Frequency of Communication with Friends and Family: More than three times a week    Frequency of Social Gatherings with Friends and Family: More than three times a week    Attends Religious Services: Not on Advertising copywriter or Organizations: Not on file     Attends Archivist Meetings: Not on file    Marital Status: Not on file     Review of Systems     Objective:     BP 130/68 (BP Location: Left Arm, Patient Position: Sitting, Cuff Size: Small)   Pulse 63   Temp 97.9 F (36.6 C) (Temporal)   Resp 15   Ht '5\' 2"'$  (1.575 m)   Wt 144 lb (65.3 kg)   SpO2 98%   BMI 26.34 kg/m  Wt Readings from Last 3 Encounters:  06/27/22 144 lb (65.3 kg)  04/19/22 145 lb 8 oz (66 kg)  04/05/22 146 lb 9.6 oz (66.5 kg)    Physical Exam   Outpatient Encounter Medications as of 06/27/2022  Medication Sig   aluminum hydroxide-magnesium carbonate (GAVISCON) 95-358 MG/15ML SUSP Take by mouth as needed.   azelastine (ASTELIN) 0.1 % nasal spray USE 1 SPRAY IN BOTH NOSTRILS DAILY AS NEEDED   cholecalciferol (VITAMIN D) 1000 units tablet Take 1,000 Units by mouth daily.   Cyanocobalamin (VITAMIN B-12 PO) Take by mouth.   Eszopiclone 3 MG TABS TAKE ONE-HALF TABLET BY MOUTH IMMEDIATELY BEFORE BEDTIME   Famotidine-Ca Carb-Mag Hydrox (PEPCID COMPLETE PO)    irbesartan (AVAPRO) 75 MG tablet TAKE ONE TABLET BY MOUTH EVERY DAY   metoCLOPramide (REGLAN) 5 MG tablet Take 1 tablet (5 mg total) by mouth 3 (three) times daily before meals.   Nutritional Supplements (NUTRITIONAL SUPPLEMENT PO) Take by mouth. DoTERRA: ALPHA CRS+, xEO MEGA, MICROPLEX MVp, ON GUARD (protective blend)   ondansetron (ZOFRAN ODT) 4 MG disintegrating tablet Take 1 tablet (4 mg total) by mouth every 8 (eight) hours as needed for nausea or vomiting.   pantoprazole (PROTONIX) 40 MG tablet Take 1 tablet (40 mg total) by mouth daily before breakfast. (Patient taking differently: Take 40 mg by mouth 2 (two) times daily. Before breakfast and before dinner)   rosuvastatin (CRESTOR) 10 MG tablet TAKE 1 TABLET BY MOUTH DAILY   SYNTHROID 175 MCG tablet Take one tablet every morning with a glass of water 30 to 60 minutes before breakfast   VITAMIN D PO Take by mouth.   No facility-administered  encounter medications on file as of 06/27/2022.     Lab Results  Component Value Date   WBC 5.9 03/28/2022   HGB 13.2 03/28/2022   HCT 38.1 03/28/2022   PLT 234.0 03/28/2022   GLUCOSE  99 03/28/2022   CHOL 170 03/28/2022   TRIG 79.0 03/28/2022   HDL 72.60 03/28/2022   LDLDIRECT 118.2 10/16/2006   LDLCALC 82 03/28/2022   ALT 18 03/28/2022   AST 16 03/28/2022   NA 137 03/28/2022   K 3.9 03/28/2022   CL 105 03/28/2022   CREATININE 0.63 03/28/2022   BUN 24 (H) 03/28/2022   CO2 25 03/28/2022   TSH 0.49 03/28/2022       Assessment & Plan:   Problem List Items Addressed This Visit     GERD (gastroesophageal reflux disease) - Primary   Relevant Medications   aluminum hydroxide-magnesium carbonate (GAVISCON) 95-358 MG/15ML SUSP     Einar Pheasant, MD

## 2022-06-28 ENCOUNTER — Encounter: Payer: Self-pay | Admitting: Internal Medicine

## 2022-06-28 DIAGNOSIS — R109 Unspecified abdominal pain: Secondary | ICD-10-CM | POA: Insufficient documentation

## 2022-06-28 MED ORDER — SUCRALFATE 1 G PO TABS: 1.0000 g | ORAL_TABLET | Freq: Two times a day (BID) | ORAL | 1 refills | Status: DC

## 2022-06-28 NOTE — Assessment & Plan Note (Signed)
Has had increased stress with family medical issues.  Discussed.  Feels she is handling things well.   Notify me if feels needs any further intervention.

## 2022-06-28 NOTE — Assessment & Plan Note (Signed)
Reflux as outlined.  Increased symptoms recently.  S/p EGD as outlined.  Small hiatal hernia.  On protonix.  Now taking bid.  Will add carafate bid.  Planning for esophageal manometry.  Avoid foods that aggravate.  Follow.

## 2022-06-28 NOTE — Assessment & Plan Note (Signed)
Blood pressure has been doing well.  Continue avapro.

## 2022-06-28 NOTE — Assessment & Plan Note (Signed)
Symptoms as outlined.  S/p EGD.  Small hiatal hernia.  Planning esophageal manometry.  Continue small bites and chewing food well.

## 2022-06-28 NOTE — Assessment & Plan Note (Signed)
Abdominal pain with palpation as outlined.  Persistent/worsening symptoms as outlined.  Nausea.  Has had abdominal ultrasound and EGD.  Unrevealing.  Plan for CT abdomen and pelvis to further evaluate.

## 2022-06-28 NOTE — Assessment & Plan Note (Signed)
Colonoscopy 08/24/20 - recommended f/u in 10 years.

## 2022-07-01 ENCOUNTER — Encounter: Payer: Self-pay | Admitting: Internal Medicine

## 2022-07-13 ENCOUNTER — Encounter: Payer: Self-pay | Admitting: Internal Medicine

## 2022-07-13 ENCOUNTER — Ambulatory Visit
Admission: RE | Admit: 2022-07-13 | Discharge: 2022-07-13 | Disposition: A | Discharge status: Home / Self Care | Payer: PPO | Source: Ambulatory Visit | Attending: Internal Medicine | Admitting: Internal Medicine

## 2022-07-13 ENCOUNTER — Ambulatory Visit (INDEPENDENT_AMBULATORY_CARE_PROVIDER_SITE_OTHER): Payer: PPO | Admitting: Internal Medicine

## 2022-07-13 VITALS — BP 138/86 | HR 66 | Temp 98.1°F | Resp 17 | Ht 62.0 in | Wt 144.0 lb

## 2022-07-13 DIAGNOSIS — R109 Unspecified abdominal pain: Secondary | ICD-10-CM | POA: Diagnosis not present

## 2022-07-13 DIAGNOSIS — E785 Hyperlipidemia, unspecified: Secondary | ICD-10-CM

## 2022-07-13 DIAGNOSIS — I700 Atherosclerosis of aorta: Secondary | ICD-10-CM

## 2022-07-13 DIAGNOSIS — F439 Reaction to severe stress, unspecified: Secondary | ICD-10-CM | POA: Diagnosis not present

## 2022-07-13 DIAGNOSIS — K219 Gastro-esophageal reflux disease without esophagitis: Secondary | ICD-10-CM | POA: Diagnosis not present

## 2022-07-13 DIAGNOSIS — I10 Essential (primary) hypertension: Secondary | ICD-10-CM

## 2022-07-13 DIAGNOSIS — Z86010 Personal history of colonic polyps: Secondary | ICD-10-CM

## 2022-07-13 DIAGNOSIS — K760 Fatty (change of) liver, not elsewhere classified: Secondary | ICD-10-CM | POA: Diagnosis not present

## 2022-07-13 DIAGNOSIS — E039 Hypothyroidism, unspecified: Secondary | ICD-10-CM

## 2022-07-13 DIAGNOSIS — R1013 Epigastric pain: Secondary | ICD-10-CM

## 2022-07-13 MED ORDER — OMEPRAZOLE 20 MG PO CPDR: 20.0000 mg | DELAYED_RELEASE_CAPSULE | Freq: Two times a day (BID) | ORAL | 3 refills | Status: DC

## 2022-07-13 MED ORDER — SUCRALFATE 1 GM/10ML PO SUSP: 1.0000 g | Freq: Three times a day (TID) | ORAL | 0 refills | Status: DC

## 2022-07-13 MED ORDER — IOHEXOL 300 MG/ML  SOLN
100.0000 mL | Freq: Once | INTRAMUSCULAR | Status: AC | PRN
Start: 2022-07-13 — End: 2022-07-13
  Administered 2022-07-13: 100 mL via INTRAVENOUS

## 2022-07-13 NOTE — Progress Notes (Signed)
Patient ID: [name] [name], female   DOB: 1951-06-17, 71 y.o.   MRN: [id number]   Subjective:    Patient ID: [name] [name], female    DOB: 1951/01/06, 71 y.o.   MRN: [id number]   Patient here for  Chief Complaint  Patient presents with   Gastroesophageal Reflux   .   HPI Here to follow up regarding her blood pressure and cholesterol.  Recently evaluated - work in appt - abdominal pain/reflux.  Carafate added.  Planned f/u with GI.  Planning for CT today.  Feels carafate has helped.  Still with some issues - sometimes worse after eating.  Increased stress.  Mother-n-law passed.  Has good support.  Does not feel needs any further intervention at this time.  No chest pain.  Breathing stable.  No increased cough or congestion.  Bowels moving.    Past Medical History:  Diagnosis Date   Adenomatous polyp 2005   Arrhythmia    H/O   Basal cell carcinoma    CTS (carpal tunnel syndrome)    Environmental allergies    History of chicken pox    HTN (hypertension)    Hypercholesterolemia    Hypothyroidism    Past Surgical History:  Procedure Laterality Date   BASAL CELL CARCINOMA EXCISION     right hip,abd, right arm, back neck   BREAST CYST ASPIRATION Left    negative   COLONOSCOPY WITH PROPOFOL N/A 08/24/2020   Procedure: COLONOSCOPY WITH PROPOFOL;  Surgeon: Lin Landsman, MD;  Location: [place];  Service: Gastroenterology;  Laterality: N/A;   CTS releast Rt wrist     ESOPHAGOGASTRODUODENOSCOPY (EGD) WITH PROPOFOL N/A 04/22/2022   Procedure: ESOPHAGOGASTRODUODENOSCOPY (EGD) WITH PROPOFOL;  Surgeon: Lin Landsman, MD;  Location: [place] [facility];  Service: Gastroenterology;  Laterality: N/A;   lipoma removal     Rt shoulder    MELANOMA EXCISION     right buttocks, top of scalp   POLYPECTOMY     + TA   TUBAL LIGATION  1982   Family History  Problem Relation Age of Onset   Cervical cancer Mother    Arthritis Mother    Hypertension Mother    Sudden death Mother         cerbral hemorrage   Heart disease Father    Cancer Father        lung cancer   Diabetes Maternal Grandmother    Breast cancer Paternal Aunt 24   Colon cancer Neg Hx    Social History   Socioeconomic History   Marital status: Widowed    Spouse name: Not on file   Number of children: 1   Years of education: Not on file   Highest education level: Not on file  Occupational History   Occupation: ScheduliOfficeng/ Ortho     Employer: [employer]  Tobacco Use   Smoking status: Former    Types: Cigarettes    Quit date: 07/16/1980    Years since quitting: 42.0   Smokeless tobacco: Never  Vaping Use   Vaping Use: Never used  Substance and Sexual Activity   Alcohol use: Yes    Alcohol/week: 7.0 standard drinks of alcohol    Types: 7 Glasses of wine per week    Comment: 1 per day . NONE IN 4 WEEKS   Drug use: No   Sexual activity: Never  Other Topics Concern   Not on file  Social History Narrative   Widowed; 1 son.    Retired Facilities manager  assistant   Daily caffeine use 2-3/day       Cell # [id number]   Social Determinants of Health   Financial Resource Strain: Low Risk  (02/23/2022)   Overall Financial Resource Strain (CARDIA)    Difficulty of Paying Living Expenses: Not hard at all  Food Insecurity: No Food Insecurity (02/23/2022)   Hunger Vital Sign    Worried About Running Out of Food in the Last Year: Never true    [place] in the Last Year: Never true  Transportation Needs: No Transportation Needs (02/23/2022)   PRAPARE - Hydrologist (Medical): No    Lack of Transportation (Non-Medical): No  Physical Activity: Sufficiently Active (02/23/2022)   Exercise Vital Sign    Days of Exercise per Week: 7 days    Minutes of Exercise per Session: 60 min  Stress: No Stress Concern Present (02/23/2022)   Roy    Feeling of Stress : Not at all  Social Connections: Unknown  (02/23/2022)   Social Connection and Isolation Panel [NHANES]    Frequency of Communication with Friends and Family: More than three times a week    Frequency of Social Gatherings with Friends and Family: More than three times a week    Attends Religious Services: Not on Advertising copywriter or Organizations: Not on file    Attends Archivist Meetings: Not on file    Marital Status: Not on file     Review of Systems  Constitutional:  Negative for appetite change and unexpected weight change.  HENT:  Negative for congestion and sinus pressure.   Respiratory:  Negative for cough, chest tightness and shortness of breath.   Cardiovascular:  Negative for chest pain, palpitations and leg swelling.  Gastrointestinal:  Negative for vomiting.       Acid reflux and discomfort as outlined.   Genitourinary:  Negative for difficulty urinating and dysuria.  Musculoskeletal:  Negative for joint swelling and myalgias.  Skin:  Negative for color change and rash.  Neurological:  Negative for dizziness and headaches.  Psychiatric/Behavioral:  Negative for agitation and dysphoric mood.        Objective:     BP 138/86   Pulse 66   Temp 98.1 F (36.7 C) (Temporal)   Resp 17   Ht '5\' 2"'$  (1.575 m)   Wt 144 lb (65.3 kg)   SpO2 98%   BMI 26.34 kg/m  Wt Readings from Last 3 Encounters:  07/13/22 144 lb (65.3 kg)  06/27/22 144 lb (65.3 kg)  04/19/22 145 lb 8 oz (66 kg)    Physical Exam Vitals reviewed.  Constitutional:      General: She is not in acute distress.    Appearance: Normal appearance.  HENT:     Head: Normocephalic and atraumatic.     Right Ear: External ear normal.     Left Ear: External ear normal.  Eyes:     General: No scleral icterus.       Right eye: No discharge.        Left eye: No discharge.     Conjunctiva/sclera: Conjunctivae normal.  Neck:     Thyroid: No thyromegaly.  Cardiovascular:     Rate and Rhythm: Normal rate and regular rhythm.   Pulmonary:     Effort: No respiratory distress.     Breath sounds: Normal breath sounds. No wheezing.  Abdominal:  General: Bowel sounds are normal.     Palpations: Abdomen is soft.     Tenderness: There is no abdominal tenderness.  Musculoskeletal:        General: No swelling or tenderness.     Cervical back: Neck supple. No tenderness.  Lymphadenopathy:     Cervical: No cervical adenopathy.  Skin:    Findings: No erythema or rash.  Neurological:     Mental Status: She is alert.  Psychiatric:        Mood and Affect: Mood normal.        Behavior: Behavior normal.      Outpatient Encounter Medications as of 07/13/2022  Medication Sig   aluminum hydroxide-magnesium carbonate (GAVISCON) 95-358 MG/15ML SUSP Take by mouth as needed.   azelastine (ASTELIN) 0.1 % nasal spray USE 1 SPRAY IN BOTH NOSTRILS DAILY AS NEEDED   cholecalciferol (VITAMIN D) 1000 units tablet Take 1,000 Units by mouth daily.   Cyanocobalamin (VITAMIN B-12 PO) Take by mouth.   Eszopiclone 3 MG TABS TAKE ONE-HALF TABLET BY MOUTH IMMEDIATELY BEFORE BEDTIME   Famotidine-Ca Carb-Mag Hydrox (PEPCID COMPLETE PO)    irbesartan (AVAPRO) 75 MG tablet TAKE ONE TABLET BY MOUTH EVERY DAY   metoCLOPramide (REGLAN) 5 MG tablet Take 1 tablet (5 mg total) by mouth 3 (three) times daily before meals.   Nutritional Supplements (NUTRITIONAL SUPPLEMENT PO) Take by mouth. DoTERRA: ALPHA CRS+, xEO MEGA, MICROPLEX MVp, ON GUARD (protective blend)   omeprazole (PRILOSEC) 20 MG capsule Take 1 capsule (20 mg total) by mouth 2 (two) times daily before a meal.   ondansetron (ZOFRAN ODT) 4 MG disintegrating tablet Take 1 tablet (4 mg total) by mouth every 8 (eight) hours as needed for nausea or vomiting.   rosuvastatin (CRESTOR) 10 MG tablet TAKE 1 TABLET BY MOUTH DAILY   sucralfate (CARAFATE) 1 g tablet Take 1 tablet (1 g total) by mouth 2 (two) times daily.   sucralfate (CARAFATE) 1 GM/10ML suspension Take 10 mLs (1 g total) by mouth  4 (four) times daily -  with meals and at bedtime.   SYNTHROID 175 MCG tablet Take one tablet every morning with a glass of water 30 to 60 minutes before breakfast   VITAMIN D PO Take by mouth.   [DISCONTINUED] pantoprazole (PROTONIX) 40 MG tablet Take 1 tablet (40 mg total) by mouth daily before breakfast. (Patient taking differently: Take 40 mg by mouth 2 (two) times daily. Before breakfast and before dinner)   No facility-administered encounter medications on file as of 07/13/2022.     Lab Results  Component Value Date   WBC 6.0 06/27/2022   HGB 13.5 06/27/2022   HCT 40.0 06/27/2022   PLT 250.0 06/27/2022   GLUCOSE 88 06/27/2022   CHOL 170 03/28/2022   TRIG 79.0 03/28/2022   HDL 72.60 03/28/2022   LDLDIRECT 118.2 10/16/2006   LDLCALC 82 03/28/2022   ALT 23 06/27/2022   AST 19 06/27/2022   NA 139 06/27/2022   K 4.8 06/27/2022   CL 103 06/27/2022   CREATININE 0.78 06/27/2022   BUN 16 06/27/2022   CO2 30 06/27/2022   TSH 0.49 03/28/2022       Assessment & Plan:   Problem List Items Addressed This Visit     Abdominal pain - Primary    GI issues as outlined.  Carafate has helped.  Refill.  Change protonix to omeprazole as directed.  Has CT scheduled today.  Discussed possible need for f/u with GI pending above.  Aortic atherosclerosis (HCC)    Continue crestor.        Essential hypertension    Blood pressure has been doing well.  Elevated today.  Feel may be related to increased stress, etc.  Have her spot check her pressure.  Continue avapro. Send in readings.  Schedule f/u.       Relevant Orders   Basic metabolic panel   GERD (gastroesophageal reflux disease)    Change protonix to prilosec.  Continue carafate.  Discussed possible need for f/u with Dr Marius Ditch.       Relevant Medications   sucralfate (CARAFATE) 1 GM/10ML suspension   omeprazole (PRILOSEC) 20 MG capsule   History of colonic polyps    Colonoscopy 08/24/20 - recommended f/u in 10 years.        Hyperlipidemia     On crestor.  Low cholesterol diet and exercise.  Follow lipid panel and liver function tests.  . Lab Results  Component Value Date   CHOL 170 03/28/2022   HDL 72.60 03/28/2022   LDLCALC 82 03/28/2022   LDLDIRECT 118.2 10/16/2006   TRIG 79.0 03/28/2022   CHOLHDL 2 03/28/2022       Relevant Orders   Hepatic function panel   Lipid panel   Hypothyroidism    On thyroid replacement.  Follow tsh.       Stress    Has had increased stress with family medical issues.  Discussed.  Feels she is handling things well.   Notify me if feels needs any further intervention. Has good support.         Einar Pheasant, MD

## 2022-07-15 ENCOUNTER — Telehealth: Payer: Self-pay | Admitting: Internal Medicine

## 2022-07-15 NOTE — Telephone Encounter (Signed)
I called and LVM for patient to call back for results.  [name] [name],cma

## 2022-07-15 NOTE — Telephone Encounter (Signed)
Ok.  Thank her for the update.  I am glad she is doing better.  Continue omeprazole.  Also see result note regarding her CT scan. Please notify her of results.

## 2022-07-15 NOTE — Telephone Encounter (Signed)
Patient wanted to let Dr Nicki Reaper know that the omeprazole (PRILOSEC) 20 MG capsule that she prescribed is working well.

## 2022-07-18 ENCOUNTER — Telehealth: Payer: Self-pay

## 2022-07-18 ENCOUNTER — Encounter: Payer: Self-pay | Admitting: Internal Medicine

## 2022-07-18 NOTE — Assessment & Plan Note (Signed)
Continue crestor 

## 2022-07-18 NOTE — Assessment & Plan Note (Signed)
On thyroid replacement.  Follow tsh.  

## 2022-07-18 NOTE — Assessment & Plan Note (Signed)
On crestor.  Low cholesterol diet and exercise.  Follow lipid panel and liver function tests.  . Lab Results  Component Value Date   CHOL 170 03/28/2022   HDL 72.60 03/28/2022   LDLCALC 82 03/28/2022   LDLDIRECT 118.2 10/16/2006   TRIG 79.0 03/28/2022   CHOLHDL 2 03/28/2022

## 2022-07-18 NOTE — Assessment & Plan Note (Signed)
GI issues as outlined.  Carafate has helped.  Refill.  Change protonix to omeprazole as directed.  Has CT scheduled today.  Discussed possible need for f/u with GI pending above.

## 2022-07-18 NOTE — Assessment & Plan Note (Signed)
Colonoscopy 08/24/20 - recommended f/u in 10 years.

## 2022-07-18 NOTE — Assessment & Plan Note (Signed)
Change protonix to prilosec.  Continue carafate.  Discussed possible need for f/u with Dr Marius Ditch.

## 2022-07-18 NOTE — Assessment & Plan Note (Signed)
Has had increased stress with family medical issues.  Discussed.  Feels she is handling things well.   Notify me if feels needs any further intervention. Has good support.

## 2022-07-18 NOTE — Assessment & Plan Note (Signed)
Blood pressure has been doing well.  Elevated today.  Feel may be related to increased stress, etc.  Have her spot check her pressure.  Continue avapro. Send in readings.  Schedule f/u.

## 2022-07-18 NOTE — Telephone Encounter (Signed)
Dr. Nicki Reaper office is calling because they just did a CT scan on patient and it reveled a hernia. Patient is having a lot of epigastric pain and they are wanting to know if we can see patient schedule patient for 07/19/2022 at 1:00pm

## 2022-07-19 ENCOUNTER — Ambulatory Visit: Payer: PPO | Admitting: Gastroenterology

## 2022-07-19 ENCOUNTER — Encounter: Payer: Self-pay | Admitting: Gastroenterology

## 2022-07-19 VITALS — BP 162/91 | HR 56 | Temp 98.8°F | Ht 62.0 in | Wt 145.4 lb

## 2022-07-19 DIAGNOSIS — K219 Gastro-esophageal reflux disease without esophagitis: Secondary | ICD-10-CM | POA: Diagnosis not present

## 2022-07-19 DIAGNOSIS — I10 Essential (primary) hypertension: Secondary | ICD-10-CM | POA: Diagnosis not present

## 2022-07-19 MED ORDER — OMEPRAZOLE 40 MG PO CPDR: 40.0000 mg | DELAYED_RELEASE_CAPSULE | Freq: Two times a day (BID) | ORAL | 2 refills | Status: DC

## 2022-07-19 NOTE — Patient Instructions (Addendum)
During today's office visit your blood pressure was high.  Please contact your PCP to follow up.   Follow up as needed.

## 2022-07-19 NOTE — Progress Notes (Addendum)
Cephas Darby, MD [address]  [place]  [place], [place] [address]  Main: [phone]  Fax: [phone]    Gastroenterology Consultation  Referring Provider:     Einar Pheasant, MD Primary Care Physician:  Einar Pheasant, MD Primary Gastroenterologist:  Dr. Cephas Darby Reason for Consultation: Chronic GERD, follow-up of ventral hernia        HPI:   [name] [name] is a 71 y.o. female referred by Dr. Einar Pheasant, MD  for consultation & management of recent exacerbation of her reflux symptoms including epigastric burning, heartburn radiating to bilateral chest.  She was seen by Dr. Nicki Reaper on 06/27/2022.  She tried Carafate in addition to Protonix.  Underwent CT abdomen and pelvis which revealed stable right lateral abdominal wall hernia.  On 07/14/2022, Protonix was switched to omeprazole 20 mg twice daily.  She is taking Carafate as needed.  She reports her symptoms are significantly improved.  Denies any difficulty swallowing.  Her weight has been stable.  She was going through stress about 2 weeks ago after her mother-in-law passed away and she moved her father-in-law to a nursing home.  Now she is trying to concentrate on her health.  NSAIDs: None  Antiplts/Anticoagulants/Anti thrombotics: None  GI Procedures: Colonoscopy in 07/26/2013 by Dr. Deatra Ina Found to have diverticulosis and external hemorrhoids only Upper endoscopy 07/28/2006 for dysphagia LA grade a esophagitis, dilation to 17 mm  Colonoscopy 08/24/2020 - The examined portion of the ileum was normal. - Non-bleeding external hemorrhoids. - The examination was otherwise normal. - The entire examined colon is normal. - No specimens collected.  Upper endoscopy 04/22/2022 - Normal duodenal bulb, second portion of the duodenum and examined duodenum. Biopsied. - Normal stomach. Biopsied. - Small hiatal hernia. - Normal gastroesophageal junction and esophagus. Biopsied. DIAGNOSIS:  A. DUODENUM; COLD  BIOPSY:  - ENTERIC MUCOSA WITH PRESERVED VILLOUS ARCHITECTURE AND NO SIGNIFICANT  HISTOPATHOLOGIC CHANGE.  - NEGATIVE FOR FEATURES OF CELIAC, DYSPLASIA, AND MALIGNANCY.   B. STOMACH, RANDOM; COLD BIOPSY  - GASTRIC ANTRAL AND OXYNTIC MUCOSA WITH NO SIGNIFICANT HISTOPATHOLOGIC  CHANGE.  - NEGATIVE FOR H. PYLORI, DYSPLASIA, AND MALIGNANCY.   C. ESOPHAGUS; COLD BIOPSY:  - BENIGN SQUAMOUS MUCOSA WITH NO SIGNIFICANT HISTOPATHOLOGIC CHANGE.  - NO INCREASE IN INTRAEPITHELIAL EOSINOPHILS (LESS THAN 2 PER HPF).  - NEGATIVE FOR DYSPLASIA AND MALIGNANCY.   She denies family history of GI malignancy She does not smoke, occasional alcohol use    Past Medical History:  Diagnosis Date   Adenomatous polyp 2005   Arrhythmia    H/O   Basal cell carcinoma    CTS (carpal tunnel syndrome)    Environmental allergies    History of chicken pox    HTN (hypertension)    Hypercholesterolemia    Hypothyroidism     Past Surgical History:  Procedure Laterality Date   BASAL CELL CARCINOMA EXCISION     right hip,abd, right arm, back neck   BREAST CYST ASPIRATION Left    negative   COLONOSCOPY WITH PROPOFOL N/A 08/24/2020   Procedure: COLONOSCOPY WITH PROPOFOL;  Surgeon: Lin Landsman, MD;  Location: [facility];  Service: Gastroenterology;  Laterality: N/A;   CTS releast Rt wrist     ESOPHAGOGASTRODUODENOSCOPY (EGD) WITH PROPOFOL N/A 04/22/2022   Procedure: ESOPHAGOGASTRODUODENOSCOPY (EGD) WITH PROPOFOL;  Surgeon: Lin Landsman, MD;  Location: [facility];  Service: Gastroenterology;  Laterality: N/A;   lipoma removal     Rt shoulder    MELANOMA EXCISION  right buttocks, top of scalp   POLYPECTOMY     + TA   TUBAL LIGATION  1982    Current Outpatient Medications:    aluminum hydroxide-magnesium carbonate (GAVISCON) 95-358 MG/15ML SUSP, Take by mouth as needed., Disp: , Rfl:    azelastine (ASTELIN) 0.1 % nasal spray, USE 1 SPRAY IN BOTH NOSTRILS DAILY AS NEEDED, Disp: 30 mL,  Rfl: 0   cholecalciferol (VITAMIN D) 1000 units tablet, Take 1,000 Units by mouth daily., Disp: , Rfl:    Cyanocobalamin (VITAMIN B-12 PO), Take by mouth., Disp: , Rfl:    Eszopiclone 3 MG TABS, TAKE ONE-HALF TABLET BY MOUTH IMMEDIATELY BEFORE BEDTIME, Disp: 30 tablet, Rfl: 1   irbesartan (AVAPRO) 75 MG tablet, TAKE ONE TABLET BY MOUTH EVERY DAY, Disp: 90 tablet, Rfl: 1   metoCLOPramide (REGLAN) 5 MG tablet, Take 1 tablet (5 mg total) by mouth 3 (three) times daily before meals., Disp: 30 tablet, Rfl: 0   Nutritional Supplements (NUTRITIONAL SUPPLEMENT PO), Take by mouth. DoTERRA: ALPHA CRS+, xEO MEGA, MICROPLEX MVp, ON GUARD (protective blend), Disp: , Rfl:    ondansetron (ZOFRAN ODT) 4 MG disintegrating tablet, Take 1 tablet (4 mg total) by mouth every 8 (eight) hours as needed for nausea or vomiting., Disp: 20 tablet, Rfl: 0   rosuvastatin (CRESTOR) 10 MG tablet, TAKE 1 TABLET BY MOUTH DAILY, Disp: 90 tablet, Rfl: 1   sucralfate (CARAFATE) 1 g tablet, Take 1 tablet (1 g total) by mouth 2 (two) times daily., Disp: 60 tablet, Rfl: 1   sucralfate (CARAFATE) 1 GM/10ML suspension, Take 10 mLs (1 g total) by mouth 4 (four) times daily -  with meals and at bedtime., Disp: 420 mL, Rfl: 0   SYNTHROID 175 MCG tablet, Take one tablet every morning with a glass of water 30 to 60 minutes before breakfast, Disp: 90 tablet, Rfl: 2   VITAMIN D PO, Take by mouth., Disp: , Rfl:    Famotidine-Ca Carb-Mag Hydrox (PEPCID COMPLETE PO), , Disp: , Rfl:    Family History  Problem Relation Age of Onset   Cervical cancer Mother    Arthritis Mother    Hypertension Mother    Sudden death Mother        cerbral hemorrage   Heart disease Father    Cancer Father        lung cancer   Diabetes Maternal Grandmother    Breast cancer Paternal Aunt 74   Colon cancer Neg Hx      Social History   Tobacco Use   Smoking status: Former    Types: Cigarettes    Quit date: 07/16/1980    Years since quitting: 42.0    Smokeless tobacco: Never  Vaping Use   Vaping Use: Never used  Substance Use Topics   Alcohol use: Yes    Alcohol/week: 7.0 standard drinks of alcohol    Types: 7 Glasses of wine per week    Comment: 1 per day . NONE IN 4 WEEKS   Drug use: No    Allergies as of 07/19/2022 - Review Complete 07/19/2022  Allergen Reaction Noted   Codeine     Penicillins Hives 06/26/2017    Review of Systems:    All systems reviewed and negative except where noted in HPI.   Physical Exam:  BP (!) 162/91 (BP Location: Right Arm, Patient Position: Sitting, Cuff Size: Normal)   Pulse 70   Temp 98.8 F (37.1 C) (Oral)   Ht '5\' 2"'$  (1.575 m)  Wt 145 lb 6.4 oz (66 kg)   BMI 26.59 kg/m  No LMP recorded. Patient is postmenopausal.  General:   Alert,  Well-developed, well-nourished, pleasant and cooperative in NAD Head:  Normocephalic and atraumatic. Eyes:  Sclera clear, no icterus.   Conjunctiva pink. Ears:  Normal auditory acuity. Nose:  No deformity, discharge, or lesions. Mouth:  No deformity or lesions,oropharynx pink & moist. Neck:  Supple; no masses or thyromegaly. Lungs:  Respirations even and unlabored.  Clear throughout to auscultation.   No wheezes, crackles, or rhonchi. No acute distress. Heart:  Regular rate and rhythm; no murmurs, clicks, rubs, or gallops. Abdomen:  Normal bowel sounds. Soft, non-tender and non-distended without masses, hepatosplenomegaly or hernias noted.  No guarding or rebound tenderness.   Rectal: Not performed Msk:  Symmetrical without gross deformities. Good, equal movement & strength bilaterally. Pulses:  Normal pulses noted. Extremities:  No clubbing or edema.  No cyanosis. Neurologic:  Alert and oriented x3;  grossly normal neurologically. Skin:  Intact without significant lesions or rashes. No jaundice. Psych:  Alert and cooperative. Normal mood and affect.  Imaging Studies: No abdominal imaging  Assessment and Plan:   [name] [name] is a 71 y.o.  pleasant Caucasian female with history of hypothyroidism, hypertension, hypercholesterolemia, chronic GERD.  Patient had known history of mild erosive esophagitis and dilation in the past.  She presents for follow-up of recent exacerbation of GERD which is fairly under control on omeprazole 20 mg twice daily before meals.  She underwent EGD in 04/2022 with esophageal biopsies, gastric and duodenal biopsies which were all unremarkable Given intermittent flareup of symptoms, recommend to increase omeprazole to 40 mg twice daily before meals for 1 month and then decrease the dose Continue antireflux lifestyle Patient is scheduled to undergo esophageal manometry at [facility] Patient has stable right lateral abdominal wall hernia and asymptomatic.  I do not recommend any surgical repair at this time  Hypertension Patient blood pressure is elevated in office today, it has been rechecked before she left Advised patient to follow-up with her PCP regarding adjustment of her blood pressure medication  Follow up as needed   Cephas Darby, MD

## 2022-07-20 ENCOUNTER — Encounter: Payer: Self-pay | Admitting: Internal Medicine

## 2022-07-20 NOTE — Telephone Encounter (Signed)
I can work her in tomorrow pm if she can come. May have to wait a little, if I am running behind.

## 2022-07-21 ENCOUNTER — Encounter: Payer: Self-pay | Admitting: Internal Medicine

## 2022-07-21 ENCOUNTER — Ambulatory Visit (INDEPENDENT_AMBULATORY_CARE_PROVIDER_SITE_OTHER): Payer: PPO | Admitting: Internal Medicine

## 2022-07-21 VITALS — BP 120/70 | HR 60 | Temp 98.4°F | Ht 62.0 in | Wt 145.0 lb

## 2022-07-21 DIAGNOSIS — E039 Hypothyroidism, unspecified: Secondary | ICD-10-CM

## 2022-07-21 DIAGNOSIS — I10 Essential (primary) hypertension: Secondary | ICD-10-CM

## 2022-07-21 DIAGNOSIS — E785 Hyperlipidemia, unspecified: Secondary | ICD-10-CM

## 2022-07-21 DIAGNOSIS — K219 Gastro-esophageal reflux disease without esophagitis: Secondary | ICD-10-CM | POA: Diagnosis not present

## 2022-07-21 NOTE — Progress Notes (Signed)
Patient ID: [name] [name], female   DOB: 1950/09/19, 71 y.o.   MRN: [id number]   Subjective:    Patient ID: [name] [name], female    DOB: 05-Oct-1950, 71 y.o.   MRN: [id number]   Patient here for  Chief Complaint  Patient presents with   Follow-up    Hypertension    .   HPI Here as a work in with concerns regarding persistent elevated blood pressure.  Increased stress recently.  Discussed.  Blood pressure remaining elevated.  On avapro.  No chest pain or sob reported.  GI symptoms have improved.  On prilosec '20mg'$  in am and '40mg'$  q pm.  Also takes carafate.  Planning for esophageal manometry in January.  Overall doing better than previous.     Past Medical History:  Diagnosis Date   Adenomatous polyp 2005   Arrhythmia    H/O   Basal cell carcinoma    CTS (carpal tunnel syndrome)    Environmental allergies    History of chicken pox    HTN (hypertension)    Hypercholesterolemia    Hypothyroidism    Past Surgical History:  Procedure Laterality Date   BASAL CELL CARCINOMA EXCISION     right hip,abd, right arm, back neck   BREAST CYST ASPIRATION Left    negative   COLONOSCOPY WITH PROPOFOL N/A 08/24/2020   Procedure: COLONOSCOPY WITH PROPOFOL;  Surgeon: Lin Landsman, MD;  Location: [facility];  Service: Gastroenterology;  Laterality: N/A;   CTS releast Rt wrist     ESOPHAGOGASTRODUODENOSCOPY (EGD) WITH PROPOFOL N/A 04/22/2022   Procedure: ESOPHAGOGASTRODUODENOSCOPY (EGD) WITH PROPOFOL;  Surgeon: Lin Landsman, MD;  Location: [place] [facility];  Service: Gastroenterology;  Laterality: N/A;   lipoma removal     Rt shoulder    MELANOMA EXCISION     right buttocks, top of scalp   POLYPECTOMY     + TA   TUBAL LIGATION  1982   Family History  Problem Relation Age of Onset   Cervical cancer Mother    Arthritis Mother    Hypertension Mother    Sudden death Mother        cerbral hemorrage   Heart disease Father    Cancer Father        lung cancer   Diabetes Maternal  Grandmother    Breast cancer Paternal Aunt 75   Colon cancer Neg Hx    Social History   Socioeconomic History   Marital status: Widowed    Spouse name: Not on file   Number of children: 1   Years of education: Not on file   Highest education level: Not on file  Occupational History   Occupation: [employer]/ Ortho     Employer: [employer]  Tobacco Use   Smoking status: Former    Types: Cigarettes    Quit date: 07/16/1980    Years since quitting: 42.0   Smokeless tobacco: Never  Vaping Use   Vaping Use: Never used  Substance and Sexual Activity   Alcohol use: Yes    Alcohol/week: 7.0 standard drinks of alcohol    Types: 7 Glasses of wine per week    Comment: 1 per day . NONE IN 4 WEEKS   Drug use: No   Sexual activity: Never  Other Topics Concern   Not on file  Social History Narrative   Widowed; 1 son.    Retired Art therapist   Daily caffeine use 2-3/day       Cell #  [phone]   Social Determinants of Health   Financial Resource Strain: Low Risk  (02/23/2022)   Overall Financial Resource Strain (CARDIA)    Difficulty of Paying Living Expenses: Not hard at all  Food Insecurity: No Food Insecurity (02/23/2022)   Hunger Vital Sign    Worried About Running Out of Food in the Last Year: Never true    Ran Out of Food in the Last Year: Never true  Transportation Needs: No Transportation Needs (02/23/2022)   PRAPARE - Hydrologist (Medical): No    Lack of Transportation (Non-Medical): No  Physical Activity: Sufficiently Active (02/23/2022)   Exercise Vital Sign    Days of Exercise per Week: 7 days    Minutes of Exercise per Session: 60 min  Stress: No Stress Concern Present (02/23/2022)   [place]    Feeling of Stress : Not at all  Social Connections: Unknown (02/23/2022)   Social Connection and Isolation Panel [NHANES]    Frequency of Communication with Friends  and Family: More than three times a week    Frequency of Social Gatherings with Friends and Family: More than three times a week    Attends Religious Services: Not on Advertising copywriter or Organizations: Not on file    Attends Archivist Meetings: Not on file    Marital Status: Not on file     Review of Systems  Constitutional:  Negative for appetite change and unexpected weight change.  HENT:  Negative for congestion and sinus pressure.   Respiratory:  Negative for cough, chest tightness and shortness of breath.   Cardiovascular:  Negative for chest pain, palpitations and leg swelling.  Gastrointestinal:  Negative for abdominal pain, diarrhea, nausea and vomiting.  Genitourinary:  Negative for difficulty urinating and dysuria.  Musculoskeletal:  Negative for joint swelling and myalgias.  Skin:  Negative for color change and rash.  Neurological:  Negative for dizziness and headaches.  Psychiatric/Behavioral:  Negative for agitation and dysphoric mood.        Objective:     BP 120/70   Pulse 60   Temp 98.4 F (36.9 C) (Oral)   Ht '5\' 2"'$  (1.575 m)   Wt 145 lb (65.8 kg)   SpO2 98%   BMI 26.52 kg/m  Wt Readings from Last 3 Encounters:  07/21/22 145 lb (65.8 kg)  07/19/22 145 lb 6.4 oz (66 kg)  07/13/22 144 lb (65.3 kg)    Physical Exam Vitals reviewed.  Constitutional:      General: She is not in acute distress.    Appearance: Normal appearance.  HENT:     Head: Normocephalic and atraumatic.     Right Ear: External ear normal.     Left Ear: External ear normal.  Eyes:     General: No scleral icterus.       Right eye: No discharge.        Left eye: No discharge.     Conjunctiva/sclera: Conjunctivae normal.  Neck:     Thyroid: No thyromegaly.  Cardiovascular:     Rate and Rhythm: Normal rate and regular rhythm.  Pulmonary:     Effort: No respiratory distress.     Breath sounds: Normal breath sounds. No wheezing.  Abdominal:     General:  Bowel sounds are normal.     Palpations: Abdomen is soft.     Tenderness: There is no abdominal tenderness.  Musculoskeletal:        General: No swelling or tenderness.     Cervical back: Neck supple. No tenderness.  Lymphadenopathy:     Cervical: No cervical adenopathy.  Skin:    Findings: No erythema or rash.  Neurological:     Mental Status: She is alert.  Psychiatric:        Mood and Affect: Mood normal.        Behavior: Behavior normal.      Outpatient Encounter Medications as of 07/21/2022  Medication Sig   aluminum hydroxide-magnesium carbonate (GAVISCON) 95-358 MG/15ML SUSP Take by mouth as needed.   azelastine (ASTELIN) 0.1 % nasal spray USE 1 SPRAY IN BOTH NOSTRILS DAILY AS NEEDED   cholecalciferol (VITAMIN D) 1000 units tablet Take 1,000 Units by mouth daily.   Cyanocobalamin (VITAMIN B-12 PO) Take by mouth.   Eszopiclone 3 MG TABS TAKE ONE-HALF TABLET BY MOUTH IMMEDIATELY BEFORE BEDTIME   irbesartan (AVAPRO) 150 MG tablet Take 1 tablet (150 mg total) by mouth daily.   metoCLOPramide (REGLAN) 5 MG tablet Take 1 tablet (5 mg total) by mouth 3 (three) times daily before meals.   Nutritional Supplements (NUTRITIONAL SUPPLEMENT PO) Take by mouth. DoTERRA: ALPHA CRS+, xEO MEGA, MICROPLEX MVp, ON GUARD (protective blend)   omeprazole (PRILOSEC) 40 MG capsule Take 1 capsule (40 mg total) by mouth 2 (two) times daily.   ondansetron (ZOFRAN ODT) 4 MG disintegrating tablet Take 1 tablet (4 mg total) by mouth every 8 (eight) hours as needed for nausea or vomiting.   rosuvastatin (CRESTOR) 10 MG tablet TAKE 1 TABLET BY MOUTH DAILY   sucralfate (CARAFATE) 1 g tablet Take 1 tablet (1 g total) by mouth 2 (two) times daily.   sucralfate (CARAFATE) 1 GM/10ML suspension Take 10 mLs (1 g total) by mouth 4 (four) times daily -  with meals and at bedtime.   SYNTHROID 175 MCG tablet Take one tablet every morning with a glass of water 30 to 60 minutes before breakfast   VITAMIN D PO Take by  mouth.   [DISCONTINUED] irbesartan (AVAPRO) 75 MG tablet TAKE ONE TABLET BY MOUTH EVERY DAY   [DISCONTINUED] Famotidine-Ca Carb-Mag Hydrox (PEPCID COMPLETE PO)  (Patient not taking: Reported on 07/19/2022)   No facility-administered encounter medications on file as of 07/21/2022.     Lab Results  Component Value Date   WBC 6.0 06/27/2022   HGB 13.5 06/27/2022   HCT 40.0 06/27/2022   PLT 250.0 06/27/2022   GLUCOSE 88 06/27/2022   CHOL 170 03/28/2022   TRIG 79.0 03/28/2022   HDL 72.60 03/28/2022   LDLDIRECT 118.2 10/16/2006   LDLCALC 82 03/28/2022   ALT 23 06/27/2022   AST 19 06/27/2022   NA 139 06/27/2022   K 4.8 06/27/2022   CL 103 06/27/2022   CREATININE 0.78 06/27/2022   BUN 16 06/27/2022   CO2 30 06/27/2022   TSH 0.49 03/28/2022    CT Abdomen Pelvis W Contrast  Result Date: 07/14/2022 CLINICAL DATA:  Epigastric abdominal pain EXAM: CT ABDOMEN AND PELVIS WITH CONTRAST TECHNIQUE: Multidetector CT imaging of the abdomen and pelvis was performed using the standard protocol following bolus administration of intravenous contrast. RADIATION DOSE REDUCTION: This exam was performed according to the departmental dose-optimization program which includes automated exposure control, adjustment of the mA and/or kV according to patient size and/or use of iterative reconstruction technique. CONTRAST:  171m OMNIPAQUE IOHEXOL 300 MG/ML  SOLN COMPARISON:  06/14/2020 FINDINGS: Lower chest: No acute abnormality. Hepatobiliary: Mild  hepatic steatosis. Stable hypodensity within the left hepatic lobe medially likely representing a small hemangioma, stable since on a g of 05/22/2015. Mild focal fatty hepatic infiltration adjacent the falciform ligament. No enhancing intrahepatic mass. No intra or extrahepatic biliary ductal dilation. Gallbladder unremarkable. Pancreas: Unremarkable Spleen: Unremarkable Adrenals/Urinary Tract: Adrenal glands are unremarkable. Kidneys are normal, without renal calculi, focal  lesion, or hydronephrosis. Bladder is mildly distended but is otherwise unremarkable. Stomach/Bowel: Stomach is within normal limits. Appendix appears normal. No evidence of bowel wall thickening, distention, or inflammatory changes. No free intraperitoneal gas or fluid. Vascular/Lymphatic: Aortic atherosclerosis. No enlarged abdominal or pelvic lymph nodes. Reproductive: Uterus and bilateral adnexa are unremarkable. Other: No abdominal wall hernia or abnormality. No abdominopelvic ascites. Musculoskeletal: Stable 7.4 cm right lateral abdominal wall hernia insinuating between the internal oblique and transverse abdominus musculature. No acute bone abnormality. No lytic or blastic bone lesion. Osseous structures are age-appropriate. IMPRESSION: 1. No acute intra-abdominal pathology identified. No definite radiographic explanation for the patient's reported symptoms. 2. Mild hepatic steatosis. 3. Stable 7.4 cm right lateral abdominal wall hernia insinuating between the internal oblique and transverse abdominus musculature. 4. Aortic atherosclerosis. Aortic Atherosclerosis (ICD10-I70.0). Electronically Signed   By: Fidela Salisbury M.D.   On: 07/14/2022 13:31       Assessment & Plan:   Problem List Items Addressed This Visit     Essential hypertension    Blood pressure remaining elevated.  Increase avapro to '150mg'$  q day.  Follow pressures.  Follow metabolic panel.       Relevant Medications   irbesartan (AVAPRO) 150 MG tablet   GERD (gastroesophageal reflux disease) - Primary    On prilosec now.  Just saw Dr Marius Ditch.  CT as outlined.  No acute abnormality.  Symptoms have improved.  Taking prilosec '20mg'$  in am and '40mg'$  q pm.  Carafate as directed.  Planning for esophageal manometry in January.  Follow.       Hyperlipidemia     On crestor.  Low cholesterol diet and exercise.  Follow lipid panel and liver function tests.  . Lab Results  Component Value Date   CHOL 170 03/28/2022   HDL 72.60 03/28/2022    LDLCALC 82 03/28/2022   LDLDIRECT 118.2 10/16/2006   TRIG 79.0 03/28/2022   CHOLHDL 2 03/28/2022       Relevant Medications   irbesartan (AVAPRO) 150 MG tablet   Hypothyroidism    On thyroid replacement.  Follow tsh.         Einar Pheasant, MD

## 2022-07-25 ENCOUNTER — Encounter: Payer: Self-pay | Admitting: Internal Medicine

## 2022-07-25 MED ORDER — IRBESARTAN 150 MG PO TABS: 150.0000 mg | ORAL_TABLET | Freq: Every day | ORAL | 3 refills | Status: DC

## 2022-07-25 NOTE — Assessment & Plan Note (Signed)
On thyroid replacement.  Follow tsh.  

## 2022-07-25 NOTE — Assessment & Plan Note (Signed)
On crestor.  Low cholesterol diet and exercise.  Follow lipid panel and liver function tests.  . Lab Results  Component Value Date   CHOL 170 03/28/2022   HDL 72.60 03/28/2022   LDLCALC 82 03/28/2022   LDLDIRECT 118.2 10/16/2006   TRIG 79.0 03/28/2022   CHOLHDL 2 03/28/2022

## 2022-07-25 NOTE — Assessment & Plan Note (Signed)
On prilosec now.  Just saw Dr Marius Ditch.  CT as outlined.  No acute abnormality.  Symptoms have improved.  Taking prilosec '20mg'$  in am and '40mg'$  q pm.  Carafate as directed.  Planning for esophageal manometry in January.  Follow.

## 2022-07-25 NOTE — Assessment & Plan Note (Signed)
Blood pressure remaining elevated.  Increase avapro to '150mg'$  q day.  Follow pressures.  Follow metabolic panel.

## 2022-07-31 ENCOUNTER — Encounter: Payer: Self-pay | Admitting: Internal Medicine

## 2022-08-02 NOTE — Telephone Encounter (Signed)
Please call and confirm doing ok.  Given that we just made the change in her medication, I would like to give it a little longer and see if blood pressure levels out.  Have her continue to spot check her pressure and send in readings.

## 2022-08-02 NOTE — Telephone Encounter (Signed)
Pt returning call

## 2022-08-02 NOTE — Telephone Encounter (Signed)
L/M FOR PT. TO C/B.

## 2022-08-03 NOTE — Telephone Encounter (Signed)
Pt advised.

## 2022-09-04 ENCOUNTER — Encounter: Payer: Self-pay | Admitting: Internal Medicine

## 2022-09-05 ENCOUNTER — Telehealth: Payer: Self-pay | Admitting: Gastroenterology

## 2022-09-05 NOTE — Telephone Encounter (Signed)
Inbound call from patient stating she needs to reschedule her procedure with Dr. Silverio [name] for 1/24 due to possibly having a sinus infection. Patient is requesting a call back to discuss rescheduling. Please advise.

## 2022-09-05 NOTE — Telephone Encounter (Signed)
BP readings from [name] [name].

## 2022-09-05 NOTE — Telephone Encounter (Signed)
Returned call to patient. She currently has a sinus infection and needs to reschedule Esophageal manometry. Next available appt for Manometry is 11/30/22 at 12:30 pm. I advised pt that we will mail her new instructions. Pt verbalized understanding and had no concerns at the end of the call.

## 2022-09-06 ENCOUNTER — Encounter: Payer: Self-pay | Admitting: Nurse Practitioner

## 2022-09-06 ENCOUNTER — Other Ambulatory Visit: Payer: Self-pay | Admitting: Internal Medicine

## 2022-09-06 ENCOUNTER — Ambulatory Visit (INDEPENDENT_AMBULATORY_CARE_PROVIDER_SITE_OTHER): Payer: PPO | Admitting: Nurse Practitioner

## 2022-09-06 VITALS — BP 128/80 | HR 61 | Temp 98.7°F | Ht 62.0 in | Wt 146.4 lb

## 2022-09-06 DIAGNOSIS — R6889 Other general symptoms and signs: Secondary | ICD-10-CM | POA: Diagnosis not present

## 2022-09-06 DIAGNOSIS — J329 Chronic sinusitis, unspecified: Secondary | ICD-10-CM | POA: Diagnosis not present

## 2022-09-06 DIAGNOSIS — Z1152 Encounter for screening for COVID-19: Secondary | ICD-10-CM | POA: Diagnosis not present

## 2022-09-06 LAB — POCT INFLUENZA A/B
Influenza A, POC: NEGATIVE
Influenza B, POC: NEGATIVE

## 2022-09-06 LAB — POC COVID19 BINAXNOW: SARS Coronavirus 2 Ag: NEGATIVE

## 2022-09-06 MED ORDER — BENZONATATE 200 MG PO CAPS: 200.0000 mg | ORAL_CAPSULE | Freq: Three times a day (TID) | ORAL | 0 refills | Status: DC | PRN

## 2022-09-06 MED ORDER — METHYLPREDNISOLONE 4 MG PO TBPK: ORAL_TABLET | ORAL | 0 refills | Status: DC

## 2022-09-06 MED ORDER — DOXYCYCLINE HYCLATE 100 MG PO TABS: 100.0000 mg | ORAL_TABLET | Freq: Two times a day (BID) | ORAL | 0 refills | Status: DC

## 2022-09-06 NOTE — Assessment & Plan Note (Addendum)
COVID and flu negative in office. Given patient's history and symptoms likely a sinus infection. Will treat with Doxy 100 BID x 7 days and MDP. Advised patient to continue nasal sprays daily. Encouraged adequate fluid intake. Follow up with ENT.

## 2022-09-06 NOTE — Progress Notes (Signed)
Tomasita Morrow, NP-C Phone: [phone]  [name] [name] is a 72 y.o. female who presents today for cough and congestion x 1 week. She has seen ENT in the past for chronic sinusitis. She has had 2 negative COVID tests at home.   Respiratory illness:  Cough- Yes  Congestion-    Sinus- Yes, nasal congestion, facial pressure and tenderness   Chest- No  Post nasal drip- Yes  Sore throat- Yes  Shortness of breath- No  Fever- No  Fatigue/Myalgia- Yes Headache- Yes Nausea/Vomiting- No Taste disturbance- No  Smell disturbance- No  Covid exposure- No  Covid vaccination- x 3  Flu vaccination- UTD  Medications- Nasal sprays, Advil and Guaifenesin   Social History   Tobacco Use  Smoking Status Former   Types: Cigarettes   Quit date: 07/16/1980   Years since quitting: 42.1  Smokeless Tobacco Never    Current Outpatient Medications on File Prior to Visit  Medication Sig Dispense Refill   aluminum hydroxide-magnesium carbonate (GAVISCON) 95-358 MG/15ML SUSP Take by mouth as needed.     azelastine (ASTELIN) 0.1 % nasal spray USE 1 SPRAY IN BOTH NOSTRILS DAILY AS NEEDED 30 mL 0   cholecalciferol (VITAMIN D) 1000 units tablet Take 1,000 Units by mouth daily.     Cyanocobalamin (VITAMIN B-12 PO) Take by mouth.     Eszopiclone 3 MG TABS TAKE ONE-HALF TABLET BY MOUTH IMMEDIATELY BEFORE BEDTIME 30 tablet 1   irbesartan (AVAPRO) 150 MG tablet Take 1 tablet (150 mg total) by mouth daily. 30 tablet 3   metoCLOPramide (REGLAN) 5 MG tablet Take 1 tablet (5 mg total) by mouth 3 (three) times daily before meals. 30 tablet 0   Nutritional Supplements (NUTRITIONAL SUPPLEMENT PO) Take by mouth. DoTERRA: ALPHA CRS+, xEO MEGA, MICROPLEX MVp, ON GUARD (protective blend)     omeprazole (PRILOSEC) 40 MG capsule Take 1 capsule (40 mg total) by mouth 2 (two) times daily. 60 capsule 2   ondansetron (ZOFRAN ODT) 4 MG disintegrating tablet Take 1 tablet (4 mg total) by mouth every 8 (eight) hours as needed for  nausea or vomiting. 20 tablet 0   rosuvastatin (CRESTOR) 10 MG tablet TAKE 1 TABLET BY MOUTH DAILY 90 tablet 1   sucralfate (CARAFATE) 1 g tablet Take 1 tablet (1 g total) by mouth 2 (two) times daily. 60 tablet 1   sucralfate (CARAFATE) 1 GM/10ML suspension Take 10 mLs (1 g total) by mouth 4 (four) times daily -  with meals and at bedtime. 420 mL 0   SYNTHROID 175 MCG tablet Take one tablet every morning with a glass of water 30 to 60 minutes before breakfast 90 tablet 2   VITAMIN D PO Take by mouth.     No current facility-administered medications on file prior to visit.     ROS see history of present illness  Objective  Physical Exam Vitals:   09/06/22 0812  BP: 128/80  Pulse: 61  Temp: 98.7 F (37.1 C)  SpO2: 98%    BP Readings from Last 3 Encounters:  09/06/22 128/80  07/21/22 120/70  07/19/22 (!) 162/91   Wt Readings from Last 3 Encounters:  09/06/22 146 lb 6.4 oz (66.4 kg)  07/21/22 145 lb (65.8 kg)  07/19/22 145 lb 6.4 oz (66 kg)    Physical Exam Constitutional:      General: She is not in acute distress.    Appearance: Normal appearance.  HENT:     Head: Normocephalic.     Right Ear: Tympanic  membrane normal.     Left Ear: Tympanic membrane normal.     Nose: Congestion present.     Mouth/Throat:     Mouth: Mucous membranes are moist.     Pharynx: Oropharynx is clear.  Eyes:     Conjunctiva/sclera: Conjunctivae normal.     Pupils: Pupils are equal, round, and reactive to light.  Cardiovascular:     Rate and Rhythm: Normal rate and regular rhythm.     Heart sounds: Normal heart sounds.  Pulmonary:     Effort: Pulmonary effort is normal.     Breath sounds: Normal breath sounds.  Abdominal:     General: Abdomen is flat. Bowel sounds are normal.     Palpations: Abdomen is soft.  Skin:    General: Skin is warm and dry.  Neurological:     Mental Status: She is alert.  Psychiatric:        Mood and Affect: Mood normal.        Behavior: Behavior  normal.    Assessment/Plan: Please see individual problem list.  Chronic sinusitis, unspecified location Assessment & Plan: COVID and flu negative in office. Given patient's history and symptoms likely a sinus infection. Will treat with Doxy 100 BID x 7 days and MDP. Advised patient to continue nasal sprays daily. Encouraged adequate fluid intake. Follow up with ENT.  Orders: -     methylPREDNISolone; Take as directed.  Dispense: 21 each; Refill: 0 -     Doxycycline Hyclate; Take 1 tablet (100 mg total) by mouth 2 (two) times daily.  Dispense: 14 tablet; Refill: 0 -     Benzonatate; Take 1 capsule (200 mg total) by mouth 3 (three) times daily as needed for cough.  Dispense: 30 capsule; Refill: 0  Flu-like symptoms -     POCT Influenza A/B  Encounter for screening for COVID-19 -     POC COVID-19 BinaxNow    Return if symptoms worsen or fail to improve.   Tomasita Morrow, NP-C [place]

## 2022-09-08 ENCOUNTER — Other Ambulatory Visit (INDEPENDENT_AMBULATORY_CARE_PROVIDER_SITE_OTHER): Payer: PPO

## 2022-09-08 DIAGNOSIS — I10 Essential (primary) hypertension: Secondary | ICD-10-CM | POA: Diagnosis not present

## 2022-09-08 DIAGNOSIS — E785 Hyperlipidemia, unspecified: Secondary | ICD-10-CM | POA: Diagnosis not present

## 2022-09-08 LAB — BASIC METABOLIC PANEL
BUN: 20 mg/dL (ref 6–23)
CO2: 29 mEq/L (ref 19–32)
Calcium: 9.9 mg/dL (ref 8.4–10.5)
Chloride: 100 mEq/L (ref 96–112)
Creatinine, Ser: 0.58 mg/dL (ref 0.40–1.20)
GFR: 89.9 mL/min (ref >60.00)
Glucose, Bld: 114 mg/dL — ABNORMAL HIGH (ref 70–99)
Potassium: 5.2 mEq/L — ABNORMAL HIGH (ref 3.5–5.1)
Sodium: 138 mEq/L (ref 135–145)

## 2022-09-08 LAB — HEPATIC FUNCTION PANEL
ALT: 19 U/L (ref 0–35)
AST: 14 U/L (ref 0–37)
Albumin: 4.7 g/dL (ref 3.5–5.2)
Alkaline Phosphatase: 99 U/L (ref 39–117)
Bilirubin, Direct: 0.1 mg/dL (ref 0.0–0.3)
Total Bilirubin: 0.4 mg/dL (ref 0.2–1.2)
Total Protein: 6.8 g/dL (ref 6.0–8.3)

## 2022-09-08 LAB — LIPID PANEL
Cholesterol: 181 mg/dL (ref 0–200)
HDL: 86.1 mg/dL (ref >39.00)
LDL Cholesterol: 81 mg/dL (ref 0–99)
NonHDL: 94.47
Total CHOL/HDL Ratio: 2
Triglycerides: 67 mg/dL (ref 0.0–149.0)
VLDL: 13.4 mg/dL (ref 0.0–40.0)

## 2022-09-09 ENCOUNTER — Other Ambulatory Visit: Payer: Self-pay

## 2022-09-09 DIAGNOSIS — E875 Hyperkalemia: Secondary | ICD-10-CM

## 2022-09-12 ENCOUNTER — Ambulatory Visit (INDEPENDENT_AMBULATORY_CARE_PROVIDER_SITE_OTHER): Payer: PPO | Admitting: Internal Medicine

## 2022-09-12 ENCOUNTER — Encounter: Payer: Self-pay | Admitting: Internal Medicine

## 2022-09-12 VITALS — BP 130/78 | HR 77 | Temp 98.0°F | Resp 16 | Ht 62.0 in | Wt 146.2 lb

## 2022-09-12 DIAGNOSIS — E039 Hypothyroidism, unspecified: Secondary | ICD-10-CM

## 2022-09-12 DIAGNOSIS — K219 Gastro-esophageal reflux disease without esophagitis: Secondary | ICD-10-CM | POA: Diagnosis not present

## 2022-09-12 DIAGNOSIS — Z1231 Encounter for screening mammogram for malignant neoplasm of breast: Secondary | ICD-10-CM

## 2022-09-12 DIAGNOSIS — I700 Atherosclerosis of aorta: Secondary | ICD-10-CM

## 2022-09-12 DIAGNOSIS — E875 Hyperkalemia: Secondary | ICD-10-CM | POA: Diagnosis not present

## 2022-09-12 DIAGNOSIS — G479 Sleep disorder, unspecified: Secondary | ICD-10-CM

## 2022-09-12 DIAGNOSIS — Z860100 Personal history of colon polyps, unspecified: Secondary | ICD-10-CM

## 2022-09-12 DIAGNOSIS — R739 Hyperglycemia, unspecified: Secondary | ICD-10-CM | POA: Diagnosis not present

## 2022-09-12 DIAGNOSIS — E785 Hyperlipidemia, unspecified: Secondary | ICD-10-CM

## 2022-09-12 DIAGNOSIS — F439 Reaction to severe stress, unspecified: Secondary | ICD-10-CM | POA: Diagnosis not present

## 2022-09-12 DIAGNOSIS — I10 Essential (primary) hypertension: Secondary | ICD-10-CM | POA: Diagnosis not present

## 2022-09-12 LAB — POTASSIUM: Potassium: 4.6 mEq/L (ref 3.5–5.1)

## 2022-09-12 LAB — HEMOGLOBIN A1C: Hgb A1c MFr Bld: 5.5 % (ref 4.6–6.5)

## 2022-09-12 MED ORDER — ESZOPICLONE 3 MG PO TABS: ORAL_TABLET | ORAL | 1 refills | Status: DC

## 2022-09-12 NOTE — Progress Notes (Unsigned)
Subjective:    Patient ID: [name] [name], female    DOB: 09/07/1950, 72 y.o.   MRN: [id number]  Patient here for  Chief Complaint  Patient presents with   Hypertension    HPI Here for f/u regarding hypertension and increased stress.  Also had been having increased GI issues.  Previously on prilosec - '20mg'$  in am and '40mg'$  in pm, along with carafate. GI symptoms have improved.  She is not just taking prilosec '40mg'$  in the morning.  Not needing the pm dose and not needing the carafate regularly.  Blood pressure elevated last visit.  Avapro increased to '150mg'$  q day.  Has been under increased stress recently.  This is better. Starting to get back in more of a regular routine.  Feels better.  Sleeping better.  Was evaluated 09/06/22 - cough and congestion.  Covid and flu negative in office.  Treated with doxycycline and medrol dosepak.  Is doing better.  Symptoms improved.  Trying to stay active.  No chest pain or sob reported.  No bowel change reported.  Starting weight watchers.  Blood pressures on outside checks:  124/68 and 130/72.    Past Medical History:  Diagnosis Date   Adenomatous polyp 2005   Arrhythmia    H/O   Basal cell carcinoma    CTS (carpal tunnel syndrome)    Environmental allergies    History of chicken pox    HTN (hypertension)    Hypercholesterolemia    Hypothyroidism    Past Surgical History:  Procedure Laterality Date   BASAL CELL CARCINOMA EXCISION     right hip,abd, right arm, back neck   BREAST CYST ASPIRATION Left    negative   COLONOSCOPY WITH PROPOFOL N/A 08/24/2020   Procedure: COLONOSCOPY WITH PROPOFOL;  Surgeon: Lin Landsman, MD;  Location: [facility];  Service: Gastroenterology;  Laterality: N/A;   CTS releast Rt wrist     ESOPHAGOGASTRODUODENOSCOPY (EGD) WITH PROPOFOL N/A 04/22/2022   Procedure: ESOPHAGOGASTRODUODENOSCOPY (EGD) WITH PROPOFOL;  Surgeon: Lin Landsman, MD;  Location: [facility];  Service: Gastroenterology;  Laterality:  N/A;   lipoma removal     Rt shoulder    MELANOMA EXCISION     right buttocks, top of scalp   POLYPECTOMY     + TA   TUBAL LIGATION  1982   Family History  Problem Relation Age of Onset   Cervical cancer Mother    Arthritis Mother    Hypertension Mother    Sudden death Mother        cerbral hemorrage   Heart disease Father    Cancer Father        lung cancer   Diabetes Maternal Grandmother    Breast cancer Paternal Aunt 72   Colon cancer Neg Hx    Social History   Socioeconomic History   Marital status: Widowed    Spouse name: Not on file   Number of children: 1   Years of education: Not on file   Highest education level: Not on file  Occupational History   Occupation: ScheduliOfficeng/ Ortho     Employer: [employer]  Tobacco Use   Smoking status: Former    Types: Cigarettes    Quit date: 07/16/1980    Years since quitting: 42.1   Smokeless tobacco: Never  Vaping Use   Vaping Use: Never used  Substance and Sexual Activity   Alcohol use: Yes    Alcohol/week: 7.0 standard drinks of alcohol  Types: 7 Glasses of wine per week    Comment: 1 per day . NONE IN 4 WEEKS   Drug use: No   Sexual activity: Never  Other Topics Concern   Not on file  Social History Narrative   Widowed; 1 son.    Retired Art therapist   Daily caffeine use 2-3/day       Cell # [id number]   Social Determinants of Health   Financial Resource Strain: Low Risk  (02/23/2022)   Overall Financial Resource Strain (CARDIA)    Difficulty of Paying Living Expenses: Not hard at all  Food Insecurity: No Food Insecurity (02/23/2022)   Hunger Vital Sign    Worried About Running Out of Food in the Last Year: Never true    [place] in the Last Year: Never true  Transportation Needs: No Transportation Needs (02/23/2022)   PRAPARE - Hydrologist (Medical): No    Lack of Transportation (Non-Medical): No  Physical Activity: Sufficiently Active (02/23/2022)    Exercise Vital Sign    Days of Exercise per Week: 7 days    Minutes of Exercise per Session: 60 min  Stress: No Stress Concern Present (02/23/2022)   [place]    Feeling of Stress : Not at all  Social Connections: Unknown (02/23/2022)   Social Connection and Isolation Panel [NHANES]    Frequency of Communication with Friends and Family: More than three times a week    Frequency of Social Gatherings with Friends and Family: More than three times a week    Attends Religious Services: Not on Advertising copywriter or Organizations: Not on file    Attends Archivist Meetings: Not on file    Marital Status: Not on file     Review of Systems  Constitutional:  Negative for appetite change and unexpected weight change.  HENT:  Negative for sinus pressure.        Congestion and sinus issues improved.   Respiratory:  Negative for cough, chest tightness and shortness of breath.   Cardiovascular:  Negative for chest pain, palpitations and leg swelling.  Gastrointestinal:  Negative for abdominal pain, diarrhea, nausea and vomiting.  Genitourinary:  Negative for difficulty urinating and dysuria.  Musculoskeletal:  Negative for joint swelling and myalgias.  Skin:  Negative for color change and rash.  Neurological:  Negative for dizziness and headaches.  Psychiatric/Behavioral:  Negative for agitation and dysphoric mood.        Objective:     BP 130/78   Pulse 77   Temp 98 F (36.7 C)   Resp 16   Ht '5\' 2"'$  (1.575 m)   Wt 146 lb 3.2 oz (66.3 kg)   SpO2 99%   BMI 26.74 kg/m  Wt Readings from Last 3 Encounters:  09/12/22 146 lb 3.2 oz (66.3 kg)  09/06/22 146 lb 6.4 oz (66.4 kg)  07/21/22 145 lb (65.8 kg)    Physical Exam Vitals reviewed.  Constitutional:      General: She is not in acute distress.    Appearance: Normal appearance.  HENT:     Head: Normocephalic and atraumatic.     Right Ear:  External ear normal.     Left Ear: External ear normal.  Eyes:     General: No scleral icterus.       Right eye: No discharge.        Left  eye: No discharge.     Conjunctiva/sclera: Conjunctivae normal.  Neck:     Thyroid: No thyromegaly.  Cardiovascular:     Rate and Rhythm: Normal rate and regular rhythm.  Pulmonary:     Effort: No respiratory distress.     Breath sounds: Normal breath sounds. No wheezing.  Abdominal:     General: Bowel sounds are normal.     Palpations: Abdomen is soft.     Tenderness: There is no abdominal tenderness.  Musculoskeletal:        General: No swelling or tenderness.     Cervical back: Neck supple. No tenderness.  Lymphadenopathy:     Cervical: No cervical adenopathy.  Skin:    Findings: No erythema or rash.  Neurological:     Mental Status: She is alert.  Psychiatric:        Mood and Affect: Mood normal.        Behavior: Behavior normal.      Outpatient Encounter Medications as of 09/12/2022  Medication Sig   aluminum hydroxide-magnesium carbonate (GAVISCON) 95-358 MG/15ML SUSP Take by mouth as needed.   azelastine (ASTELIN) 0.1 % nasal spray USE 1 SPRAY IN BOTH NOSTRILS DAILY AS NEEDED   benzonatate (TESSALON) 200 MG capsule Take 1 capsule (200 mg total) by mouth 3 (three) times daily as needed for cough.   cholecalciferol (VITAMIN D) 1000 units tablet Take 1,000 Units by mouth daily.   Cyanocobalamin (VITAMIN B-12 PO) Take by mouth.   Eszopiclone 3 MG TABS TAKE ONE-HALF TABLET BY MOUTH IMMEDIATELY BEFORE BEDTIME   irbesartan (AVAPRO) 150 MG tablet Take 1 tablet (150 mg total) by mouth daily.   Nutritional Supplements (NUTRITIONAL SUPPLEMENT PO) Take by mouth. DoTERRA: ALPHA CRS+, xEO MEGA, MICROPLEX MVp, ON GUARD (protective blend)   omeprazole (PRILOSEC) 40 MG capsule Take 1 capsule (40 mg total) by mouth 2 (two) times daily.   ondansetron (ZOFRAN ODT) 4 MG disintegrating tablet Take 1 tablet (4 mg total) by mouth every 8 (eight) hours  as needed for nausea or vomiting.   rosuvastatin (CRESTOR) 10 MG tablet TAKE 1 TABLET BY MOUTH DAILY   sucralfate (CARAFATE) 1 GM/10ML suspension Take 10 mLs (1 g total) by mouth 4 (four) times daily -  with meals and at bedtime.   SYNTHROID 175 MCG tablet Take one tablet every morning with a glass of water 30 to 60 minutes before breakfast   VITAMIN D PO Take by mouth.   [DISCONTINUED] doxycycline (VIBRA-TABS) 100 MG tablet Take 1 tablet (100 mg total) by mouth 2 (two) times daily.   [DISCONTINUED] Eszopiclone 3 MG TABS TAKE ONE-HALF TABLET BY MOUTH IMMEDIATELY BEFORE BEDTIME   [DISCONTINUED] methylPREDNISolone (MEDROL DOSEPAK) 4 MG TBPK tablet Take as directed.   [DISCONTINUED] metoCLOPramide (REGLAN) 5 MG tablet Take 1 tablet (5 mg total) by mouth 3 (three) times daily before meals.   [DISCONTINUED] sucralfate (CARAFATE) 1 g tablet Take 1 tablet (1 g total) by mouth 2 (two) times daily.   No facility-administered encounter medications on file as of 09/12/2022.     Lab Results  Component Value Date   WBC 6.0 06/27/2022   HGB 13.5 06/27/2022   HCT 40.0 06/27/2022   PLT 250.0 06/27/2022   GLUCOSE 114 (H) 09/08/2022   CHOL 181 09/08/2022   TRIG 67.0 09/08/2022   HDL 86.10 09/08/2022   LDLDIRECT 118.2 10/16/2006   LDLCALC 81 09/08/2022   ALT 19 09/08/2022   AST 14 09/08/2022   NA 138 09/08/2022   K  4.6 09/12/2022   CL 100 09/08/2022   CREATININE 0.58 09/08/2022   BUN 20 09/08/2022   CO2 29 09/08/2022   TSH 0.49 03/28/2022   HGBA1C 5.5 09/12/2022    CT Abdomen Pelvis W Contrast  Result Date: 07/14/2022 CLINICAL DATA:  Epigastric abdominal pain EXAM: CT ABDOMEN AND PELVIS WITH CONTRAST TECHNIQUE: Multidetector CT imaging of the abdomen and pelvis was performed using the standard protocol following bolus administration of intravenous contrast. RADIATION DOSE REDUCTION: This exam was performed according to the departmental dose-optimization program which includes automated exposure  control, adjustment of the mA and/or kV according to patient size and/or use of iterative reconstruction technique. CONTRAST:  142m OMNIPAQUE IOHEXOL 300 MG/ML  SOLN COMPARISON:  06/14/2020 FINDINGS: Lower chest: No acute abnormality. Hepatobiliary: Mild hepatic steatosis. Stable hypodensity within the left hepatic lobe medially likely representing a small hemangioma, stable since on a g of 05/22/2015. Mild focal fatty hepatic infiltration adjacent the falciform ligament. No enhancing intrahepatic mass. No intra or extrahepatic biliary ductal dilation. Gallbladder unremarkable. Pancreas: Unremarkable Spleen: Unremarkable Adrenals/Urinary Tract: Adrenal glands are unremarkable. Kidneys are normal, without renal calculi, focal lesion, or hydronephrosis. Bladder is mildly distended but is otherwise unremarkable. Stomach/Bowel: Stomach is within normal limits. Appendix appears normal. No evidence of bowel wall thickening, distention, or inflammatory changes. No free intraperitoneal gas or fluid. Vascular/Lymphatic: Aortic atherosclerosis. No enlarged abdominal or pelvic lymph nodes. Reproductive: Uterus and bilateral adnexa are unremarkable. Other: No abdominal wall hernia or abnormality. No abdominopelvic ascites. Musculoskeletal: Stable 7.4 cm right lateral abdominal wall hernia insinuating between the internal oblique and transverse abdominus musculature. No acute bone abnormality. No lytic or blastic bone lesion. Osseous structures are age-appropriate. IMPRESSION: 1. No acute intra-abdominal pathology identified. No definite radiographic explanation for the patient's reported symptoms. 2. Mild hepatic steatosis. 3. Stable 7.4 cm right lateral abdominal wall hernia insinuating between the internal oblique and transverse abdominus musculature. 4. Aortic atherosclerosis. Aortic Atherosclerosis (ICD10-I70.0). Electronically Signed   By: AFidela SalisburyM.D.   On: 07/14/2022 13:31       Assessment & Plan:   Hyperkalemia -     Potassium  Hyperglycemia Assessment & Plan: Sugar elevated on recent check.  Check A1c.   Orders: -     Hemoglobin A1c  Encounter for screening mammogram for malignant neoplasm of breast -     3D Screening Mammogram, Left and Right; Future  A[place] atherosclerosis [place] Assessment & Plan: Continue crestor.     Essential hypertension Assessment & Plan: Blood pressure improved. On avapro'150mg'$  q day.  Follow pressures.  Follow metabolic panel. Hold on making any changes today.   Orders: -     Basic metabolic panel; Future  Gastroesophageal reflux disease, unspecified whether esophagitis present Assessment & Plan: Previously saw Dr VMarius Ditch  CT - no acute abnormality.  Symptoms have improved.  Taking prilosec '40mg'$  q am. Not needing carafate regularly.  Overall doing much better. Follow.    History of colonic polyps Assessment & Plan: Colonoscopy 08/24/20 - recommended f/u in 10 years.    Hyperlipidemia, unspecified hyperlipidemia type Assessment & Plan:  On crestor.  Low cholesterol diet and exercise.  Follow lipid panel and liver function tests.  . Lab Results  Component Value Date   CHOL 181 09/08/2022   HDL 86.10 09/08/2022   LDLCALC 81 09/08/2022   LDLDIRECT 118.2 10/16/2006   TRIG 67.0 09/08/2022   CHOLHDL 2 09/08/2022    Orders: -     Hepatic function panel; Future -  Lipid panel; Future  Hypothyroidism, unspecified type Assessment & Plan: On thyroid replacement.  Follow tsh.    Sleeping difficulty Assessment & Plan: Taking 1/2 lunesta.  Follow.  Sleeping better now.    Stress Assessment & Plan: Has had increased stress with family medical issues.  Discussed.  Feels she is handling things well.   Overall appears to be doing much better. Notify me if feels needs any further intervention. Has good support.    Other orders -     Eszopiclone; TAKE ONE-HALF TABLET BY MOUTH IMMEDIATELY BEFORE BEDTIME  Dispense: 30 tablet; Refill:  1     Einar Pheasant, MD

## 2022-09-13 ENCOUNTER — Encounter: Payer: Self-pay | Admitting: Internal Medicine

## 2022-09-13 NOTE — Assessment & Plan Note (Signed)
On crestor.  Low cholesterol diet and exercise.  Follow lipid panel and liver function tests.  . Lab Results  Component Value Date   CHOL 181 09/08/2022   HDL 86.10 09/08/2022   LDLCALC 81 09/08/2022   LDLDIRECT 118.2 10/16/2006   TRIG 67.0 09/08/2022   CHOLHDL 2 09/08/2022

## 2022-09-13 NOTE — Assessment & Plan Note (Signed)
Has had increased stress with family medical issues.  Discussed.  Feels she is handling things well.   Overall appears to be doing much better. Notify me if feels needs any further intervention. Has good support.

## 2022-09-13 NOTE — Assessment & Plan Note (Signed)
Blood pressure improved. On avapro'150mg'$  q day.  Follow pressures.  Follow metabolic panel. Hold on making any changes today.

## 2022-09-13 NOTE — Assessment & Plan Note (Signed)
Colonoscopy 08/24/20 - recommended f/u in 10 years.

## 2022-09-13 NOTE — Assessment & Plan Note (Signed)
Taking 1/2 lunesta.  Follow.  Sleeping better now.

## 2022-09-13 NOTE — Assessment & Plan Note (Signed)
Sugar elevated on recent check.  Check A1c.

## 2022-09-13 NOTE — Assessment & Plan Note (Signed)
Previously saw Dr Marius Ditch.  CT - no acute abnormality.  Symptoms have improved.  Taking prilosec '40mg'$  q am. Not needing carafate regularly.  Overall doing much better. Follow.

## 2022-09-13 NOTE — Assessment & Plan Note (Signed)
Continue crestor 

## 2022-09-13 NOTE — Assessment & Plan Note (Signed)
On thyroid replacement.  Follow tsh.  

## 2022-09-21 DIAGNOSIS — L821 Other seborrheic keratosis: Secondary | ICD-10-CM | POA: Diagnosis not present

## 2022-09-21 DIAGNOSIS — L570 Actinic keratosis: Secondary | ICD-10-CM | POA: Diagnosis not present

## 2022-09-21 DIAGNOSIS — L814 Other melanin hyperpigmentation: Secondary | ICD-10-CM | POA: Diagnosis not present

## 2022-09-25 ENCOUNTER — Encounter: Payer: Self-pay | Admitting: Internal Medicine

## 2022-09-26 ENCOUNTER — Ambulatory Visit
Admission: EM | Admit: 2022-09-26 | Discharge: 2022-09-26 | Disposition: A | Discharge status: Home / Self Care | Payer: PPO | Attending: Urgent Care | Admitting: Urgent Care

## 2022-09-26 DIAGNOSIS — J0101 Acute recurrent maxillary sinusitis: Secondary | ICD-10-CM

## 2022-09-26 MED ORDER — PREDNISONE 20 MG PO TABS: ORAL_TABLET | ORAL | 0 refills | Status: AC

## 2022-09-26 MED ORDER — CLINDAMYCIN HCL 300 MG PO CAPS: 300.0000 mg | ORAL_CAPSULE | Freq: Three times a day (TID) | ORAL | 0 refills | Status: AC

## 2022-09-26 MED ORDER — CEFDINIR 300 MG PO CAPS: 300.0000 mg | ORAL_CAPSULE | Freq: Two times a day (BID) | ORAL | 0 refills | Status: AC

## 2022-09-26 NOTE — ED Provider Notes (Signed)
[facility]    CSN: [id number] Arrival date & time: 09/26/22  1038      History   Chief Complaint Chief Complaint  Patient presents with   Cough   Nasal Congestion   Joint Pain    HPI [name] [name] is a 72 y.o. female.   HPI Patient presents to urgent care for symptoms starting 5 days ago. Symptoms including cough, runny nose, sneezing, joint pain.  She is using nasal spray astelin and flonase, guaifenesin.  Patient states she was recently treated for bacterial sinusitis (about 3 weeks ago) with doxycycline (09/06/2022 per chart) and a course of prednisone (medrol dose-pak).  She states her symptoms improved but did not completely resolve.  She states after finishing these medications she had continued clear drainage which then developed again into green output last week.   Past Medical History:  Diagnosis Date   Adenomatous polyp 2005   Arrhythmia    H/O   Basal cell carcinoma    CTS (carpal tunnel syndrome)    Environmental allergies    History of chicken pox    HTN (hypertension)    Hypercholesterolemia    Hypothyroidism     Patient Active Problem List   Diagnosis Date Noted   Hyperglycemia 09/12/2022   Abdominal pain 06/28/2022   GERD (gastroesophageal reflux disease) 06/27/2022   Dyspepsia 04/05/2022   Carpal tunnel syndrome 03/15/2022   Stress 07/18/2021   Aortic atherosclerosis ([place]) 07/18/2021   History of COVID-19 03/16/2021   Eosinophilia 03/16/2021   COVID-19 virus infection 02/08/2021   Sinus congestion 07/12/2020   Hair thinning 07/25/2018   Sebaceous cyst 06/26/2017   Sinusitis 05/07/2017   Essential hypertension 11/09/2015   Dysphagia 06/28/2015   Alopecia 03/04/2015   Health care maintenance 10/26/2014   Sleeping difficulty 10/26/2014   Sleep disorder 10/26/2014   Elevated blood pressure 06/20/2014   Environmental allergies 11/03/2013   Basal cell carcinoma 11/03/2013   Vaginal dryness 11/03/2013   Hypothyroidism 10/30/2013    Hyperlipidemia 09/16/2013   History of colonic polyps 03/23/2010    Past Surgical History:  Procedure Laterality Date   BASAL CELL CARCINOMA EXCISION     right hip,abd, right arm, back neck   BREAST CYST ASPIRATION Left    negative   COLONOSCOPY WITH PROPOFOL N/A 08/24/2020   Procedure: COLONOSCOPY WITH PROPOFOL;  Surgeon: Lin Landsman, MD;  Location: [facility] ENDOSCOPY;  Service: Gastroenterology;  Laterality: N/A;   CTS releast Rt wrist     ESOPHAGOGASTRODUODENOSCOPY (EGD) WITH PROPOFOL N/A 04/22/2022   Procedure: ESOPHAGOGASTRODUODENOSCOPY (EGD) WITH PROPOFOL;  Surgeon: Lin Landsman, MD;  Location: [facility];  Service: Gastroenterology;  Laterality: N/A;   lipoma removal     Rt shoulder    MELANOMA EXCISION     right buttocks, top of scalp   POLYPECTOMY     + TA   TUBAL LIGATION  1982    OB History     Gravida  1   Para  1   Term      Preterm      AB      Living  1      SAB      IAB      Ectopic      Multiple      Live Births           Obstetric Comments  Menstrual age:16   Age 1st Pregnancy: 78           Home Medications  Prior to Admission medications   Medication Sig Start Date End Date Taking? Authorizing Provider  aluminum hydroxide-magnesium carbonate (GAVISCON) 95-358 MG/15ML SUSP Take by mouth as needed.    [provider]  azelastine (ASTELIN) 0.1 % nasal spray USE 1 SPRAY IN BOTH NOSTRILS DAILY AS NEEDED 09/06/22   Einar Pheasant, MD  benzonatate (TESSALON) 200 MG capsule Take 1 capsule (200 mg total) by mouth 3 (three) times daily as needed for cough. 09/06/22   Tomasita Morrow, NP  cholecalciferol (VITAMIN D) 1000 units tablet Take 1,000 Units by mouth daily.    [provider]  Cyanocobalamin (VITAMIN B-12 PO) Take by mouth.    [provider]  Eszopiclone 3 MG TABS TAKE ONE-HALF TABLET BY MOUTH IMMEDIATELY BEFORE BEDTIME 09/12/22   Einar Pheasant, MD  irbesartan (AVAPRO) 150 MG tablet Take 1  tablet (150 mg total) by mouth daily. 07/25/22   Einar Pheasant, MD  Nutritional Supplements (NUTRITIONAL SUPPLEMENT PO) Take by mouth. DoTERRA: ALPHA CRS+, xEO MEGA, MICROPLEX MVp, ON GUARD (protective blend)    [provider]  omeprazole (PRILOSEC) 40 MG capsule Take 1 capsule (40 mg total) by mouth 2 (two) times daily. 07/19/22 08/18/22  Lin Landsman, MD  ondansetron (ZOFRAN ODT) 4 MG disintegrating tablet Take 1 tablet (4 mg total) by mouth every 8 (eight) hours as needed for nausea or vomiting. 06/14/20   Naaman Plummer, MD  rosuvastatin (CRESTOR) 10 MG tablet TAKE 1 TABLET BY MOUTH DAILY 04/19/22   Dutch Quint B, FNP  sucralfate (CARAFATE) 1 GM/10ML suspension Take 10 mLs (1 g total) by mouth 4 (four) times daily -  with meals and at bedtime. 07/13/22   Einar Pheasant, MD  SYNTHROID 175 MCG tablet Take one tablet every morning with a glass of water 30 to 60 minutes before breakfast 04/05/22   Einar Pheasant, MD  VITAMIN D PO Take by mouth.    [provider]    Family History Family History  Problem Relation Age of Onset   Cervical cancer Mother    Arthritis Mother    Hypertension Mother    Sudden death Mother        cerbral hemorrage   Heart disease Father    Cancer Father        lung cancer   Diabetes Maternal Grandmother    Breast cancer Paternal Aunt 74   Colon cancer Neg Hx     Social History Social History   Tobacco Use   Smoking status: Former    Types: Cigarettes    Quit date: 07/16/1980    Years since quitting: 42.2   Smokeless tobacco: Never  Vaping Use   Vaping Use: Never used  Substance Use Topics   Alcohol use: Yes    Alcohol/week: 7.0 standard drinks of alcohol    Types: 7 Glasses of wine per week    Comment: 1 per day . NONE IN 4 WEEKS   Drug use: No     Allergies   Codeine and Penicillins   Review of Systems Review of Systems   Physical Exam Triage Vital Signs ED Triage Vitals  Enc Vitals Group     BP 09/26/22  1049 (!) 151/77     Pulse Rate 09/26/22 1049 89     Resp 09/26/22 1049 16     Temp 09/26/22 1049 98.1 F (36.7 C)     Temp Source 09/26/22 1049 Oral     SpO2 09/26/22 1049 98 %     Weight --  Height --      Head Circumference --      Peak Flow --      Pain Score 09/26/22 1048 0     Pain Loc --      Pain Edu? --      Excl. in [place]? --    No data found.  Updated Vital Signs BP (!) 151/77 (BP Location: Left Arm)   Pulse 89   Temp 98.1 F (36.7 C) (Oral)   Resp 16   SpO2 98%   Visual Acuity Right Eye Distance:   Left Eye Distance:   Bilateral Distance:    Right Eye Near:   Left Eye Near:    Bilateral Near:     Physical Exam Vitals reviewed.  Constitutional:      Appearance: Normal appearance.  HENT:     Right Ear: Tympanic membrane normal.     Left Ear: Tympanic membrane normal.     Mouth/Throat:     Pharynx: No oropharyngeal exudate or posterior oropharyngeal erythema.  Cardiovascular:     Rate and Rhythm: Normal rate and regular rhythm.     Pulses: Normal pulses.     Heart sounds: Normal heart sounds.  Pulmonary:     Effort: Pulmonary effort is normal.     Breath sounds: Normal breath sounds.  Skin:    General: Skin is warm and dry.  Neurological:     General: No focal deficit present.     Mental Status: She is alert and oriented to person, place, and time.  Psychiatric:        Mood and Affect: Mood normal.        Behavior: Behavior normal.      UC Treatments / Results  Labs (all labs ordered are listed, but only abnormal results are displayed) Labs Reviewed - No data to display  EKG   Radiology No results found.  Procedures Procedures (including critical care time)  Medications Ordered in UC Medications - No data to display  Initial Impression / Assessment and Plan / UC Course  I have reviewed the triage vital signs and the nursing notes.  Pertinent labs & imaging results that were available during my care of the patient were  reviewed by me and considered in my medical decision making (see chart for details).   Patient is afebrile here without recent antipyretics. Satting well on room air. Overall is ill appearing, well hydrated, without respiratory distress. Pulmonary exam is unremarkable.  Lungs CTAB without wheezing, rhonchi, rales.  TMs are WNL.  No pharyngeal erythema or peritonsillar exudates.  New viral sinusitis versus recurrent bacterial sinusitis.  Given timing of her symptoms, I am inclined to consider bacterial involvement inadequately treated by doxycycline.  She states penicillin intolerance though she thinks she has used Augmentin successfully in the past.  She endorses developing "fine rash" when she uses penicillin.  Am not willing to take the chance with Augmentin.  She endorses tolerance of cephalosporins so will prescribe cefdinir along with clindamycin given risk of strep pneumoniae given her age, with another course of anti-inflammatory prednisone.  Final Clinical Impressions(s) / UC Diagnoses   Final diagnoses:  None   Discharge Instructions   None    ED Prescriptions   None    PDMP not reviewed this encounter.   Rose Phi, [place] 09/26/22 1117

## 2022-09-26 NOTE — Telephone Encounter (Signed)
I can work her in tomorrow at 11:30

## 2022-09-26 NOTE — Discharge Instructions (Signed)
Follow up here or with your primary care provider if your symptoms are worsening or not improving with treatment.     

## 2022-09-26 NOTE — Telephone Encounter (Signed)
Pt went to urgent care because there was no wait and was given two abx and prednisone taper. Will f/u if no improvement.

## 2022-09-26 NOTE — ED Triage Notes (Signed)
Patient presents to UC for cough, runny nose, sneezing, and joint pain since nasal spray, guanfacine cough med.

## 2022-10-16 ENCOUNTER — Encounter: Payer: Self-pay | Admitting: Internal Medicine

## 2022-10-17 NOTE — Progress Notes (Unsigned)
MyChart Video Visit    Virtual Visit via Video Note   This visit type was conducted because this format is felt to be most appropriate for this patient at this time. Physical exam was limited by quality of the video and audio technology used for the visit. CMA was able to get the patient set up on a video visit.  Patient location: Home. Patient and provider in visit Provider location: Office  I discussed the limitations of evaluation and management by telemedicine and the availability of in person appointments. The patient expressed understanding and agreed to proceed.  Visit Date: 10/18/2022  Today's healthcare provider: Tomasita Morrow, NP     Subjective:    Patient ID: [name] [name], female    DOB: Dec 19, 1950, 72 y.o.   MRN: [id number]  No chief complaint on file.   HPI  Respiratory illness:  Cough- ***  Congestion- ***   Sinus- ***   Chest- ***  Post nasal drip- ***  Sore throat- ***  Shortness of breath- ***  Fever- ***  Fatigue/Myalgia- *** Headache- *** Nausea/Vomiting- *** Taste disturbance- ***  Smell disturbance- ***  Covid exposure- ***  Covid vaccination- ***  Flu vaccination- ***  Medications- ***   Past Medical History:  Diagnosis Date   Adenomatous polyp 2005   Arrhythmia    H/O   Basal cell carcinoma    CTS (carpal tunnel syndrome)    Environmental allergies    History of chicken pox    HTN (hypertension)    Hypercholesterolemia    Hypothyroidism     Past Surgical History:  Procedure Laterality Date   BASAL CELL CARCINOMA EXCISION     right hip,abd, right arm, back neck   BREAST CYST ASPIRATION Left    negative   COLONOSCOPY WITH PROPOFOL N/A 08/24/2020   Procedure: COLONOSCOPY WITH PROPOFOL;  Surgeon: Lin Landsman, MD;  Location: [facility] ENDOSCOPY;  Service: Gastroenterology;  Laterality: N/A;   CTS releast Rt wrist     ESOPHAGOGASTRODUODENOSCOPY (EGD) WITH PROPOFOL N/A 04/22/2022   Procedure: ESOPHAGOGASTRODUODENOSCOPY (EGD)  WITH PROPOFOL;  Surgeon: Lin Landsman, MD;  Location: [place];  Service: Gastroenterology;  Laterality: N/A;   lipoma removal     Rt shoulder    MELANOMA EXCISION     right buttocks, top of scalp   POLYPECTOMY     + TA   TUBAL LIGATION  1982    Family History  Problem Relation Age of Onset   Cervical cancer Mother    Arthritis Mother    Hypertension Mother    Sudden death Mother        cerbral hemorrage   Heart disease Father    Cancer Father        lung cancer   Diabetes Maternal Grandmother    Breast cancer Paternal Aunt 75   Colon cancer Neg Hx     Social History   Socioeconomic History   Marital status: Widowed    Spouse name: Not on file   Number of children: 1   Years of education: Not on file   Highest education level: Not on file  Occupational History   Occupation: ScheduliOfficeng/ Ortho     Employer: [employer], [employer]  Tobacco Use   Smoking status: Former    Types: Cigarettes    Quit date: 07/16/1980    Years since quitting: 42.2   Smokeless tobacco: Never  Vaping Use   Vaping Use: Never used  Substance and Sexual Activity   Alcohol  use: Yes    Alcohol/week: 7.0 standard drinks of alcohol    Types: 7 Glasses of wine per week    Comment: 1 per day . NONE IN 4 WEEKS   Drug use: No   Sexual activity: Never  Other Topics Concern   Not on file  Social History Narrative   Widowed; 1 son.    Retired Art therapist   Daily caffeine use 2-3/day       Cell # [id number]   Social Determinants of Health   Financial Resource Strain: Low Risk  (02/23/2022)   Overall Financial Resource Strain (CARDIA)    Difficulty of Paying Living Expenses: Not hard at all  Food Insecurity: No Food Insecurity (02/23/2022)   Hunger Vital Sign    Worried About Running Out of Food in the Last Year: Never true    [place] in the Last Year: Never true  Transportation Needs: No Transportation Needs (02/23/2022)   PRAPARE - Radiographer, therapeutic (Medical): No    Lack of Transportation (Non-Medical): No  Physical Activity: Sufficiently Active (02/23/2022)   Exercise Vital Sign    Days of Exercise per Week: 7 days    Minutes of Exercise per Session: 60 min  Stress: No Stress Concern Present (02/23/2022)   [place]    Feeling of Stress : Not at all  Social Connections: Unknown (02/23/2022)   Social Connection and Isolation Panel [NHANES]    Frequency of Communication with Friends and Family: More than three times a week    Frequency of Social Gatherings with Friends and Family: More than three times a week    Attends Religious Services: Not on file    Active Member of Clubs or Organizations: Not on file    Attends Archivist Meetings: Not on file    Marital Status: Not on file  Intimate Partner Violence: Not At Risk (02/23/2022)   Humiliation, Afraid, Rape, and Kick questionnaire    Fear of Current or Ex-Partner: No    Emotionally Abused: No    Physically Abused: No    Sexually Abused: No    Outpatient Medications Prior to Visit  Medication Sig Dispense Refill   aluminum hydroxide-magnesium carbonate (GAVISCON) 95-358 MG/15ML SUSP Take by mouth as needed.     azelastine (ASTELIN) 0.1 % nasal spray USE 1 SPRAY IN BOTH NOSTRILS DAILY AS NEEDED 30 mL 0   benzonatate (TESSALON) 200 MG capsule Take 1 capsule (200 mg total) by mouth 3 (three) times daily as needed for cough. 30 capsule 0   cholecalciferol (VITAMIN D) 1000 units tablet Take 1,000 Units by mouth daily.     Cyanocobalamin (VITAMIN B-12 PO) Take by mouth.     Eszopiclone 3 MG TABS TAKE ONE-HALF TABLET BY MOUTH IMMEDIATELY BEFORE BEDTIME 30 tablet 1   irbesartan (AVAPRO) 150 MG tablet Take 1 tablet (150 mg total) by mouth daily. 30 tablet 3   Nutritional Supplements (NUTRITIONAL SUPPLEMENT PO) Take by mouth. DoTERRA: ALPHA CRS+, xEO MEGA, MICROPLEX MVp, ON GUARD (protective blend)      omeprazole (PRILOSEC) 40 MG capsule Take 1 capsule (40 mg total) by mouth 2 (two) times daily. 60 capsule 2   ondansetron (ZOFRAN ODT) 4 MG disintegrating tablet Take 1 tablet (4 mg total) by mouth every 8 (eight) hours as needed for nausea or vomiting. 20 tablet 0   rosuvastatin (CRESTOR) 10 MG tablet TAKE 1 TABLET BY MOUTH DAILY  90 tablet 1   sucralfate (CARAFATE) 1 GM/10ML suspension Take 10 mLs (1 g total) by mouth 4 (four) times daily -  with meals and at bedtime. 420 mL 0   SYNTHROID 175 MCG tablet Take one tablet every morning with a glass of water 30 to 60 minutes before breakfast 90 tablet 2   VITAMIN D PO Take by mouth.     No facility-administered medications prior to visit.    Allergies  Allergen Reactions   Codeine     NAUSEA   Penicillins Hives    ROS     Objective:    Physical Exam  There were no vitals taken for this visit. Wt Readings from Last 3 Encounters:  09/12/22 146 lb 3.2 oz (66.3 kg)  09/06/22 146 lb 6.4 oz (66.4 kg)  07/21/22 145 lb (65.8 kg)   GENERAL: alert, oriented, appears well and in no acute distress   HEENT: atraumatic, conjunttiva clear, no obvious abnormalities on inspection of external nose and ears   NECK: normal movements of the head and neck   LUNGS: on inspection no signs of respiratory distress, breathing rate appears normal, no obvious gross SOB, gasping or wheezing   CV: no obvious cyanosis   MS: moves all visible extremities without noticeable abnormality   PSYCH/NEURO: pleasant and cooperative, no obvious depression or anxiety, speech and thought processing grossly intact    Assessment & Plan:   Problem List Items Addressed This Visit   None   I am having [name] [name] maintain her Nutritional Supplements (NUTRITIONAL SUPPLEMENT PO), cholecalciferol, ondansetron, VITAMIN D PO, Cyanocobalamin (VITAMIN B-12 PO), Synthroid, rosuvastatin, aluminum hydroxide-magnesium carbonate, sucralfate, omeprazole, irbesartan,  benzonatate, azelastine, and Eszopiclone.  No orders of the defined types were placed in this encounter.   I discussed the assessment and treatment plan with the patient. The patient was provided an opportunity to ask questions and all were answered. The patient agreed with the plan and demonstrated an understanding of the instructions.   The patient was advised to call back or seek an in-person evaluation if the symptoms worsen or if the condition fails to improve as anticipated.   Tomasita Morrow, NP [place] at [facility] [phone] (phone) [phone] (fax)  [place]

## 2022-10-18 ENCOUNTER — Encounter: Payer: Self-pay | Admitting: Nurse Practitioner

## 2022-10-18 ENCOUNTER — Telehealth (INDEPENDENT_AMBULATORY_CARE_PROVIDER_SITE_OTHER): Payer: PPO | Admitting: Nurse Practitioner

## 2022-10-18 VITALS — BP 153/70 | Ht 62.0 in | Wt 144.3 lb

## 2022-10-18 DIAGNOSIS — R0981 Nasal congestion: Secondary | ICD-10-CM

## 2022-10-18 NOTE — Assessment & Plan Note (Signed)
Hx of chronic sinusitis. Has been treated with multiple rounds of antibiotics and steroids over the last 6 weeks without full relief of symptoms. She does not want any more antibiotics as she feels like they are not helping. She has an appointment with ENT next week. Advised adding daily antihistamine such as Zyrtec and continuing Flonase nasal spray daily. She will continue OTC Mucinex BID. Encouraged adequate fluid intake.

## 2022-10-19 NOTE — Addendum Note (Signed)
Addended by: Tomasita Morrow on: 10/19/2022 10:48 AM   Modules accepted: Level of Service

## 2022-10-24 DIAGNOSIS — J329 Chronic sinusitis, unspecified: Secondary | ICD-10-CM | POA: Diagnosis not present

## 2022-10-24 DIAGNOSIS — R42 Dizziness and giddiness: Secondary | ICD-10-CM | POA: Diagnosis not present

## 2022-10-31 DIAGNOSIS — J329 Chronic sinusitis, unspecified: Secondary | ICD-10-CM | POA: Diagnosis not present

## 2022-11-04 ENCOUNTER — Encounter: Payer: Self-pay | Admitting: Internal Medicine

## 2022-11-04 MED ORDER — ESZOPICLONE 3 MG PO TABS: ORAL_TABLET | ORAL | 1 refills | Status: DC

## 2022-11-04 NOTE — Telephone Encounter (Signed)
This was last refilled on 09/12/22.

## 2022-11-04 NOTE — Telephone Encounter (Signed)
Rx sent in for refill lunesta - 1/2 - 1 tablet qhs

## 2022-11-18 ENCOUNTER — Other Ambulatory Visit: Payer: Self-pay | Admitting: Internal Medicine

## 2022-11-23 ENCOUNTER — Telehealth: Payer: Self-pay

## 2022-11-23 NOTE — Telephone Encounter (Signed)
Patient has canceled her esophageal manometry. She was scheduled for 11/30/22. She is not rescheduled.

## 2022-11-23 NOTE — Progress Notes (Signed)
Patient called and stated wanted to cancel mano for next Wednesday. Nurse told patient to call the office to let you know.

## 2022-11-24 NOTE — Telephone Encounter (Signed)
Please check with patient why she canceled her esophageal manometry  RV

## 2022-11-25 ENCOUNTER — Other Ambulatory Visit: Payer: Self-pay | Admitting: Internal Medicine

## 2022-11-25 NOTE — Telephone Encounter (Signed)
Called and left a message for call back. Sent mychart message as well.

## 2022-11-30 ENCOUNTER — Encounter (HOSPITAL_COMMUNITY): Admission: RE | Payer: Self-pay | Source: Home / Self Care

## 2022-11-30 ENCOUNTER — Ambulatory Visit (HOSPITAL_COMMUNITY): Admission: RE | Admit: 2022-11-30 | Payer: PPO | Source: Home / Self Care | Admitting: Gastroenterology

## 2022-11-30 SURGERY — MANOMETRY, ESOPHAGUS
Anesthesia: Choice

## 2022-12-19 ENCOUNTER — Other Ambulatory Visit: Payer: Self-pay | Admitting: Gastroenterology

## 2022-12-19 NOTE — Telephone Encounter (Signed)
Last office visit 07/19/2022 chronic gerd

## 2022-12-29 ENCOUNTER — Ambulatory Visit
Admission: RE | Admit: 2022-12-29 | Discharge: 2022-12-29 | Disposition: A | Discharge status: Home / Self Care | Payer: PPO | Source: Ambulatory Visit | Attending: Internal Medicine | Admitting: Internal Medicine

## 2022-12-29 DIAGNOSIS — Z1231 Encounter for screening mammogram for malignant neoplasm of breast: Secondary | ICD-10-CM | POA: Diagnosis not present

## 2023-01-06 ENCOUNTER — Other Ambulatory Visit (INDEPENDENT_AMBULATORY_CARE_PROVIDER_SITE_OTHER): Payer: PPO

## 2023-01-06 DIAGNOSIS — E875 Hyperkalemia: Secondary | ICD-10-CM

## 2023-01-06 DIAGNOSIS — I10 Essential (primary) hypertension: Secondary | ICD-10-CM

## 2023-01-06 DIAGNOSIS — E785 Hyperlipidemia, unspecified: Secondary | ICD-10-CM

## 2023-01-06 LAB — LIPID PANEL
Cholesterol: 158 mg/dL (ref 0–200)
HDL: 75.7 mg/dL (ref >39.00)
LDL Cholesterol: 67 mg/dL (ref 0–99)
NonHDL: 81.95
Total CHOL/HDL Ratio: 2
Triglycerides: 74 mg/dL (ref 0.0–149.0)
VLDL: 14.8 mg/dL (ref 0.0–40.0)

## 2023-01-06 LAB — HEPATIC FUNCTION PANEL
ALT: 27 U/L (ref 0–35)
AST: 22 U/L (ref 0–37)
Albumin: 4 g/dL (ref 3.5–5.2)
Alkaline Phosphatase: 72 U/L (ref 39–117)
Bilirubin, Direct: 0.1 mg/dL (ref 0.0–0.3)
Total Bilirubin: 0.4 mg/dL (ref 0.2–1.2)
Total Protein: 6 g/dL (ref 6.0–8.3)

## 2023-01-06 LAB — BASIC METABOLIC PANEL
BUN: 31 mg/dL — ABNORMAL HIGH (ref 6–23)
CO2: 28 mEq/L (ref 19–32)
Calcium: 9 mg/dL (ref 8.4–10.5)
Chloride: 106 mEq/L (ref 96–112)
Creatinine, Ser: 0.7 mg/dL (ref 0.40–1.20)
GFR: 85.72 mL/min (ref >60.00)
Glucose, Bld: 94 mg/dL (ref 70–99)
Potassium: 4.6 mEq/L (ref 3.5–5.1)
Sodium: 142 mEq/L (ref 135–145)

## 2023-01-06 NOTE — Addendum Note (Signed)
Addended by: Jarvis Morgan D on: 01/06/2023 07:26 AM   Modules accepted: Orders

## 2023-01-11 ENCOUNTER — Ambulatory Visit (INDEPENDENT_AMBULATORY_CARE_PROVIDER_SITE_OTHER): Payer: PPO | Admitting: Internal Medicine

## 2023-01-11 ENCOUNTER — Encounter: Payer: Self-pay | Admitting: Internal Medicine

## 2023-01-11 VITALS — BP 122/70 | HR 61 | Temp 97.9°F | Resp 16 | Ht 62.0 in | Wt 147.0 lb

## 2023-01-11 DIAGNOSIS — Z86010 Personal history of colonic polyps: Secondary | ICD-10-CM

## 2023-01-11 DIAGNOSIS — I10 Essential (primary) hypertension: Secondary | ICD-10-CM | POA: Diagnosis not present

## 2023-01-11 DIAGNOSIS — F439 Reaction to severe stress, unspecified: Secondary | ICD-10-CM | POA: Diagnosis not present

## 2023-01-11 DIAGNOSIS — E785 Hyperlipidemia, unspecified: Secondary | ICD-10-CM

## 2023-01-11 DIAGNOSIS — K219 Gastro-esophageal reflux disease without esophagitis: Secondary | ICD-10-CM | POA: Diagnosis not present

## 2023-01-11 DIAGNOSIS — I700 Atherosclerosis of aorta: Secondary | ICD-10-CM | POA: Diagnosis not present

## 2023-01-11 DIAGNOSIS — E039 Hypothyroidism, unspecified: Secondary | ICD-10-CM | POA: Diagnosis not present

## 2023-01-11 DIAGNOSIS — Z0000 Encounter for general adult medical examination without abnormal findings: Secondary | ICD-10-CM | POA: Diagnosis not present

## 2023-01-11 MED ORDER — ROSUVASTATIN CALCIUM 10 MG PO TABS: 10.0000 mg | ORAL_TABLET | Freq: Every day | ORAL | 3 refills | Status: DC

## 2023-01-11 MED ORDER — SYNTHROID 175 MCG PO TABS: ORAL_TABLET | ORAL | 2 refills | Status: DC

## 2023-01-11 NOTE — Progress Notes (Signed)
Subjective:    Patient ID: [name] [name], female    DOB: 12/18/1950, 72 y.o.   MRN: [id number]  Patient here for  Chief Complaint  Patient presents with   Annual Exam    HPI Here for physical exam.  She is doing well.  Increased stress with her father-n-law (health), but overall appears to be handling things well.  Stays active.  No chest pain or sob reported.  No cough or congestion.  No abdominal pain or bowel change reported.  Recent fall - slid down stairs.  No residual pain from the fall.     Past Medical History:  Diagnosis Date   Adenomatous polyp 2005   Arrhythmia    H/O   Basal cell carcinoma    CTS (carpal tunnel syndrome)    Environmental allergies    History of chicken pox    HTN (hypertension)    Hypercholesterolemia    Hypothyroidism    Past Surgical History:  Procedure Laterality Date   BASAL CELL CARCINOMA EXCISION     right hip,abd, right arm, back neck   BREAST CYST ASPIRATION Left    negative   COLONOSCOPY WITH PROPOFOL N/A 08/24/2020   Procedure: COLONOSCOPY WITH PROPOFOL;  Surgeon: Toney Reil, MD;  Location: [facility] ENDOSCOPY;  Service: Gastroenterology;  Laterality: N/A;   CTS releast Rt wrist     ESOPHAGOGASTRODUODENOSCOPY (EGD) WITH PROPOFOL N/A 04/22/2022   Procedure: ESOPHAGOGASTRODUODENOSCOPY (EGD) WITH PROPOFOL;  Surgeon: Toney Reil, MD;  Location: [facility] ENDOSCOPY;  Service: Gastroenterology;  Laterality: N/A;   lipoma removal     Rt shoulder    MELANOMA EXCISION     right buttocks, top of scalp   POLYPECTOMY     + TA   TUBAL LIGATION  1982   Family History  Problem Relation Age of Onset   Cervical cancer Mother    Arthritis Mother    Hypertension Mother    Sudden death Mother        cerbral hemorrage   Heart disease Father    Cancer Father        lung cancer   Diabetes Maternal Grandmother    Breast cancer Paternal Aunt 67   Colon cancer Neg Hx    Social History   Socioeconomic History   Marital status: Widowed     Spouse name: Not on file   Number of children: 1   Years of education: Not on file   Highest education level: Not on file  Occupational History   Occupation: ScheduliOfficeng/ Ortho     Employer: [employer], [employer]  Tobacco Use   Smoking status: Former    Types: Cigarettes    Quit date: 07/16/1980    Years since quitting: 42.5   Smokeless tobacco: Never  Vaping Use   Vaping Use: Never used  Substance and Sexual Activity   Alcohol use: Yes    Alcohol/week: 7.0 standard drinks of alcohol    Types: 7 Glasses of wine per week    Comment: 1 per day . NONE IN 4 WEEKS   Drug use: No   Sexual activity: Never  Other Topics Concern   Not on file  Social History Narrative   Widowed; 1 son.    Retired Sales executive   Daily caffeine use 2-3/day       Cell # [id number]   Social Determinants of Health   Financial Resource Strain: Low Risk  (02/23/2022)   Overall Financial Resource Strain (CARDIA)    Difficulty of  Paying Living Expenses: Not hard at all  Food Insecurity: No Food Insecurity (02/23/2022)   Hunger Vital Sign    Worried About Running Out of Food in the Last Year: Never true    Ran Out of Food in the Last Year: Never true  Transportation Needs: No Transportation Needs (02/23/2022)   PRAPARE - Administrator, Civil Service (Medical): No    Lack of Transportation (Non-Medical): No  Physical Activity: Sufficiently Active (02/23/2022)   Exercise Vital Sign    Days of Exercise per Week: 7 days    Minutes of Exercise per Session: 60 min  Stress: No Stress Concern Present (02/23/2022)   [employer] of Occupational Health - Occupational Stress Questionnaire    Feeling of Stress : Not at all  Social Connections: Unknown (02/23/2022)   Social Connection and Isolation Panel [NHANES]    Frequency of Communication with Friends and Family: More than three times a week    Frequency of Social Gatherings with Friends and Family: More than three times a week    Attends  Religious Services: Not on Marketing executive or Organizations: Not on file    Attends Banker Meetings: Not on file    Marital Status: Not on file     Review of Systems  Constitutional:  Negative for appetite change and unexpected weight change.  HENT:  Negative for congestion, sinus pressure and sore throat.   Eyes:  Negative for pain and visual disturbance.  Respiratory:  Negative for cough, chest tightness and shortness of breath.   Cardiovascular:  Negative for chest pain and palpitations.  Gastrointestinal:  Negative for abdominal pain, diarrhea, nausea and vomiting.  Genitourinary:  Negative for difficulty urinating and dysuria.  Musculoskeletal:  Negative for joint swelling and myalgias.  Skin:  Negative for color change and rash.  Neurological:  Negative for dizziness and headaches.  Hematological:  Negative for adenopathy. Does not bruise/bleed easily.  Psychiatric/Behavioral:  Negative for agitation and dysphoric mood.        Objective:     BP 122/70   Pulse 61   Temp 97.9 F (36.6 C)   Resp 16   Ht 5\' 2"  (1.575 m)   Wt 147 lb (66.7 kg)   SpO2 99%   BMI 26.89 kg/m  Wt Readings from Last 3 Encounters:  01/11/23 147 lb (66.7 kg)  10/18/22 144 lb 4.8 oz (65.5 kg)  09/12/22 146 lb 3.2 oz (66.3 kg)    Physical Exam Vitals reviewed.  Constitutional:      General: She is not in acute distress.    Appearance: Normal appearance. She is well-developed.  HENT:     Head: Normocephalic and atraumatic.     Right Ear: External ear normal.     Left Ear: External ear normal.  Eyes:     General: No scleral icterus.       Right eye: No discharge.        Left eye: No discharge.     Conjunctiva/sclera: Conjunctivae normal.  Neck:     Thyroid: No thyromegaly.  Cardiovascular:     Rate and Rhythm: Normal rate and regular rhythm.  Pulmonary:     Effort: No tachypnea, accessory muscle usage or respiratory distress.     Breath sounds: Normal  breath sounds. No decreased breath sounds or wheezing.  Chest:  Breasts:    Right: No inverted nipple, mass, nipple discharge or tenderness (no axillary adenopathy).  Left: No inverted nipple, mass, nipple discharge or tenderness (no axilarry adenopathy).  Abdominal:     General: Bowel sounds are normal.     Palpations: Abdomen is soft.     Tenderness: There is no abdominal tenderness.  Musculoskeletal:        General: No swelling or tenderness.     Cervical back: Neck supple. No tenderness.  Lymphadenopathy:     Cervical: No cervical adenopathy.  Skin:    Findings: No erythema or rash.  Neurological:     Mental Status: She is alert and oriented to person, place, and time.  Psychiatric:        Mood and Affect: Mood normal.        Behavior: Behavior normal.      Outpatient Encounter Medications as of 01/11/2023  Medication Sig   azelastine (ASTELIN) 0.1 % nasal spray USE 1 SPRAY IN BOTH NOSTRILS DAILY AS NEEDED   cholecalciferol (VITAMIN D) 1000 units tablet Take 1,000 Units by mouth daily. (Patient not taking: Reported on 10/18/2022)   Cyanocobalamin (VITAMIN B-12 PO) Take by mouth. (Patient not taking: Reported on 10/18/2022)   Eszopiclone 3 MG TABS TAKE ONE-HALF - ONE TABLET BY MOUTH IMMEDIATELY BEFORE BEDTIME   irbesartan (AVAPRO) 150 MG tablet TAKE ONE TABLET (150 MG) BY MOUTH EVERY DAY   Nutritional Supplements (NUTRITIONAL SUPPLEMENT PO) Take by mouth. DoTERRA: ALPHA CRS+, xEO MEGA, MICROPLEX MVp, ON GUARD (protective blend)   omeprazole (PRILOSEC) 40 MG capsule Take 1 capsule (40 mg total) by mouth 2 (two) times daily.   ondansetron (ZOFRAN ODT) 4 MG disintegrating tablet Take 1 tablet (4 mg total) by mouth every 8 (eight) hours as needed for nausea or vomiting.   rosuvastatin (CRESTOR) 10 MG tablet Take 1 tablet (10 mg total) by mouth daily.   sucralfate (CARAFATE) 1 GM/10ML suspension Take 10 mLs (1 g total) by mouth 4 (four) times daily -  with meals and at bedtime.    SYNTHROID 175 MCG tablet Take one tablet every morning with a glass of water 30 to 60 minutes before breakfast   VITAMIN D PO Take by mouth.   [DISCONTINUED] aluminum hydroxide-magnesium carbonate (GAVISCON) 95-358 MG/15ML SUSP Take by mouth as needed.   [DISCONTINUED] benzonatate (TESSALON) 200 MG capsule Take 1 capsule (200 mg total) by mouth 3 (three) times daily as needed for cough. (Patient not taking: Reported on 10/18/2022)   [DISCONTINUED] rosuvastatin (CRESTOR) 10 MG tablet TAKE 1 TABLET BY MOUTH DAILY   [DISCONTINUED] SYNTHROID 175 MCG tablet Take one tablet every morning with a glass of water 30 to 60 minutes before breakfast   No facility-administered encounter medications on file as of 01/11/2023.     Lab Results  Component Value Date   WBC 6.0 06/27/2022   HGB 13.5 06/27/2022   HCT 40.0 06/27/2022   PLT 250.0 06/27/2022   GLUCOSE 94 01/06/2023   CHOL 158 01/06/2023   TRIG 74.0 01/06/2023   HDL 75.70 01/06/2023   LDLDIRECT 118.2 10/16/2006   LDLCALC 67 01/06/2023   ALT 27 01/06/2023   AST 22 01/06/2023   NA 142 01/06/2023   K 4.6 01/06/2023   CL 106 01/06/2023   CREATININE 0.70 01/06/2023   BUN 31 (H) 01/06/2023   CO2 28 01/06/2023   TSH 0.49 03/28/2022   HGBA1C 5.5 09/12/2022    MM 3D SCREEN BREAST BILATERAL  Result Date: 12/30/2022 CLINICAL DATA:  Screening. EXAM: DIGITAL SCREENING BILATERAL MAMMOGRAM WITH TOMOSYNTHESIS AND CAD TECHNIQUE: Bilateral screening digital craniocaudal and  mediolateral oblique mammograms were obtained. Bilateral screening digital breast tomosynthesis was performed. The images were evaluated with computer-aided detection. COMPARISON:  Previous exam(s). ACR Breast Density Category c: The breasts are heterogeneously dense, which may obscure small masses. FINDINGS: There are no findings suspicious for malignancy. IMPRESSION: No mammographic evidence of malignancy. A result letter of this screening mammogram will be mailed directly to the patient.  RECOMMENDATION: Screening mammogram in one year. (Code:SM-B-01Y) BI-RADS CATEGORY  1: Negative. Electronically Signed   By: Meda Klinefelter M.D.   On: 12/30/2022 11:44       Assessment & Plan:  Hyperlipidemia, unspecified hyperlipidemia type Assessment & Plan:  On crestor.  Low cholesterol diet and exercise.  Follow lipid panel and liver function tests.  . Lab Results  Component Value Date   CHOL 158 01/06/2023   HDL 75.70 01/06/2023   LDLCALC 67 01/06/2023   LDLDIRECT 118.2 10/16/2006   TRIG 74.0 01/06/2023   CHOLHDL 2 01/06/2023    Orders: -     Lipid panel; Future -     Hepatic function panel; Future -     Basic metabolic panel; Future -     TSH; Future  Aortic atherosclerosis (HCC) Assessment & Plan: Continue crestor.     Essential hypertension Assessment & Plan: Blood pressure as outlined. On avapro150mg  q day.  Follow pressures.  Follow metabolic panel.    Gastroesophageal reflux disease, unspecified whether esophagitis present Assessment & Plan: Controlled on current medication regimen.    Health care maintenance Assessment & Plan: Physical today 11/29/21.  Colonoscopy 08/2020 - non bleeding external hemorrhoids.  Recommended f/u in 10 years.  Mammogram 12/29/22 - birads I.    History of colonic polyps Assessment & Plan: Colonoscopy 08/24/20 - recommended f/u in 10 years.    Hypothyroidism, unspecified type Assessment & Plan: On thyroid replacement.  Follow tsh.    Stress Assessment & Plan: Has had increased stress with family medical issues.  Discussed.  Feels she is handling things well.   Overall appears to be doing much better. Notify me if feels needs any further intervention. Has good support.    Other orders -     Rosuvastatin Calcium; Take 1 tablet (10 mg total) by mouth daily.  Dispense: 90 tablet; Refill: 3 -     Synthroid; Take one tablet every morning with a glass of water 30 to 60 minutes before breakfast  Dispense: 90 tablet; Refill:  2     Dale Ray, MD

## 2023-01-15 ENCOUNTER — Encounter: Payer: Self-pay | Admitting: Internal Medicine

## 2023-01-15 NOTE — Assessment & Plan Note (Signed)
On thyroid replacement.  Follow tsh.  

## 2023-01-15 NOTE — Assessment & Plan Note (Signed)
Has had increased stress with family medical issues.  Discussed.  Feels she is handling things well.   Overall appears to be doing much better. Notify me if feels needs any further intervention. Has good support.  

## 2023-01-15 NOTE — Assessment & Plan Note (Signed)
Physical today 11/29/21.  Colonoscopy 08/2020 - non bleeding external hemorrhoids.  Recommended f/u in 10 years.  Mammogram 12/29/22 - birads I.

## 2023-01-15 NOTE — Assessment & Plan Note (Signed)
Colonoscopy 08/24/20 - recommended f/u in 10 years.  

## 2023-01-15 NOTE — Assessment & Plan Note (Signed)
Controlled on current medication regimen.

## 2023-01-15 NOTE — Assessment & Plan Note (Signed)
On crestor.  Low cholesterol diet and exercise.  Follow lipid panel and liver function tests.  . Lab Results  Component Value Date   CHOL 158 01/06/2023   HDL 75.70 01/06/2023   LDLCALC 67 01/06/2023   LDLDIRECT 118.2 10/16/2006   TRIG 74.0 01/06/2023   CHOLHDL 2 01/06/2023

## 2023-01-15 NOTE — Assessment & Plan Note (Signed)
Blood pressure as outlined. On avapro150mg  q day.  Follow pressures.  Follow metabolic panel.

## 2023-01-15 NOTE — Assessment & Plan Note (Signed)
Continue crestor 

## 2023-02-21 ENCOUNTER — Telehealth: Payer: Self-pay | Admitting: Internal Medicine

## 2023-02-21 NOTE — Telephone Encounter (Signed)
Copied from CRM [id number]. Topic: Medicare AWV >> Feb 21, 2023  2:23 PM Payton Doughty wrote: Reason for CRM: LM 02/21/2023 to schedule AWV   Verlee Rossetti; Care Guide Ambulatory Clinical Support [place] [facility] Direct Dial: [phone]

## 2023-02-28 ENCOUNTER — Other Ambulatory Visit: Payer: Self-pay | Admitting: Gastroenterology

## 2023-03-24 ENCOUNTER — Other Ambulatory Visit: Payer: Self-pay | Admitting: Internal Medicine

## 2023-04-11 ENCOUNTER — Ambulatory Visit (INDEPENDENT_AMBULATORY_CARE_PROVIDER_SITE_OTHER): Payer: PPO | Admitting: *Deleted

## 2023-04-11 VITALS — Ht 62.0 in | Wt 148.0 lb

## 2023-04-11 DIAGNOSIS — Z0000 Encounter for general adult medical examination without abnormal findings: Secondary | ICD-10-CM | POA: Diagnosis not present

## 2023-04-11 NOTE — Progress Notes (Signed)
Subjective:   [name] [name] is a 72 y.o. female who presents for Medicare Annual (Subsequent) preventive examination.  Visit Complete: Virtual  I connected with  [name] [name] on 04/11/23 by a audio enabled telemedicine application and verified that I am speaking with the correct person using two identifiers.  Patient Location: Home  Provider Location: Office/Clinic  I discussed the limitations of evaluation and management by telemedicine. The patient expressed understanding and agreed to proceed.  Vital Signs: Unable to obtain new vitals due to this being a telehealth visit.  Review of Systems     Cardiac Risk Factors include: advanced age (>68men, >55 women);dyslipidemia;hypertension     Objective:    Today's Vitals   04/11/23 1546  Weight: 148 lb (67.1 kg)  Height: 5\' 2"  (1.575 m)   Body mass index is 27.07 kg/m.     04/11/2023    3:57 PM 04/22/2022    7:10 AM 02/23/2022    3:08 PM 08/24/2020    8:51 AM 06/15/2020    3:13 PM 06/14/2020    8:07 PM 06/13/2020   11:19 PM  Advanced Directives  Does Patient Have a Medical Advance Directive? Yes Yes Yes Yes No No No  Type of Estate agent of [place];Living will Healthcare Power of [place];Living will Healthcare Power of [place];Living will      Does patient want to make changes to medical advance directive?   No - Patient declined      Copy of Healthcare Power of Attorney in Chart? No - copy requested No - copy requested Yes - validated most recent copy scanned in chart (See row information)      Would patient like information on creating a medical advance directive?     No - Patient declined  No - Patient declined    Current Medications (verified) Outpatient Encounter Medications as of 04/11/2023  Medication Sig   azelastine (ASTELIN) 0.1 % nasal spray USE 1 SPRAY IN BOTH NOSTRILS DAILY AS NEEDED   cholecalciferol (VITAMIN D) 1000 units tablet Take 1,000 Units by mouth daily.   Cyanocobalamin  (VITAMIN B-12 PO) Take by mouth.   Eszopiclone 3 MG TABS TAKE ONE-HALF - ONE TABLET BY MOUTH IMMEDIATELY BEFORE BEDTIME   irbesartan (AVAPRO) 150 MG tablet TAKE ONE TABLET (150 MG) BY MOUTH EVERY DAY   Nutritional Supplements (NUTRITIONAL SUPPLEMENT PO) Take by mouth. DoTERRA: ALPHA CRS+, xEO MEGA, MICROPLEX MVp, ON GUARD (protective blend)   omeprazole (PRILOSEC) 20 MG capsule Take 20 mg by mouth daily.   ondansetron (ZOFRAN ODT) 4 MG disintegrating tablet Take 1 tablet (4 mg total) by mouth every 8 (eight) hours as needed for nausea or vomiting.   rosuvastatin (CRESTOR) 10 MG tablet Take 1 tablet (10 mg total) by mouth daily.   SYNTHROID 175 MCG tablet Take one tablet every morning with a glass of water 30 to 60 minutes before breakfast   VITAMIN D PO Take by mouth.   sucralfate (CARAFATE) 1 GM/10ML suspension Take 10 mLs (1 g total) by mouth 4 (four) times daily -  with meals and at bedtime. (Patient not taking: Reported on 04/11/2023)   [DISCONTINUED] omeprazole (PRILOSEC) 40 MG capsule TAKE 1 CAPSULE BY MOUTH TWO TIMES DAILY (Patient not taking: Reported on 04/11/2023)   No facility-administered encounter medications on file as of 04/11/2023.    Allergies (verified) Codeine and Penicillins   History: Past Medical History:  Diagnosis Date   Adenomatous polyp 2005   Arrhythmia    H/O  Basal cell carcinoma    CTS (carpal tunnel syndrome)    Environmental allergies    History of chicken pox    HTN (hypertension)    Hypercholesterolemia    Hypothyroidism    Past Surgical History:  Procedure Laterality Date   BASAL CELL CARCINOMA EXCISION     right hip,abd, right arm, back neck   BREAST CYST ASPIRATION Left    negative   COLONOSCOPY WITH PROPOFOL N/A 08/24/2020   Procedure: COLONOSCOPY WITH PROPOFOL;  Surgeon: Toney Reil, MD;  Location: [facility] ENDOSCOPY;  Service: Gastroenterology;  Laterality: N/A;   CTS releast Rt wrist     ESOPHAGOGASTRODUODENOSCOPY (EGD) WITH PROPOFOL  N/A 04/22/2022   Procedure: ESOPHAGOGASTRODUODENOSCOPY (EGD) WITH PROPOFOL;  Surgeon: Toney Reil, MD;  Location: [facility] ENDOSCOPY;  Service: Gastroenterology;  Laterality: N/A;   lipoma removal     Rt shoulder    MELANOMA EXCISION     right buttocks, top of scalp   POLYPECTOMY     + TA   TUBAL LIGATION  1982   Family History  Problem Relation Age of Onset   Cervical cancer Mother    Arthritis Mother    Hypertension Mother    Sudden death Mother        cerbral hemorrage   Heart disease Father    Cancer Father        lung cancer   Diabetes Maternal Grandmother    Breast cancer Paternal Aunt 67   Colon cancer Neg Hx    Social History   Socioeconomic History   Marital status: Widowed    Spouse name: Not on file   Number of children: 1   Years of education: Not on file   Highest education level: Not on file  Occupational History   Occupation: ScheduliOfficeng/ Ortho     Employer: [employer]  Tobacco Use   Smoking status: Former    Current packs/day: 0.00    Types: Cigarettes    Quit date: 07/16/1980    Years since quitting: 42.7   Smokeless tobacco: Never  Vaping Use   Vaping status: Never Used  Substance and Sexual Activity   Alcohol use: Yes    Alcohol/week: 7.0 standard drinks of alcohol    Types: 7 Glasses of wine per week    Comment: 1 per day . NONE IN 4 WEEKS   Drug use: No   Sexual activity: Never  Other Topics Concern   Not on file  Social History Narrative   Widowed; 1 son.    Retired Sales executive   Daily caffeine use 2-3/day       Cell # [id number]   Social Determinants of Health   Financial Resource Strain: Low Risk  (04/11/2023)   Overall Financial Resource Strain (CARDIA)    Difficulty of Paying Living Expenses: Not hard at all  Food Insecurity: No Food Insecurity (04/11/2023)   Hunger Vital Sign    Worried About Running Out of Food in the Last Year: Never true    Ran Out of Food in the Last Year: Never true  Transportation Needs:  No Transportation Needs (04/11/2023)   PRAPARE - Administrator, Civil Service (Medical): No    Lack of Transportation (Non-Medical): No  Physical Activity: Sufficiently Active (04/11/2023)   Exercise Vital Sign    Days of Exercise per Week: 3 days    Minutes of Exercise per Session: 60 min  Stress: No Stress Concern Present (04/11/2023)   [employer] of Occupational  Health - Occupational Stress Questionnaire    Feeling of Stress : Not at all  Social Connections: Moderately Isolated (04/11/2023)   Social Connection and Isolation Panel [NHANES]    Frequency of Communication with Friends and Family: More than three times a week    Frequency of Social Gatherings with Friends and Family: More than three times a week    Attends Religious Services: More than 4 times per year    Active Member of [employer] or Organizations: No    Attends Banker Meetings: Never    Marital Status: Widowed    Tobacco Counseling Counseling given: Not Answered   Clinical Intake:  Pre-visit preparation completed: Yes  Pain : No/denies pain     BMI - recorded: 27.07 Nutritional Status: BMI 25 -29 Overweight Nutritional Risks: None Diabetes: No  How often do you need to have someone help you when you read instructions, pamphlets, or other written materials from your doctor or pharmacy?: 1 - Never  Interpreter Needed?: No  Information entered by :: R. Hildagarde Holleran LPN   Activities of Daily Living    04/11/2023    3:47 PM  In your present state of health, do you have any difficulty performing the following activities:  Hearing? 0  Vision? 0  Comment contacts or glasses  Difficulty concentrating or making decisions? 0  Walking or climbing stairs? 0  Dressing or bathing? 0  Doing errands, shopping? 0  Preparing Food and eating ? N  Using the Toilet? N  In the past six months, have you accidently leaked urine? N  Do you have problems with loss of bowel control? N  Managing your  Medications? N  Managing your Finances? N  Housekeeping or managing your Housekeeping? N    Patient Care Team: Dale Housatonic, MD as PCP - General (Internal Medicine)  Indicate any recent Medical Services you may have received from other than [facility] providers in the past year (date may be approximate).     Assessment:   This is a routine wellness examination for [employer].  Hearing/Vision screen Hearing Screening - Comments:: No issues Vision Screening - Comments:: Contacts and glasses  Dietary issues and exercise activities discussed:     Goals Addressed             This Visit's Progress    Patient Stated       Wants to lose about 15 pounds and exercise more       Depression Screen    04/11/2023    3:53 PM 10/18/2022    8:05 AM 09/12/2022    9:27 AM 09/06/2022    8:13 AM 07/21/2022    4:48 PM 07/13/2022    9:39 AM 06/27/2022    8:30 AM  PHQ 2/9 Scores  PHQ - 2 Score 0 0 0 0 0 0 0  PHQ- 9 Score 0          Fall Risk    04/11/2023    3:49 PM 10/18/2022    8:05 AM 09/12/2022    9:27 AM 09/06/2022    8:13 AM 07/21/2022    4:48 PM  Fall Risk   Falls in the past year? 1 0 0 0 0  Number falls in past yr: 0 0 0 0   Injury with Fall? 0 0 0 0   Risk for fall due to : No Fall Risks;History of fall(s);Impaired balance/gait No Fall Risks No Fall Risks No Fall Risks No Fall Risks  Follow up Falls prevention discussed;Falls  evaluation completed Falls evaluation completed Falls evaluation completed Falls evaluation completed Falls evaluation completed    MEDICARE RISK AT HOME: Medicare Risk at Home Any stairs in or around the home?: Yes If so, are there any without handrails?: No Home free of loose throw rugs in walkways, pet beds, electrical cords, etc?: Yes Adequate lighting in your home to reduce risk of falls?: Yes Life alert?: Yes Use of a cane, walker or w/c?: No Grab bars in the bathroom?: Yes Shower chair or bench in shower?: Yes Elevated toilet seat or a handicapped  toilet?: Yes   Cognitive Function:    02/19/2018   11:50 AM 02/17/2017    3:08 PM  MMSE - Mini Mental State Exam  Orientation to time 5 5  Orientation to Place 5 5  Registration 3 3  Attention/ Calculation 5 5  Recall 3 3  Language- name 2 objects 2 2  Language- repeat 1 1  Language- follow 3 step command 3 3  Language- read & follow direction 1 1  Write a sentence 1 1  Copy design 1 1  Total score 30 30        04/11/2023    3:58 PM 02/24/2020    9:45 AM 02/21/2019   10:09 AM  6CIT Screen  What Year? 0 points 0 points 0 points  What month? 0 points 0 points 0 points  What time? 0 points  0 points  Count back from 20 0 points  0 points  Months in reverse 0 points  0 points  Repeat phrase 0 points  0 points  Total Score 0 points  0 points    Immunizations Immunization History  Administered Date(s) Administered   Influenza Split 05/06/2014   Influenza, High Dose Seasonal PF 04/14/2016, 05/25/2018, 05/24/2019, 06/02/2022   Influenza,inj,Quad PF,6+ Mos 05/13/2015   Influenza-Unspecified 05/26/2020, 06/08/2021   PFIZER(Purple Top)SARS-COV-2 Vaccination 09/21/2019, 10/15/2019, 06/01/2020   Pneumococcal Conjugate-13 06/20/2014   Pneumococcal Polysaccharide-23 06/25/2015   Tdap 05/20/2011   Zoster Recombinant(Shingrix) 04/26/2018, 07/05/2018   Zoster, Live 09/20/2013    TDAP status: Due, Education has been provided regarding the importance of this vaccine. Advised may receive this vaccine at local pharmacy or Health Dept. Aware to provide a copy of the vaccination record if obtained from local pharmacy or Health Dept. Verbalized acceptance and understanding.  Flu Vaccine status: Up to date  Pneumococcal vaccine status: Up to date  Covid-19 vaccine status: Completed vaccines  Qualifies for Shingles Vaccine? Yes   Zostavax completed Yes   Shingrix Completed?: Yes  Screening Tests Health Maintenance  Topic Date Due   DTaP/Tdap/Td (2 - Td or Tdap) 05/19/2021   COVID-19  Vaccine (4 - 2023-24 season) 04/15/2022   Medicare Annual Wellness (AWV)  02/24/2023   INFLUENZA VACCINE  03/16/2023   MAMMOGRAM  12/29/2023   Colonoscopy  08/24/2030   Pneumonia Vaccine 79+ Years old  Completed   DEXA SCAN  Completed   Hepatitis C Screening  Completed   Zoster Vaccines- Shingrix  Completed   HPV VACCINES  Aged Out    Health Maintenance  Health Maintenance Due  Topic Date Due   DTaP/Tdap/Td (2 - Td or Tdap) 05/19/2021   COVID-19 Vaccine (4 - 2023-24 season) 04/15/2022   Medicare Annual Wellness (AWV)  02/24/2023   INFLUENZA VACCINE  03/16/2023    Colorectal cancer screening: Type of screening: Colonoscopy. Completed 1/22. Repeat every 10 years  Mammogram status: Completed 5/24. Repeat every year  Bone Density status: Completed 11/16. Results reflect:  Bone density results: OSTEOPENIA. Repeat every 2 years. Will discuss with PCP at next visit  Lung Cancer Screening: (Low Dose CT Chest recommended if Age 59-80 years, 20 pack-year currently smoking OR have quit w/in 15years.) does not qualify.    Additional Screening:  Hepatitis C Screening: does qualify; Completed 4/17  Vision Screening: Recommended annual ophthalmology exams for early detection of glaucoma and other disorders of the eye. Is the patient up to date with their annual eye exam?  Yes  Who is the provider or what is the name of the office in which the patient attends annual eye exams? [facility] If pt is not established with a provider, would they like to be referred to a provider to establish care? No .   Dental Screening: Recommended annual dental exams for proper oral hygiene    Community Resource Referral / Chronic Care Management: CRR required this visit?  No   CCM required this visit?  No     Plan:     I have personally reviewed and noted the following in the patient's chart:   Medical and social history Use of alcohol, tobacco or illicit drugs  Current medications and  supplements including opioid prescriptions. Patient is not currently taking opioid prescriptions. Functional ability and status Nutritional status Physical activity Advanced directives List of other physicians Hospitalizations, surgeries, and ER visits in previous 12 months Vitals Screenings to include cognitive, depression, and falls Referrals and appointments  In addition, I have reviewed and discussed with patient certain preventive protocols, quality metrics, and best practice recommendations. A written personalized care plan for preventive services as well as general preventive health recommendations were provided to patient.     Sydell Axon, LPN   09/02/1476   After Visit Summary: (MyChart) Due to this being a telephonic visit, the after visit summary with patients personalized plan was offered to patient via MyChart   Nurse Notes: None

## 2023-04-11 NOTE — Patient Instructions (Signed)
[name] [name] , Thank you for taking time to come for your Medicare Wellness Visit. I appreciate your ongoing commitment to your health goals. Please review the following plan we discussed and let me know if I can assist you in the future.   Referrals/Orders/Follow-Ups/Clinician Recommendations: None  This is a list of the screening recommended for you and due dates:  Health Maintenance  Topic Date Due   DTaP/Tdap/Td vaccine (2 - Td or Tdap) 05/19/2021   COVID-19 Vaccine (4 - 2023-24 season) 04/15/2022   Flu Shot  03/16/2023   Mammogram  12/29/2023   Medicare Annual Wellness Visit  04/10/2024   Colon Cancer Screening  08/24/2030   Pneumonia Vaccine  Completed   DEXA scan (bone density measurement)  Completed   Hepatitis C Screening  Completed   Zoster (Shingles) Vaccine  Completed   HPV Vaccine  Aged Out    Advanced directives: (Copy Requested) Please bring a copy of your health care power of attorney and living will to the office to be added to your chart at your convenience.  Next Medicare Annual Wellness Visit scheduled for next year: Yes 04/17/24 @ 9:15

## 2023-04-21 ENCOUNTER — Other Ambulatory Visit: Payer: Self-pay | Admitting: Internal Medicine

## 2023-04-21 NOTE — Telephone Encounter (Signed)
Rx ok'd for lunesta #30 with 2 refills.

## 2023-05-01 DIAGNOSIS — Z859 Personal history of malignant neoplasm, unspecified: Secondary | ICD-10-CM | POA: Diagnosis not present

## 2023-05-01 DIAGNOSIS — Z85820 Personal history of malignant melanoma of skin: Secondary | ICD-10-CM | POA: Diagnosis not present

## 2023-05-01 DIAGNOSIS — L578 Other skin changes due to chronic exposure to nonionizing radiation: Secondary | ICD-10-CM | POA: Diagnosis not present

## 2023-05-01 DIAGNOSIS — L638 Other alopecia areata: Secondary | ICD-10-CM | POA: Diagnosis not present

## 2023-05-01 DIAGNOSIS — Z85828 Personal history of other malignant neoplasm of skin: Secondary | ICD-10-CM | POA: Diagnosis not present

## 2023-05-01 DIAGNOSIS — D492 Neoplasm of unspecified behavior of bone, soft tissue, and skin: Secondary | ICD-10-CM | POA: Diagnosis not present

## 2023-05-01 DIAGNOSIS — Z872 Personal history of diseases of the skin and subcutaneous tissue: Secondary | ICD-10-CM | POA: Diagnosis not present

## 2023-05-01 DIAGNOSIS — Z86018 Personal history of other benign neoplasm: Secondary | ICD-10-CM | POA: Diagnosis not present

## 2023-06-26 ENCOUNTER — Other Ambulatory Visit: Payer: Self-pay | Admitting: Internal Medicine

## 2023-07-10 ENCOUNTER — Other Ambulatory Visit (INDEPENDENT_AMBULATORY_CARE_PROVIDER_SITE_OTHER): Payer: PPO

## 2023-07-10 DIAGNOSIS — E785 Hyperlipidemia, unspecified: Secondary | ICD-10-CM | POA: Diagnosis not present

## 2023-07-10 LAB — HEPATIC FUNCTION PANEL
ALT: 25 U/L (ref 0–35)
AST: 19 U/L (ref 0–37)
Albumin: 4.2 g/dL (ref 3.5–5.2)
Alkaline Phosphatase: 89 U/L (ref 39–117)
Bilirubin, Direct: 0.1 mg/dL (ref 0.0–0.3)
Total Bilirubin: 0.5 mg/dL (ref 0.2–1.2)
Total Protein: 6 g/dL (ref 6.0–8.3)

## 2023-07-10 LAB — LIPID PANEL
Cholesterol: 210 mg/dL — ABNORMAL HIGH (ref 0–200)
HDL: 61.6 mg/dL (ref >39.00)
LDL Cholesterol: 132 mg/dL — ABNORMAL HIGH (ref 0–99)
NonHDL: 148.7
Total CHOL/HDL Ratio: 3
Triglycerides: 86 mg/dL (ref 0.0–149.0)
VLDL: 17.2 mg/dL (ref 0.0–40.0)

## 2023-07-10 LAB — TSH: TSH: 3.03 u[IU]/mL (ref 0.35–5.50)

## 2023-07-10 LAB — BASIC METABOLIC PANEL
BUN: 24 mg/dL — ABNORMAL HIGH (ref 6–23)
CO2: 27 meq/L (ref 19–32)
Calcium: 8.8 mg/dL (ref 8.4–10.5)
Chloride: 105 meq/L (ref 96–112)
Creatinine, Ser: 0.8 mg/dL (ref 0.40–1.20)
GFR: 72.77 mL/min (ref >60.00)
Glucose, Bld: 93 mg/dL (ref 70–99)
Potassium: 4.2 meq/L (ref 3.5–5.1)
Sodium: 139 meq/L (ref 135–145)

## 2023-07-16 NOTE — Progress Notes (Unsigned)
Subjective:    Patient ID: [name] [name], female    DOB: 10/30/1950, 72 y.o.   MRN: [id number]  Patient here for No chief complaint on file.   HPI Here for a scheduled follow up - f/u regarding increased stress and hypertension. On avapro.  ? crestor   Past Medical History:  Diagnosis Date   Adenomatous polyp 2005   Arrhythmia    H/O   Basal cell carcinoma    CTS (carpal tunnel syndrome)    Environmental allergies    History of chicken pox    HTN (hypertension)    Hypercholesterolemia    Hypothyroidism    Past Surgical History:  Procedure Laterality Date   BASAL CELL CARCINOMA EXCISION     right hip,abd, right arm, back neck   BREAST CYST ASPIRATION Left    negative   COLONOSCOPY WITH PROPOFOL N/A 08/24/2020   Procedure: COLONOSCOPY WITH PROPOFOL;  Surgeon: Toney Reil, MD;  Location: [facility] ENDOSCOPY;  Service: Gastroenterology;  Laterality: N/A;   CTS releast Rt wrist     ESOPHAGOGASTRODUODENOSCOPY (EGD) WITH PROPOFOL N/A 04/22/2022   Procedure: ESOPHAGOGASTRODUODENOSCOPY (EGD) WITH PROPOFOL;  Surgeon: Toney Reil, MD;  Location: [facility] ENDOSCOPY;  Service: Gastroenterology;  Laterality: N/A;   lipoma removal     Rt shoulder    MELANOMA EXCISION     right buttocks, top of scalp   POLYPECTOMY     + TA   TUBAL LIGATION  1982   Family History  Problem Relation Age of Onset   Cervical cancer Mother    Arthritis Mother    Hypertension Mother    Sudden death Mother        cerbral hemorrage   Heart disease Father    Cancer Father        lung cancer   Diabetes Maternal Grandmother    Breast cancer Paternal Aunt 33   Colon cancer Neg Hx    Social History   Socioeconomic History   Marital status: Widowed    Spouse name: Not on file   Number of children: 1   Years of education: Not on file   Highest education level: Associate degree: occupational, Scientist, product/process development, or vocational program  Occupational History   Occupation: ScheduliOfficeng/ Ortho      Employer: [employer]  Tobacco Use   Smoking status: Former    Current packs/day: 0.00    Types: Cigarettes    Quit date: 07/16/1980    Years since quitting: 43.0   Smokeless tobacco: Never  Vaping Use   Vaping status: Never Used  Substance and Sexual Activity   Alcohol use: Yes    Alcohol/week: 7.0 standard drinks of alcohol    Types: 7 Glasses of wine per week    Comment: 1 per day . NONE IN 4 WEEKS   Drug use: No   Sexual activity: Never  Other Topics Concern   Not on file  Social History Narrative   Widowed; 1 son.    Retired Sales executive   Daily caffeine use 2-3/day       Cell # [id number]   Social Determinants of Health   Financial Resource Strain: Low Risk  (07/16/2023)   Overall Financial Resource Strain (CARDIA)    Difficulty of Paying Living Expenses: Not hard at all  Food Insecurity: No Food Insecurity (07/16/2023)   Hunger Vital Sign    Worried About Running Out of Food in the Last Year: Never true    Ran Out of Food in  the Last Year: Never true  Transportation Needs: No Transportation Needs (07/16/2023)   PRAPARE - Administrator, Civil Service (Medical): No    Lack of Transportation (Non-Medical): No  Physical Activity: Sufficiently Active (07/16/2023)   Exercise Vital Sign    Days of Exercise per Week: 3 days    Minutes of Exercise per Session: 50 min  Stress: No Stress Concern Present (07/16/2023)   [employer] of Occupational Health - Occupational Stress Questionnaire    Feeling of Stress : Only a little  Social Connections: Moderately Integrated (07/16/2023)   Social Connection and Isolation Panel [NHANES]    Frequency of Communication with Friends and Family: More than three times a week    Frequency of Social Gatherings with Friends and Family: More than three times a week    Attends Religious Services: More than 4 times per year    Active Member of [employer] or Organizations: Yes    Attends Banker Meetings: More than 4  times per year    Marital Status: Widowed     Review of Systems     Objective:     There were no vitals taken for this visit. Wt Readings from Last 3 Encounters:  04/11/23 148 lb (67.1 kg)  01/11/23 147 lb (66.7 kg)  10/18/22 144 lb 4.8 oz (65.5 kg)    Physical Exam   Outpatient Encounter Medications as of 07/17/2023  Medication Sig   azelastine (ASTELIN) 0.1 % nasal spray USE 1 SPRAY IN BOTH NOSTRILS DAILY AS NEEDED   cholecalciferol (VITAMIN D) 1000 units tablet Take 1,000 Units by mouth daily.   Cyanocobalamin (VITAMIN B-12 PO) Take by mouth.   Eszopiclone 3 MG TABS TAKE 1/2 TABLET BY MOUTH IMMEDIATELY BEFORE BEDTIME   irbesartan (AVAPRO) 150 MG tablet TAKE ONE TABLET (150 MG) BY MOUTH EVERY DAY   Nutritional Supplements (NUTRITIONAL SUPPLEMENT PO) Take by mouth. DoTERRA: ALPHA CRS+, xEO MEGA, MICROPLEX MVp, ON GUARD (protective blend)   omeprazole (PRILOSEC) 20 MG capsule Take 20 mg by mouth daily.   ondansetron (ZOFRAN ODT) 4 MG disintegrating tablet Take 1 tablet (4 mg total) by mouth every 8 (eight) hours as needed for nausea or vomiting.   rosuvastatin (CRESTOR) 10 MG tablet Take 1 tablet (10 mg total) by mouth daily.   sucralfate (CARAFATE) 1 GM/10ML suspension Take 10 mLs (1 g total) by mouth 4 (four) times daily -  with meals and at bedtime. (Patient not taking: Reported on 04/11/2023)   SYNTHROID 175 MCG tablet Take one tablet every morning with a glass of water 30 to 60 minutes before breakfast   VITAMIN D PO Take by mouth.   No facility-administered encounter medications on file as of 07/17/2023.     Lab Results  Component Value Date   WBC 6.0 06/27/2022   HGB 13.5 06/27/2022   HCT 40.0 06/27/2022   PLT 250.0 06/27/2022   GLUCOSE 93 07/10/2023   CHOL 210 (H) 07/10/2023   TRIG 86.0 07/10/2023   HDL 61.60 07/10/2023   LDLDIRECT 118.2 10/16/2006   LDLCALC 132 (H) 07/10/2023   ALT 25 07/10/2023   AST 19 07/10/2023   NA 139 07/10/2023   K 4.2 07/10/2023    CL 105 07/10/2023   CREATININE 0.80 07/10/2023   BUN 24 (H) 07/10/2023   CO2 27 07/10/2023   TSH 3.03 07/10/2023   HGBA1C 5.5 09/12/2022    MM 3D SCREEN BREAST BILATERAL  Result Date: 12/30/2022 CLINICAL DATA:  Screening. EXAM: DIGITAL  SCREENING BILATERAL MAMMOGRAM WITH TOMOSYNTHESIS AND CAD TECHNIQUE: Bilateral screening digital craniocaudal and mediolateral oblique mammograms were obtained. Bilateral screening digital breast tomosynthesis was performed. The images were evaluated with computer-aided detection. COMPARISON:  Previous exam(s). ACR Breast Density Category c: The breasts are heterogeneously dense, which may obscure small masses. FINDINGS: There are no findings suspicious for malignancy. IMPRESSION: No mammographic evidence of malignancy. A result letter of this screening mammogram will be mailed directly to the patient. RECOMMENDATION: Screening mammogram in one year. (Code:SM-B-01Y) BI-RADS CATEGORY  1: Negative. Electronically Signed   By: Meda Klinefelter M.D.   On: 12/30/2022 11:44       Assessment & Plan:  There are no diagnoses linked to this encounter.   Dale [place], MD

## 2023-07-17 ENCOUNTER — Encounter: Payer: Self-pay | Admitting: Internal Medicine

## 2023-07-17 ENCOUNTER — Ambulatory Visit (INDEPENDENT_AMBULATORY_CARE_PROVIDER_SITE_OTHER): Payer: PPO | Admitting: Internal Medicine

## 2023-07-17 VITALS — BP 120/72 | HR 77 | Temp 98.0°F | Resp 16 | Ht 62.0 in | Wt 140.8 lb

## 2023-07-17 DIAGNOSIS — Z860100 Personal history of colon polyps, unspecified: Secondary | ICD-10-CM

## 2023-07-17 DIAGNOSIS — Z9109 Other allergy status, other than to drugs and biological substances: Secondary | ICD-10-CM

## 2023-07-17 DIAGNOSIS — I700 Atherosclerosis of aorta: Secondary | ICD-10-CM | POA: Diagnosis not present

## 2023-07-17 DIAGNOSIS — E039 Hypothyroidism, unspecified: Secondary | ICD-10-CM

## 2023-07-17 DIAGNOSIS — E785 Hyperlipidemia, unspecified: Secondary | ICD-10-CM

## 2023-07-17 DIAGNOSIS — F439 Reaction to severe stress, unspecified: Secondary | ICD-10-CM | POA: Diagnosis not present

## 2023-07-17 DIAGNOSIS — K219 Gastro-esophageal reflux disease without esophagitis: Secondary | ICD-10-CM

## 2023-07-17 DIAGNOSIS — I10 Essential (primary) hypertension: Secondary | ICD-10-CM | POA: Diagnosis not present

## 2023-07-17 DIAGNOSIS — R739 Hyperglycemia, unspecified: Secondary | ICD-10-CM

## 2023-07-17 NOTE — Assessment & Plan Note (Signed)
Blood pressure as outlined. On avapro150mg  q day.  Follow pressures.  Follow metabolic panel. Blood pressure doing well.

## 2023-07-17 NOTE — Assessment & Plan Note (Signed)
Colonoscopy 08/24/20 - recommended f/u in 10 years.

## 2023-07-17 NOTE — Assessment & Plan Note (Signed)
No upper symptoms reported.  Continue current diet adjustment.

## 2023-07-17 NOTE — Assessment & Plan Note (Signed)
Continue crestor 

## 2023-07-17 NOTE — Assessment & Plan Note (Signed)
Offcrestor.  Caused muscle aches. Wants to remain off cholesterol medication. Low cholesterol diet and exercise.  Follow lipid panel and liver function tests.  . Lab Results  Component Value Date   CHOL 210 (H) 07/10/2023   HDL 61.60 07/10/2023   LDLCALC 132 (H) 07/10/2023   LDLDIRECT 118.2 10/16/2006   TRIG 86.0 07/10/2023   CHOLHDL 3 07/10/2023

## 2023-07-17 NOTE — Assessment & Plan Note (Signed)
On thyroid replacement.  Follow tsh.  

## 2023-07-17 NOTE — Assessment & Plan Note (Signed)
Has had increased stress with family medical issues.  Discussed.  Feels she is handling things well.   Overall appears to be doing much better. Notify me if feels needs any further intervention. Has good support.

## 2023-07-17 NOTE — Assessment & Plan Note (Signed)
Symptoms as outlined.  Improved on current regimen as outlined.  Will add astelin nasal spray.

## 2023-08-10 ENCOUNTER — Other Ambulatory Visit: Payer: Self-pay | Admitting: Internal Medicine

## 2023-09-04 ENCOUNTER — Encounter: Payer: Self-pay | Admitting: Internal Medicine

## 2023-09-06 NOTE — Telephone Encounter (Signed)
Discussed with Senita. Not taking pts at this time.

## 2023-10-01 ENCOUNTER — Encounter: Payer: Self-pay | Admitting: Internal Medicine

## 2023-10-02 NOTE — Telephone Encounter (Signed)
 Confirm she is on synthroid q day. If so, can change to generic (levothyroxine) q day. Will need tsh checked in 6 weeks to confirm remaining stable on generic.  Just have to confirm medication dose is keeping tsh wnl.

## 2023-10-03 NOTE — Telephone Encounter (Signed)
 Called and spoke with patient. She is on synthroid 175 but just paid for a 90 day supply. She is supposed to see Dr Lorin Picket in April and will be coming up due for another refill. Will plan to switch and schedule f/u tsh when she comes in.

## 2023-10-07 ENCOUNTER — Other Ambulatory Visit: Payer: Self-pay | Admitting: Internal Medicine

## 2023-10-09 NOTE — Telephone Encounter (Signed)
 LOV: 07/17/2023    NOV: 11/17/2023

## 2023-10-10 NOTE — Telephone Encounter (Signed)
Rx ok'd for lunesta #30 with one refill.

## 2023-10-23 DIAGNOSIS — M62830 Muscle spasm of back: Secondary | ICD-10-CM | POA: Diagnosis not present

## 2023-10-23 DIAGNOSIS — M9903 Segmental and somatic dysfunction of lumbar region: Secondary | ICD-10-CM | POA: Diagnosis not present

## 2023-10-23 DIAGNOSIS — M9904 Segmental and somatic dysfunction of sacral region: Secondary | ICD-10-CM | POA: Diagnosis not present

## 2023-10-23 DIAGNOSIS — M5416 Radiculopathy, lumbar region: Secondary | ICD-10-CM | POA: Diagnosis not present

## 2023-10-25 DIAGNOSIS — M5416 Radiculopathy, lumbar region: Secondary | ICD-10-CM | POA: Diagnosis not present

## 2023-10-25 DIAGNOSIS — M9904 Segmental and somatic dysfunction of sacral region: Secondary | ICD-10-CM | POA: Diagnosis not present

## 2023-10-25 DIAGNOSIS — M9903 Segmental and somatic dysfunction of lumbar region: Secondary | ICD-10-CM | POA: Diagnosis not present

## 2023-10-25 DIAGNOSIS — M62830 Muscle spasm of back: Secondary | ICD-10-CM | POA: Diagnosis not present

## 2023-10-27 DIAGNOSIS — M62830 Muscle spasm of back: Secondary | ICD-10-CM | POA: Diagnosis not present

## 2023-10-27 DIAGNOSIS — M9903 Segmental and somatic dysfunction of lumbar region: Secondary | ICD-10-CM | POA: Diagnosis not present

## 2023-10-27 DIAGNOSIS — M9904 Segmental and somatic dysfunction of sacral region: Secondary | ICD-10-CM | POA: Diagnosis not present

## 2023-10-27 DIAGNOSIS — M5416 Radiculopathy, lumbar region: Secondary | ICD-10-CM | POA: Diagnosis not present

## 2023-10-30 DIAGNOSIS — M5416 Radiculopathy, lumbar region: Secondary | ICD-10-CM | POA: Diagnosis not present

## 2023-10-30 DIAGNOSIS — M9903 Segmental and somatic dysfunction of lumbar region: Secondary | ICD-10-CM | POA: Diagnosis not present

## 2023-10-30 DIAGNOSIS — M9904 Segmental and somatic dysfunction of sacral region: Secondary | ICD-10-CM | POA: Diagnosis not present

## 2023-10-30 DIAGNOSIS — M62830 Muscle spasm of back: Secondary | ICD-10-CM | POA: Diagnosis not present

## 2023-11-01 DIAGNOSIS — M5416 Radiculopathy, lumbar region: Secondary | ICD-10-CM | POA: Diagnosis not present

## 2023-11-01 DIAGNOSIS — M62830 Muscle spasm of back: Secondary | ICD-10-CM | POA: Diagnosis not present

## 2023-11-01 DIAGNOSIS — M9904 Segmental and somatic dysfunction of sacral region: Secondary | ICD-10-CM | POA: Diagnosis not present

## 2023-11-01 DIAGNOSIS — M9903 Segmental and somatic dysfunction of lumbar region: Secondary | ICD-10-CM | POA: Diagnosis not present

## 2023-11-06 DIAGNOSIS — M9903 Segmental and somatic dysfunction of lumbar region: Secondary | ICD-10-CM | POA: Diagnosis not present

## 2023-11-06 DIAGNOSIS — M5416 Radiculopathy, lumbar region: Secondary | ICD-10-CM | POA: Diagnosis not present

## 2023-11-06 DIAGNOSIS — M9904 Segmental and somatic dysfunction of sacral region: Secondary | ICD-10-CM | POA: Diagnosis not present

## 2023-11-06 DIAGNOSIS — M62830 Muscle spasm of back: Secondary | ICD-10-CM | POA: Diagnosis not present

## 2023-11-08 DIAGNOSIS — M62830 Muscle spasm of back: Secondary | ICD-10-CM | POA: Diagnosis not present

## 2023-11-08 DIAGNOSIS — M9904 Segmental and somatic dysfunction of sacral region: Secondary | ICD-10-CM | POA: Diagnosis not present

## 2023-11-08 DIAGNOSIS — M9903 Segmental and somatic dysfunction of lumbar region: Secondary | ICD-10-CM | POA: Diagnosis not present

## 2023-11-08 DIAGNOSIS — M5416 Radiculopathy, lumbar region: Secondary | ICD-10-CM | POA: Diagnosis not present

## 2023-11-10 DIAGNOSIS — M9903 Segmental and somatic dysfunction of lumbar region: Secondary | ICD-10-CM | POA: Diagnosis not present

## 2023-11-10 DIAGNOSIS — M9904 Segmental and somatic dysfunction of sacral region: Secondary | ICD-10-CM | POA: Diagnosis not present

## 2023-11-10 DIAGNOSIS — M5416 Radiculopathy, lumbar region: Secondary | ICD-10-CM | POA: Diagnosis not present

## 2023-11-10 DIAGNOSIS — M62830 Muscle spasm of back: Secondary | ICD-10-CM | POA: Diagnosis not present

## 2023-11-15 ENCOUNTER — Other Ambulatory Visit (INDEPENDENT_AMBULATORY_CARE_PROVIDER_SITE_OTHER): Payer: PPO

## 2023-11-15 DIAGNOSIS — M9903 Segmental and somatic dysfunction of lumbar region: Secondary | ICD-10-CM | POA: Diagnosis not present

## 2023-11-15 DIAGNOSIS — E785 Hyperlipidemia, unspecified: Secondary | ICD-10-CM | POA: Diagnosis not present

## 2023-11-15 DIAGNOSIS — R739 Hyperglycemia, unspecified: Secondary | ICD-10-CM

## 2023-11-15 DIAGNOSIS — M9904 Segmental and somatic dysfunction of sacral region: Secondary | ICD-10-CM | POA: Diagnosis not present

## 2023-11-15 DIAGNOSIS — I10 Essential (primary) hypertension: Secondary | ICD-10-CM

## 2023-11-15 DIAGNOSIS — M5416 Radiculopathy, lumbar region: Secondary | ICD-10-CM | POA: Diagnosis not present

## 2023-11-15 DIAGNOSIS — M62830 Muscle spasm of back: Secondary | ICD-10-CM | POA: Diagnosis not present

## 2023-11-15 LAB — HEPATIC FUNCTION PANEL
ALT: 32 U/L (ref 0–35)
AST: 27 U/L (ref 0–37)
Albumin: 4.5 g/dL (ref 3.5–5.2)
Alkaline Phosphatase: 92 U/L (ref 39–117)
Bilirubin, Direct: 0.1 mg/dL (ref 0.0–0.3)
Total Bilirubin: 0.5 mg/dL (ref 0.2–1.2)
Total Protein: 6.5 g/dL (ref 6.0–8.3)

## 2023-11-15 LAB — BASIC METABOLIC PANEL WITH GFR
BUN: 21 mg/dL (ref 6–23)
CO2: 29 meq/L (ref 19–32)
Calcium: 9.1 mg/dL (ref 8.4–10.5)
Chloride: 102 meq/L (ref 96–112)
Creatinine, Ser: 0.71 mg/dL (ref 0.40–1.20)
GFR: 83.76 mL/min (ref >60.00)
Glucose, Bld: 98 mg/dL (ref 70–99)
Potassium: 4.5 meq/L (ref 3.5–5.1)
Sodium: 138 meq/L (ref 135–145)

## 2023-11-15 LAB — LIPID PANEL
Cholesterol: 225 mg/dL — ABNORMAL HIGH (ref 0–200)
HDL: 86.4 mg/dL (ref >39.00)
LDL Cholesterol: 125 mg/dL — ABNORMAL HIGH (ref 0–99)
NonHDL: 138.97
Total CHOL/HDL Ratio: 3
Triglycerides: 71 mg/dL (ref 0.0–149.0)
VLDL: 14.2 mg/dL (ref 0.0–40.0)

## 2023-11-15 LAB — CBC WITH DIFFERENTIAL/PLATELET
Basophils Absolute: 0 10*3/uL (ref 0.0–0.1)
Basophils Relative: 0.6 % (ref 0.0–3.0)
Eosinophils Absolute: 0.5 10*3/uL (ref 0.0–0.7)
Eosinophils Relative: 8.8 % — ABNORMAL HIGH (ref 0.0–5.0)
HCT: 40 % (ref 36.0–46.0)
Hemoglobin: 13.6 g/dL (ref 12.0–15.0)
Lymphocytes Relative: 28.9 % (ref 12.0–46.0)
Lymphs Abs: 1.7 10*3/uL (ref 0.7–4.0)
MCHC: 34 g/dL (ref 30.0–36.0)
MCV: 88.4 fl (ref 78.0–100.0)
Monocytes Absolute: 0.6 10*3/uL (ref 0.1–1.0)
Monocytes Relative: 9.9 % (ref 3.0–12.0)
Neutro Abs: 3 10*3/uL (ref 1.4–7.7)
Neutrophils Relative %: 51.8 % (ref 43.0–77.0)
Platelets: 255 10*3/uL (ref 150.0–400.0)
RBC: 4.52 Mil/uL (ref 3.87–5.11)
RDW: 12.5 % (ref 11.5–15.5)
WBC: 5.8 10*3/uL (ref 4.0–10.5)

## 2023-11-15 LAB — HEMOGLOBIN A1C: Hgb A1c MFr Bld: 5.4 % (ref 4.6–6.5)

## 2023-11-17 ENCOUNTER — Encounter: Payer: Self-pay | Admitting: Internal Medicine

## 2023-11-17 ENCOUNTER — Ambulatory Visit: Payer: PPO | Admitting: Internal Medicine

## 2023-11-17 VITALS — BP 128/72 | HR 65 | Temp 98.0°F | Resp 16 | Ht 62.0 in | Wt 145.4 lb

## 2023-11-17 DIAGNOSIS — Z1231 Encounter for screening mammogram for malignant neoplasm of breast: Secondary | ICD-10-CM

## 2023-11-17 DIAGNOSIS — E039 Hypothyroidism, unspecified: Secondary | ICD-10-CM

## 2023-11-17 DIAGNOSIS — G479 Sleep disorder, unspecified: Secondary | ICD-10-CM

## 2023-11-17 DIAGNOSIS — I700 Atherosclerosis of aorta: Secondary | ICD-10-CM

## 2023-11-17 DIAGNOSIS — F439 Reaction to severe stress, unspecified: Secondary | ICD-10-CM

## 2023-11-17 DIAGNOSIS — I10 Essential (primary) hypertension: Secondary | ICD-10-CM

## 2023-11-17 DIAGNOSIS — R739 Hyperglycemia, unspecified: Secondary | ICD-10-CM | POA: Diagnosis not present

## 2023-11-17 DIAGNOSIS — Z0000 Encounter for general adult medical examination without abnormal findings: Secondary | ICD-10-CM

## 2023-11-17 DIAGNOSIS — R519 Headache, unspecified: Secondary | ICD-10-CM | POA: Diagnosis not present

## 2023-11-17 DIAGNOSIS — E2839 Other primary ovarian failure: Secondary | ICD-10-CM

## 2023-11-17 DIAGNOSIS — E785 Hyperlipidemia, unspecified: Secondary | ICD-10-CM | POA: Diagnosis not present

## 2023-11-17 LAB — SEDIMENTATION RATE: Sed Rate: 1 mm/h (ref 0–30)

## 2023-11-17 MED ORDER — IRBESARTAN 150 MG PO TABS: 150.0000 mg | ORAL_TABLET | Freq: Every day | ORAL | 3 refills | Status: DC

## 2023-11-17 MED ORDER — IRBESARTAN 300 MG PO TABS
300.0000 mg | ORAL_TABLET | Freq: Every day | ORAL | 1 refills | Status: DC
Start: 2023-11-17 — End: 2024-05-16

## 2023-11-17 NOTE — Assessment & Plan Note (Signed)
 Offcrestor.  Caused muscle aches. Wants to remain off cholesterol medication. Low cholesterol diet and exercise.  Follow lipid panel and liver function tests.  . Lab Results  Component Value Date   CHOL 225 (H) 11/15/2023   HDL 86.40 11/15/2023   LDLCALC 125 (H) 11/15/2023   LDLDIRECT 118.2 10/16/2006   TRIG 71.0 11/15/2023   CHOLHDL 3 11/15/2023  The 10-year ASCVD risk score (Arnett DK, et al., 2019) is: 19.1%   Values used to calculate the score:     Age: 73 years     Sex: Female     Is Non-Hispanic African American: No     Diabetic: No     Tobacco smoker: No     Systolic Blood Pressure: 128 mmHg     Is BP treated: Yes     HDL Cholesterol: 86.4 mg/dL     Total Cholesterol: 225 mg/dL

## 2023-11-17 NOTE — Assessment & Plan Note (Addendum)
 Physical today postponed today. Too early.  Colonoscopy 08/2020 - non bleeding external hemorrhoids.  Recommended f/u in 10 years.  Mammogram 12/29/22 - birads I.

## 2023-11-17 NOTE — Progress Notes (Signed)
 Subjective:    Patient ID: [name] [name], female    DOB: Aug 20, 1950, 73 y.o.   MRN: [id number]  Patient here for  Chief Complaint  Patient presents with   Medical Management of Chronic Issues    HPI Here for a physical exam. Continues on avapro. Off crestor - due to muscle aches. Still with increased stress. Is some better. Overall appears to be handling things relatively well. Stays active. Recently - low back discomfort. Doing In Motion, stretches and Pilates. No chest pain or sob reported. No abdominal pain or bowel change reported. Reviewed outside blood pressures. Elevated - 140-150/70s. Discussed recent labs.    Past Medical History:  Diagnosis Date   Adenomatous polyp 2005   Arrhythmia    H/O   Basal cell carcinoma    CTS (carpal tunnel syndrome)    Environmental allergies    History of chicken pox    HTN (hypertension)    Hypercholesterolemia    Hypothyroidism    Past Surgical History:  Procedure Laterality Date   BASAL CELL CARCINOMA EXCISION     right hip,abd, right arm, back neck   BREAST CYST ASPIRATION Left    negative   COLONOSCOPY WITH PROPOFOL N/A 08/24/2020   Procedure: COLONOSCOPY WITH PROPOFOL;  Surgeon: Toney Reil, MD;  Location: [facility] ENDOSCOPY;  Service: Gastroenterology;  Laterality: N/A;   CTS releast Rt wrist     ESOPHAGOGASTRODUODENOSCOPY (EGD) WITH PROPOFOL N/A 04/22/2022   Procedure: ESOPHAGOGASTRODUODENOSCOPY (EGD) WITH PROPOFOL;  Surgeon: Toney Reil, MD;  Location: [facility] ENDOSCOPY;  Service: Gastroenterology;  Laterality: N/A;   lipoma removal     Rt shoulder    MELANOMA EXCISION     right buttocks, top of scalp   POLYPECTOMY     + TA   TUBAL LIGATION  1982   Family History  Problem Relation Age of Onset   Cervical cancer Mother    Arthritis Mother    Hypertension Mother    Sudden death Mother        cerbral hemorrage   Heart disease Father    Cancer Father        lung cancer   Diabetes Maternal Grandmother     Breast cancer Paternal Aunt 24   Colon cancer Neg Hx    Social History   Socioeconomic History   Marital status: Widowed    Spouse name: Not on file   Number of children: 1   Years of education: Not on file   Highest education level: Associate degree: academic program  Occupational History   Occupation: [employer]/ [employer]     Employer: [employer]  Tobacco Use   Smoking status: Former    Current packs/day: 0.00    Types: Cigarettes    Quit date: 07/16/1980    Years since quitting: 43.3   Smokeless tobacco: Never  Vaping Use   Vaping status: Never Used  Substance and Sexual Activity   Alcohol use: Yes    Alcohol/week: 7.0 standard drinks of alcohol    Types: 7 Glasses of wine per week    Comment: 1 per day . NONE IN 4 WEEKS   Drug use: No   Sexual activity: Never  Other Topics Concern   Not on file  Social History Narrative   Widowed; 1 son.    Retired Sales executive   Daily caffeine use 2-3/day       Cell # [id number]   Social Drivers of Corporate investment banker Strain: Low Risk  (  11/15/2023)   Overall Financial Resource Strain (CARDIA)    Difficulty of Paying Living Expenses: Not hard at all  Food Insecurity: No Food Insecurity (11/15/2023)   Hunger Vital Sign    Worried About Running Out of Food in the Last Year: Never true    Ran Out of Food in the Last Year: Never true  Transportation Needs: No Transportation Needs (11/15/2023)   PRAPARE - Administrator, Civil Service (Medical): No    Lack of Transportation (Non-Medical): No  Physical Activity: Insufficiently Active (11/15/2023)   Exercise Vital Sign    Days of Exercise per Week: 2 days    Minutes of Exercise per Session: 50 min  Stress: Stress Concern Present (11/15/2023)   [employer] of Occupational Health - Occupational Stress Questionnaire    Feeling of Stress : To some extent  Social Connections: Moderately Integrated (11/15/2023)   Social Connection and Isolation Panel [NHANES]     Frequency of Communication with Friends and Family: More than three times a week    Frequency of Social Gatherings with Friends and Family: Three times a week    Attends Religious Services: More than 4 times per year    Active Member of Clubs or Organizations: Yes    Attends Banker Meetings: More than 4 times per year    Marital Status: Widowed     Review of Systems  Constitutional:  Negative for appetite change and unexpected weight change.  HENT:  Negative for congestion, sinus pressure and sore throat.   Eyes:  Negative for pain and visual disturbance.  Respiratory:  Negative for cough, chest tightness and shortness of breath.   Cardiovascular:  Negative for chest pain, palpitations and leg swelling.  Gastrointestinal:  Negative for abdominal pain, diarrhea, nausea and vomiting.  Genitourinary:  Negative for difficulty urinating and dysuria.  Musculoskeletal:  Negative for joint swelling and myalgias.  Skin:  Negative for color change and rash.  Neurological:  Negative for dizziness and headaches.  Hematological:  Negative for adenopathy. Does not bruise/bleed easily.  Psychiatric/Behavioral:  Negative for agitation and dysphoric mood.        Objective:     BP 128/72   Pulse 65   Temp 98 F (36.7 C)   Resp 16   Ht 5\' 2"  (1.575 m)   Wt 145 lb 6.4 oz (66 kg)   SpO2 98%   BMI 26.59 kg/m  Wt Readings from Last 3 Encounters:  11/17/23 145 lb 6.4 oz (66 kg)  07/17/23 140 lb 12.8 oz (63.9 kg)  04/11/23 148 lb (67.1 kg)    Physical Exam Vitals reviewed.  Constitutional:      General: She is not in acute distress.    Appearance: Normal appearance. She is well-developed.  HENT:     Head: Normocephalic and atraumatic.     Right Ear: External ear normal.     Left Ear: External ear normal.     Mouth/Throat:     Pharynx: No oropharyngeal exudate or posterior oropharyngeal erythema.  Eyes:     General: No scleral icterus.       Right eye: No discharge.         Left eye: No discharge.     Conjunctiva/sclera: Conjunctivae normal.  Neck:     Thyroid: No thyromegaly.  Cardiovascular:     Rate and Rhythm: Normal rate and regular rhythm.  Pulmonary:     Effort: No tachypnea, accessory muscle usage or respiratory distress.  Breath sounds: Normal breath sounds. No decreased breath sounds or wheezing.  Chest:  Breasts:    Right: No inverted nipple, mass, nipple discharge or tenderness (no axillary adenopathy).     Left: No inverted nipple, mass, nipple discharge or tenderness (no axilarry adenopathy).  Abdominal:     General: Bowel sounds are normal.     Palpations: Abdomen is soft.     Tenderness: There is no abdominal tenderness.  Musculoskeletal:        General: No swelling or tenderness.     Cervical back: Neck supple.  Lymphadenopathy:     Cervical: No cervical adenopathy.  Skin:    Findings: No erythema or rash.  Neurological:     Mental Status: She is alert and oriented to person, place, and time.  Psychiatric:        Mood and Affect: Mood normal.        Behavior: Behavior normal.         Outpatient Encounter Medications as of 11/17/2023  Medication Sig   irbesartan (AVAPRO) 300 MG tablet Take 1 tablet (300 mg total) by mouth daily.   azelastine (ASTELIN) 0.1 % nasal spray USE 1 SPRAY IN BOTH NOSTRILS DAILY AS NEEDED   cholecalciferol (VITAMIN D) 1000 units tablet Take 1,000 Units by mouth daily.   Cyanocobalamin (VITAMIN B-12 PO) Take by mouth.   Eszopiclone 3 MG TABS TAKE 1/2 TABLET BY MOUTH IMMEDIATELY BEFORE BEDTIME   Nutritional Supplements (NUTRITIONAL SUPPLEMENT PO) Take by mouth. DoTERRA: ALPHA CRS+, xEO MEGA, MICROPLEX MVp, ON GUARD (protective blend)   ondansetron (ZOFRAN ODT) 4 MG disintegrating tablet Take 1 tablet (4 mg total) by mouth every 8 (eight) hours as needed for nausea or vomiting.   SYNTHROID 175 MCG tablet Take one tablet every morning with a glass of water 30 to 60 minutes before breakfast    VITAMIN D PO Take by mouth.   Zinc 30 MG CAPS Take 30 mg by mouth daily.   [DISCONTINUED] irbesartan (AVAPRO) 150 MG tablet TAKE ONE TABLET (150 MG) BY MOUTH EVERY DAY   [DISCONTINUED] irbesartan (AVAPRO) 150 MG tablet Take 1 tablet (150 mg total) by mouth daily.   No facility-administered encounter medications on file as of 11/17/2023.     Lab Results  Component Value Date   WBC 5.8 11/15/2023   HGB 13.6 11/15/2023   HCT 40.0 11/15/2023   PLT 255.0 11/15/2023   GLUCOSE 98 11/15/2023   CHOL 225 (H) 11/15/2023   TRIG 71.0 11/15/2023   HDL 86.40 11/15/2023   LDLDIRECT 118.2 10/16/2006   LDLCALC 125 (H) 11/15/2023   ALT 32 11/15/2023   AST 27 11/15/2023   NA 138 11/15/2023   K 4.5 11/15/2023   CL 102 11/15/2023   CREATININE 0.71 11/15/2023   BUN 21 11/15/2023   CO2 29 11/15/2023   TSH 3.03 07/10/2023   HGBA1C 5.4 11/15/2023    MM 3D SCREEN BREAST BILATERAL Result Date: 12/30/2022 CLINICAL DATA:  Screening. EXAM: DIGITAL SCREENING BILATERAL MAMMOGRAM WITH TOMOSYNTHESIS AND CAD TECHNIQUE: Bilateral screening digital craniocaudal and mediolateral oblique mammograms were obtained. Bilateral screening digital breast tomosynthesis was performed. The images were evaluated with computer-aided detection. COMPARISON:  Previous exam(s). ACR Breast Density Category c: The breasts are heterogeneously dense, which may obscure small masses. FINDINGS: There are no findings suspicious for malignancy. IMPRESSION: No mammographic evidence of malignancy. A result letter of this screening mammogram will be mailed directly to the patient. RECOMMENDATION: Screening mammogram in one year. (Code:SM-B-01Y) BI-RADS CATEGORY  1: Negative. Electronically Signed   By: Meda Klinefelter M.D.   On: 12/30/2022 11:44       Assessment & Plan:  Routine general medical examination at a health care facility  Hyperlipidemia, unspecified hyperlipidemia type Assessment & Plan:  Offcrestor.  Caused muscle aches. Wants  to remain off cholesterol medication. Low cholesterol diet and exercise.  Follow lipid panel and liver function tests.  . Lab Results  Component Value Date   CHOL 225 (H) 11/15/2023   HDL 86.40 11/15/2023   LDLCALC 125 (H) 11/15/2023   LDLDIRECT 118.2 10/16/2006   TRIG 71.0 11/15/2023   CHOLHDL 3 11/15/2023  The 10-year ASCVD risk score (Arnett DK, et al., 2019) is: 19.1%   Values used to calculate the score:     Age: 27 years     Sex: Female     Is Non-Hispanic African American: No     Diabetic: No     Tobacco smoker: No     Systolic Blood Pressure: 128 mmHg     Is BP treated: Yes     HDL Cholesterol: 86.4 mg/dL     Total Cholesterol: 225 mg/dL   Orders: -     Lipid panel; Future -     Hepatic function panel; Future  Hyperglycemia Assessment & Plan: Low carb diet and exercise. Follow met b and A1c.   Lab Results  Component Value Date   HGBA1C 5.4 11/15/2023     Orders: -     Hemoglobin A1c; Future  Essential hypertension Assessment & Plan: Blood pressure as outlined. On avapro150mg  q day.  Given persistent increase, will increase avapro to 300mg  q day. Follow pressures.  Follow metabolic panel.  Orders: -     Basic metabolic panel with GFR; Future  Health care maintenance Assessment & Plan: Physical today postponed today. Too early.  Colonoscopy 08/2020 - non bleeding external hemorrhoids.  Recommended f/u in 10 years.  Mammogram 12/29/22 - birads I.    Visit for screening mammogram -     3D Screening Mammogram, Left and Right; Future  Estrogen deficiency -     DG Bone Density; Future  Nonintractable headache, unspecified chronicity pattern, unspecified headache type -     Sedimentation rate  Aortic atherosclerosis (HCC) Assessment & Plan: Off crestor due to muscle aches. Adjust blood pressure medication for better blood pressure control.    Stress Assessment & Plan: Has had increased stress with family medical issues.  Discussed. Some better. Feels she  is handling things well.   Overall appears to be doing much better. Notify me if feels needs any further intervention. Has good support. Follow.    Sleeping difficulty Assessment & Plan: Taking 1/2 lunesta.  Follow.    Hypothyroidism, unspecified type Assessment & Plan: On thyroid replacement.  Follow tsh.    Other orders -     Irbesartan; Take 1 tablet (300 mg total) by mouth daily.  Dispense: 90 tablet; Refill: 1     Dale Malone, MD

## 2023-11-20 NOTE — Assessment & Plan Note (Signed)
 Has had increased stress with family medical issues.  Discussed. Some better. Feels she is handling things well.   Overall appears to be doing much better. Notify me if feels needs any further intervention. Has good support. Follow.

## 2023-11-20 NOTE — Assessment & Plan Note (Signed)
 On thyroid replacement.  Follow tsh.

## 2023-11-20 NOTE — Assessment & Plan Note (Signed)
Taking 1/2 lunesta.  Follow.

## 2023-11-20 NOTE — Assessment & Plan Note (Signed)
 Blood pressure as outlined. On avapro150mg  q day.  Given persistent increase, will increase avapro to 300mg  q day. Follow pressures.  Follow metabolic panel.

## 2023-11-20 NOTE — Assessment & Plan Note (Addendum)
 Low carb diet and exercise. Follow met b and A1c.   Lab Results  Component Value Date   HGBA1C 5.4 11/15/2023

## 2023-11-20 NOTE — Assessment & Plan Note (Signed)
 Off crestor due to muscle aches. Adjust blood pressure medication for better blood pressure control.

## 2023-11-22 DIAGNOSIS — M5416 Radiculopathy, lumbar region: Secondary | ICD-10-CM | POA: Diagnosis not present

## 2023-11-22 DIAGNOSIS — M9904 Segmental and somatic dysfunction of sacral region: Secondary | ICD-10-CM | POA: Diagnosis not present

## 2023-11-22 DIAGNOSIS — M62830 Muscle spasm of back: Secondary | ICD-10-CM | POA: Diagnosis not present

## 2023-11-22 DIAGNOSIS — M9903 Segmental and somatic dysfunction of lumbar region: Secondary | ICD-10-CM | POA: Diagnosis not present

## 2023-11-29 DIAGNOSIS — M62830 Muscle spasm of back: Secondary | ICD-10-CM | POA: Diagnosis not present

## 2023-11-29 DIAGNOSIS — M5416 Radiculopathy, lumbar region: Secondary | ICD-10-CM | POA: Diagnosis not present

## 2023-11-29 DIAGNOSIS — M9904 Segmental and somatic dysfunction of sacral region: Secondary | ICD-10-CM | POA: Diagnosis not present

## 2023-11-29 DIAGNOSIS — M9903 Segmental and somatic dysfunction of lumbar region: Secondary | ICD-10-CM | POA: Diagnosis not present

## 2023-12-04 ENCOUNTER — Other Ambulatory Visit: Payer: Self-pay | Admitting: Internal Medicine

## 2023-12-04 NOTE — Telephone Encounter (Signed)
 Rx ok'd for refill lunesta . PDMP reviewed.

## 2023-12-13 DIAGNOSIS — M62830 Muscle spasm of back: Secondary | ICD-10-CM | POA: Diagnosis not present

## 2023-12-13 DIAGNOSIS — M9904 Segmental and somatic dysfunction of sacral region: Secondary | ICD-10-CM | POA: Diagnosis not present

## 2023-12-13 DIAGNOSIS — M5416 Radiculopathy, lumbar region: Secondary | ICD-10-CM | POA: Diagnosis not present

## 2023-12-13 DIAGNOSIS — M9903 Segmental and somatic dysfunction of lumbar region: Secondary | ICD-10-CM | POA: Diagnosis not present

## 2023-12-25 ENCOUNTER — Other Ambulatory Visit: Payer: Self-pay

## 2023-12-25 ENCOUNTER — Encounter (HOSPITAL_COMMUNITY): Payer: Self-pay

## 2023-12-25 ENCOUNTER — Encounter: Payer: Self-pay | Admitting: Internal Medicine

## 2023-12-25 ENCOUNTER — Other Ambulatory Visit: Payer: Self-pay | Admitting: Medical Genetics

## 2023-12-25 DIAGNOSIS — H5203 Hypermetropia, bilateral: Secondary | ICD-10-CM | POA: Diagnosis not present

## 2023-12-25 DIAGNOSIS — H524 Presbyopia: Secondary | ICD-10-CM | POA: Diagnosis not present

## 2023-12-25 DIAGNOSIS — H52223 Regular astigmatism, bilateral: Secondary | ICD-10-CM | POA: Diagnosis not present

## 2023-12-25 MED ORDER — LEVOTHYROXINE SODIUM 175 MCG PO TABS: 175.0000 ug | ORAL_TABLET | Freq: Every day | ORAL | 1 refills | Status: DC

## 2023-12-25 NOTE — Telephone Encounter (Signed)
 I sent her levothyroxine  to [facility] but wanted you to see the rest of this message.

## 2023-12-25 NOTE — Telephone Encounter (Signed)
 Reviewed. Blood pressures have improved. Continue avapro  300mg  q day. Continue to follow pressures. Agree with eye evaluation if persistent head pain and eye pain.  Have her update us  after eye appt.  Also need records.  If persistent headache, will need to be evaluated.

## 2023-12-27 DIAGNOSIS — M9903 Segmental and somatic dysfunction of lumbar region: Secondary | ICD-10-CM | POA: Diagnosis not present

## 2023-12-27 DIAGNOSIS — M62830 Muscle spasm of back: Secondary | ICD-10-CM | POA: Diagnosis not present

## 2023-12-27 DIAGNOSIS — M5416 Radiculopathy, lumbar region: Secondary | ICD-10-CM | POA: Diagnosis not present

## 2023-12-27 DIAGNOSIS — M9904 Segmental and somatic dysfunction of sacral region: Secondary | ICD-10-CM | POA: Diagnosis not present

## 2023-12-29 ENCOUNTER — Other Ambulatory Visit
Admission: RE | Admit: 2023-12-29 | Discharge: 2023-12-29 | Disposition: A | Discharge status: Home / Self Care | Payer: Self-pay | Source: Ambulatory Visit | Attending: Medical Genetics | Admitting: Medical Genetics

## 2023-12-29 ENCOUNTER — Encounter: Payer: Self-pay | Admitting: Internal Medicine

## 2023-12-29 NOTE — Telephone Encounter (Signed)
 Called patient to confirm doing okay. Headaches are not getting worse or changing. Just persistent. Relieved with tylenol  or ibuprofen. Denies any other acute symptoms. Pt just wanted to reach out after having her eyes checked.

## 2023-12-30 NOTE — Telephone Encounter (Signed)
 Reviewed. I did check a recent sedimentation rate. This checks for inflammation and can help to determine if she could have temporal arteritis. This test was wnl. If persistent headache, needs to be evaluated.

## 2024-01-01 ENCOUNTER — Ambulatory Visit
Admission: RE | Admit: 2024-01-01 | Discharge: 2024-01-01 | Disposition: A | Discharge status: Home / Self Care | Source: Ambulatory Visit | Attending: Internal Medicine | Admitting: Internal Medicine

## 2024-01-01 DIAGNOSIS — E2839 Other primary ovarian failure: Secondary | ICD-10-CM | POA: Insufficient documentation

## 2024-01-01 DIAGNOSIS — M8589 Other specified disorders of bone density and structure, multiple sites: Secondary | ICD-10-CM | POA: Diagnosis not present

## 2024-01-01 DIAGNOSIS — Z780 Asymptomatic menopausal state: Secondary | ICD-10-CM | POA: Diagnosis not present

## 2024-01-01 DIAGNOSIS — Z1231 Encounter for screening mammogram for malignant neoplasm of breast: Secondary | ICD-10-CM | POA: Diagnosis not present

## 2024-01-02 ENCOUNTER — Ambulatory Visit: Payer: Self-pay | Admitting: Internal Medicine

## 2024-01-02 DIAGNOSIS — H31011 Macula scars of posterior pole (postinflammatory) (post-traumatic), right eye: Secondary | ICD-10-CM | POA: Diagnosis not present

## 2024-01-02 DIAGNOSIS — H43813 Vitreous degeneration, bilateral: Secondary | ICD-10-CM | POA: Diagnosis not present

## 2024-01-02 DIAGNOSIS — H2513 Age-related nuclear cataract, bilateral: Secondary | ICD-10-CM | POA: Diagnosis not present

## 2024-01-02 DIAGNOSIS — H04123 Dry eye syndrome of bilateral lacrimal glands: Secondary | ICD-10-CM | POA: Diagnosis not present

## 2024-01-04 ENCOUNTER — Other Ambulatory Visit: Payer: Self-pay | Admitting: Internal Medicine

## 2024-01-04 DIAGNOSIS — R928 Other abnormal and inconclusive findings on diagnostic imaging of breast: Secondary | ICD-10-CM

## 2024-01-07 LAB — GENECONNECT MOLECULAR SCREEN: Genetic Analysis Overall Interpretation: NEGATIVE

## 2024-01-09 ENCOUNTER — Ambulatory Visit
Admission: RE | Admit: 2024-01-09 | Discharge: 2024-01-09 | Disposition: A | Discharge status: Home / Self Care | Source: Ambulatory Visit | Attending: Internal Medicine | Admitting: Internal Medicine

## 2024-01-09 DIAGNOSIS — R921 Mammographic calcification found on diagnostic imaging of breast: Secondary | ICD-10-CM | POA: Diagnosis not present

## 2024-01-09 DIAGNOSIS — R928 Other abnormal and inconclusive findings on diagnostic imaging of breast: Secondary | ICD-10-CM | POA: Diagnosis not present

## 2024-01-09 DIAGNOSIS — R92333 Mammographic heterogeneous density, bilateral breasts: Secondary | ICD-10-CM | POA: Diagnosis not present

## 2024-01-10 ENCOUNTER — Ambulatory Visit: Payer: Self-pay | Admitting: Internal Medicine

## 2024-01-26 ENCOUNTER — Ambulatory Visit (INDEPENDENT_AMBULATORY_CARE_PROVIDER_SITE_OTHER): Admitting: Internal Medicine

## 2024-01-26 VITALS — BP 118/72 | HR 65 | Temp 98.0°F | Resp 16 | Ht 62.0 in | Wt 145.6 lb

## 2024-01-26 DIAGNOSIS — E039 Hypothyroidism, unspecified: Secondary | ICD-10-CM | POA: Diagnosis not present

## 2024-01-26 DIAGNOSIS — Z0000 Encounter for general adult medical examination without abnormal findings: Secondary | ICD-10-CM | POA: Diagnosis not present

## 2024-01-26 DIAGNOSIS — I700 Atherosclerosis of aorta: Secondary | ICD-10-CM | POA: Diagnosis not present

## 2024-01-26 DIAGNOSIS — F439 Reaction to severe stress, unspecified: Secondary | ICD-10-CM | POA: Diagnosis not present

## 2024-01-26 DIAGNOSIS — G479 Sleep disorder, unspecified: Secondary | ICD-10-CM | POA: Diagnosis not present

## 2024-01-26 DIAGNOSIS — E785 Hyperlipidemia, unspecified: Secondary | ICD-10-CM | POA: Diagnosis not present

## 2024-01-26 DIAGNOSIS — R739 Hyperglycemia, unspecified: Secondary | ICD-10-CM

## 2024-01-26 DIAGNOSIS — Z860100 Personal history of colon polyps, unspecified: Secondary | ICD-10-CM | POA: Diagnosis not present

## 2024-01-26 DIAGNOSIS — I10 Essential (primary) hypertension: Secondary | ICD-10-CM | POA: Diagnosis not present

## 2024-01-26 DIAGNOSIS — M7989 Other specified soft tissue disorders: Secondary | ICD-10-CM | POA: Insufficient documentation

## 2024-01-26 MED ORDER — ESZOPICLONE 3 MG PO TABS: ORAL_TABLET | ORAL | 2 refills | Status: DC

## 2024-01-26 NOTE — Assessment & Plan Note (Signed)
 Physical today 01/26/24. Colonoscopy 08/2020 - non bleeding external hemorrhoids.  Recommended f/u in 10 years.  Mammogram 12/29/22 - birads I. Mammogram 01/04/24 - recommended f/u views. F/u 01/09/24 - Birads II.

## 2024-01-26 NOTE — Progress Notes (Unsigned)
 Subjective:    Patient ID: [name] [name], female    DOB: [birth year], 73 y.o.   MRN: [id number]  Patient here for  Chief Complaint  Patient presents with   Annual Exam    HPI Here for a physical exam. Reports she is doing relatively well. Appears to be handling stress well. Stays active. No chest pain or sob reported. No abdominal pain or bowel change reported. Does have lipoma - back - would like checked.    Past Medical History:  Diagnosis Date   Adenomatous polyp 2005   Arrhythmia    H/O   Basal cell carcinoma    CTS (carpal tunnel syndrome)    Environmental allergies    History of chicken pox    HTN (hypertension)    Hypercholesterolemia    Hypothyroidism    Past Surgical History:  Procedure Laterality Date   BASAL CELL CARCINOMA EXCISION     right hip,abd, right arm, back neck   BREAST CYST ASPIRATION Left    negative   COLONOSCOPY WITH PROPOFOL  N/A 08/24/2020   Procedure: COLONOSCOPY WITH PROPOFOL ;  Surgeon: Selena Daily, MD;  Location: [facility];  Service: Gastroenterology;  Laterality: N/A;   CTS releast Rt wrist     ESOPHAGOGASTRODUODENOSCOPY (EGD) WITH PROPOFOL  N/A 04/22/2022   Procedure: ESOPHAGOGASTRODUODENOSCOPY (EGD) WITH PROPOFOL ;  Surgeon: Selena Daily, MD;  Location: [facility];  Service: Gastroenterology;  Laterality: N/A;   lipoma removal     Rt shoulder    MELANOMA EXCISION     right buttocks, top of scalp   POLYPECTOMY     + TA   TUBAL LIGATION  1982   Family History  Problem Relation Age of Onset   Cervical cancer Mother    Arthritis Mother    Hypertension Mother    Sudden death Mother        cerbral hemorrage   Heart disease Father    Cancer Father        lung cancer   Diabetes Maternal Grandmother    Breast cancer Paternal Aunt 52   Colon cancer Neg Hx    Social History   Socioeconomic History   Marital status: Widowed    Spouse name: Not on file   Number of children: 1   Years of education: Not on file    Highest education level: Associate degree: occupational, Scientist, product/process development, or vocational program  Occupational History   Occupation: ScheduliOfficeng/ Ortho     Employer: [employer]  Tobacco Use   Smoking status: Former    Current packs/day: 0.00    Types: Cigarettes    Quit date: 07/16/1980    Years since quitting: 43.5   Smokeless tobacco: Never  Vaping Use   Vaping status: Never Used  Substance and Sexual Activity   Alcohol use: Yes    Alcohol/week: 7.0 standard drinks of alcohol    Types: 7 Glasses of wine per week    Comment: 1 per day . NONE IN 4 WEEKS   Drug use: No   Sexual activity: Never  Other Topics Concern   Not on file  Social History Narrative   Widowed; 1 son.    Retired Sales executive   Daily caffeine use 2-3/day       Cell # [id number]   Social Drivers of Corporate investment banker Strain: Low Risk  (01/25/2024)   Overall Financial Resource Strain (CARDIA)    Difficulty of Paying Living Expenses: Not hard at all  Food Insecurity: No  Food Insecurity (01/25/2024)   Hunger Vital Sign    Worried About Running Out of Food in the Last Year: Never true    Ran Out of Food in the Last Year: Never true  Transportation Needs: No Transportation Needs (01/25/2024)   PRAPARE - Administrator, Civil Service (Medical): No    Lack of Transportation (Non-Medical): No  Physical Activity: Sufficiently Active (01/25/2024)   Exercise Vital Sign    Days of Exercise per Week: 3 days    Minutes of Exercise per Session: 60 min  Recent Concern: Physical Activity - Insufficiently Active (11/15/2023)   Exercise Vital Sign    Days of Exercise per Week: 2 days    Minutes of Exercise per Session: 50 min  Stress: No Stress Concern Present (01/25/2024)   [employer] of Occupational Health - Occupational Stress Questionnaire    Feeling of Stress: Only a little  Recent Concern: Stress - Stress Concern Present (11/15/2023)   [employer] of Occupational Health -  Occupational Stress Questionnaire    Feeling of Stress : To some extent  Social Connections: Moderately Integrated (01/25/2024)   Social Connection and Isolation Panel    Frequency of Communication with Friends and Family: More than three times a week    Frequency of Social Gatherings with Friends and Family: More than three times a week    Attends Religious Services: More than 4 times per year    Active Member of [employer] or Organizations: Yes    Attends Banker Meetings: 1 to 4 times per year    Marital Status: Widowed     Review of Systems  Constitutional:  Negative for appetite change and unexpected weight change.  HENT:  Negative for congestion, sinus pressure and sore throat.   Eyes:  Negative for pain and visual disturbance.  Respiratory:  Negative for cough, chest tightness and shortness of breath.   Cardiovascular:  Negative for chest pain, palpitations and leg swelling.  Gastrointestinal:  Negative for abdominal pain, diarrhea, nausea and vomiting.  Genitourinary:  Negative for difficulty urinating and dysuria.  Musculoskeletal:  Negative for joint swelling and myalgias.  Skin:  Negative for color change and rash.  Neurological:  Negative for dizziness and headaches.  Hematological:  Negative for adenopathy. Does not bruise/bleed easily.  Psychiatric/Behavioral:  Negative for agitation and dysphoric mood.        Objective:     BP 118/72   Pulse 65   Temp 98 F (36.7 C)   Resp 16   Ht 5' 2 (1.575 m)   Wt 145 lb 9.6 oz (66 kg)   SpO2 98%   BMI 26.63 kg/m  Wt Readings from Last 3 Encounters:  01/26/24 145 lb 9.6 oz (66 kg)  11/17/23 145 lb 6.4 oz (66 kg)  07/17/23 140 lb 12.8 oz (63.9 kg)    Physical Exam Vitals reviewed.  Constitutional:      General: She is not in acute distress.    Appearance: Normal appearance. She is well-developed.  HENT:     Head: Normocephalic and atraumatic.     Right Ear: External ear normal.     Left Ear: External  ear normal.     Mouth/Throat:     Pharynx: No oropharyngeal exudate or posterior oropharyngeal erythema.   Eyes:     General: No scleral icterus.       Right eye: No discharge.        Left eye: No discharge.  Conjunctiva/sclera: Conjunctivae normal.   Neck:     Thyroid : No thyromegaly.   Cardiovascular:     Rate and Rhythm: Normal rate and regular rhythm.  Pulmonary:     Effort: No tachypnea, accessory muscle usage or respiratory distress.     Breath sounds: Normal breath sounds. No decreased breath sounds or wheezing.  Chest:  Breasts:    Right: No inverted nipple, mass, nipple discharge or tenderness (no axillary adenopathy).     Left: No inverted nipple, mass, nipple discharge or tenderness (no axilarry adenopathy).  Abdominal:     General: Bowel sounds are normal.     Palpations: Abdomen is soft.     Tenderness: There is no abdominal tenderness.   Musculoskeletal:        General: No swelling or tenderness.     Cervical back: Neck supple.  Lymphadenopathy:     Cervical: No cervical adenopathy.   Skin:    Findings: No erythema or rash.   Neurological:     Mental Status: She is alert and oriented to person, place, and time.   Psychiatric:        Mood and Affect: Mood normal.        Behavior: Behavior normal.         Outpatient Encounter Medications as of 01/26/2024  Medication Sig   azelastine  (ASTELIN ) 0.1 % nasal spray USE 1 SPRAY IN BOTH NOSTRILS DAILY AS NEEDED   cholecalciferol (VITAMIN D ) 1000 units tablet Take 1,000 Units by mouth daily.   Cyanocobalamin  (VITAMIN B-12 PO) Take by mouth.   Eszopiclone  3 MG TABS TAKE 1/2 TABLET BY MOUTH IMMEDIATELY BEFORE BEDTIME   irbesartan  (AVAPRO ) 300 MG tablet Take 1 tablet (300 mg total) by mouth daily.   levothyroxine  (SYNTHROID ) 175 MCG tablet Take 1 tablet (175 mcg total) by mouth daily.   Nutritional Supplements (NUTRITIONAL SUPPLEMENT PO) Take by mouth. DoTERRA: ALPHA CRS+, xEO MEGA, MICROPLEX MVp, ON GUARD  (protective blend)   ondansetron  (ZOFRAN  ODT) 4 MG disintegrating tablet Take 1 tablet (4 mg total) by mouth every 8 (eight) hours as needed for nausea or vomiting.   VITAMIN D  PO Take by mouth.   Zinc 30 MG CAPS Take 30 mg by mouth daily.   [DISCONTINUED] Eszopiclone  3 MG TABS TAKE 1/2 TABLET BY MOUTH IMMEDIATELY BEFORE BEDTIME   No facility-administered encounter medications on file as of 01/26/2024.     Lab Results  Component Value Date   WBC 5.8 11/15/2023   HGB 13.6 11/15/2023   HCT 40.0 11/15/2023   PLT 255.0 11/15/2023   GLUCOSE 98 11/15/2023   CHOL 225 (H) 11/15/2023   TRIG 71.0 11/15/2023   HDL 86.40 11/15/2023   LDLDIRECT 118.2 10/16/2006   LDLCALC 125 (H) 11/15/2023   ALT 32 11/15/2023   AST 27 11/15/2023   NA 138 11/15/2023   K 4.5 11/15/2023   CL 102 11/15/2023   CREATININE 0.71 11/15/2023   BUN 21 11/15/2023   CO2 29 11/15/2023   TSH 3.03 07/10/2023   HGBA1C 5.4 11/15/2023    MM 3D DIAGNOSTIC MAMMOGRAM UNILATERAL RIGHT BREAST Result Date: 01/09/2024 CLINICAL DATA:  RIGHT breast calcification callback. EXAM: DIGITAL DIAGNOSTIC UNILATERAL RIGHT MAMMOGRAM WITH TOMOSYNTHESIS AND CAD TECHNIQUE: Right digital diagnostic mammography and breast tomosynthesis was performed. The images were evaluated with computer-aided detection. COMPARISON:  Previous exam(s). ACR Breast Density Category c: The breasts are heterogeneously dense, which may obscure small masses. FINDINGS: Spot magnification views of the RIGHT breast demonstrate a group of tram track  appearing calcifications in the upper breast at anterior depth. No suspicious mass, distortion, or microcalcifications are identified to suggest presence of malignancy. IMPRESSION: RIGHT breast calcifications are consistent with benign vascular calcifications. RECOMMENDATION: Screening mammogram in one year.(Code:SM-B-01Y) I have discussed the findings and recommendations with the patient. If applicable, a reminder letter will be sent  to the patient regarding the next appointment. BI-RADS CATEGORY  2: Benign. Electronically Signed   By: Clancy Crimes M.D.   On: 01/09/2024 15:49       Assessment & Plan:  Health care maintenance Assessment & Plan: Physical today 01/26/24. Colonoscopy 08/2020 - non bleeding external hemorrhoids.  Recommended f/u in 10 years.  Mammogram 12/29/22 - birads I. Mammogram 01/04/24 - recommended f/u views. F/u 01/09/24 - Birads II.    Soft tissue mass -     Ambulatory referral to General Surgery  Hypothyroidism, unspecified type -     TSH; Future  Stress Assessment & Plan: Has had increased stress with family medical issues.  Discussed. Overall handling things well. Does not feel needs any further intervention. Follow.    Sleeping difficulty Assessment & Plan: Taking 1/2 lunesta . Follow.    Hyperlipidemia, unspecified hyperlipidemia type Assessment & Plan: Had muscle aches with crestor . Continue low cholesterol diet and exercise. Follow lipid panel.  Lab Results  Component Value Date   CHOL 225 (H) 11/15/2023   HDL 86.40 11/15/2023   LDLCALC 125 (H) 11/15/2023   LDLDIRECT 118.2 10/16/2006   TRIG 71.0 11/15/2023   CHOLHDL 3 11/15/2023      Hyperglycemia Assessment & Plan: Low carb diet and exercise. Follow met b and A1c.   Lab Results  Component Value Date   HGBA1C 5.4 11/15/2023      History of colonic polyps Assessment & Plan: Colonoscopy 08/24/20 - recommended f/u in 10 years.    Essential hypertension Assessment & Plan: Continue avapro  - on 300mg  daily. Blood pressure as outlined. Follow pressures. Follow metabolic panel.    Aortic atherosclerosis (HCC) Assessment & Plan: Intolerance to crestor . Continue blood pressure control.    Other orders -     Eszopiclone ; TAKE 1/2 TABLET BY MOUTH IMMEDIATELY BEFORE BEDTIME  Dispense: 30 tablet; Refill: 2     Dellar Fenton, MD

## 2024-01-27 ENCOUNTER — Encounter: Payer: Self-pay | Admitting: Internal Medicine

## 2024-01-27 NOTE — Assessment & Plan Note (Signed)
Colonoscopy 08/24/20 - recommended f/u in 10 years.

## 2024-01-27 NOTE — Assessment & Plan Note (Signed)
 Intolerance to crestor . Continue blood pressure control.

## 2024-01-27 NOTE — Assessment & Plan Note (Signed)
Taking 1/2 lunesta.  Follow.

## 2024-01-27 NOTE — Assessment & Plan Note (Signed)
 Had muscle aches with crestor . Continue low cholesterol diet and exercise. Follow lipid panel.  Lab Results  Component Value Date   CHOL 225 (H) 11/15/2023   HDL 86.40 11/15/2023   LDLCALC 125 (H) 11/15/2023   LDLDIRECT 118.2 10/16/2006   TRIG 71.0 11/15/2023   CHOLHDL 3 11/15/2023

## 2024-01-27 NOTE — Assessment & Plan Note (Signed)
 Has had increased stress with family medical issues.  Discussed. Overall handling things well. Does not feel needs any further intervention. Follow.

## 2024-01-27 NOTE — Assessment & Plan Note (Signed)
 Continue avapro  - on 300mg  daily. Blood pressure as outlined. Follow pressures. Follow metabolic panel.

## 2024-01-27 NOTE — Assessment & Plan Note (Signed)
 Low carb diet and exercise. Follow met b and A1c.   Lab Results  Component Value Date   HGBA1C 5.4 11/15/2023

## 2024-01-31 DIAGNOSIS — M9903 Segmental and somatic dysfunction of lumbar region: Secondary | ICD-10-CM | POA: Diagnosis not present

## 2024-01-31 DIAGNOSIS — M9904 Segmental and somatic dysfunction of sacral region: Secondary | ICD-10-CM | POA: Diagnosis not present

## 2024-01-31 DIAGNOSIS — M5416 Radiculopathy, lumbar region: Secondary | ICD-10-CM | POA: Diagnosis not present

## 2024-01-31 DIAGNOSIS — M62830 Muscle spasm of back: Secondary | ICD-10-CM | POA: Diagnosis not present

## 2024-02-06 ENCOUNTER — Encounter: Payer: Self-pay | Admitting: Internal Medicine

## 2024-02-06 DIAGNOSIS — D171 Benign lipomatous neoplasm of skin and subcutaneous tissue of trunk: Secondary | ICD-10-CM | POA: Diagnosis not present

## 2024-02-14 ENCOUNTER — Other Ambulatory Visit: Payer: Self-pay | Admitting: Internal Medicine

## 2024-02-21 ENCOUNTER — Other Ambulatory Visit (INDEPENDENT_AMBULATORY_CARE_PROVIDER_SITE_OTHER)

## 2024-02-21 ENCOUNTER — Ambulatory Visit: Payer: Self-pay | Admitting: Internal Medicine

## 2024-02-21 DIAGNOSIS — E039 Hypothyroidism, unspecified: Secondary | ICD-10-CM | POA: Diagnosis not present

## 2024-02-21 LAB — TSH: TSH: 3.34 u[IU]/mL (ref 0.35–5.50)

## 2024-02-23 ENCOUNTER — Other Ambulatory Visit

## 2024-02-26 DIAGNOSIS — M9903 Segmental and somatic dysfunction of lumbar region: Secondary | ICD-10-CM | POA: Diagnosis not present

## 2024-02-26 DIAGNOSIS — M9904 Segmental and somatic dysfunction of sacral region: Secondary | ICD-10-CM | POA: Diagnosis not present

## 2024-02-26 DIAGNOSIS — M5416 Radiculopathy, lumbar region: Secondary | ICD-10-CM | POA: Diagnosis not present

## 2024-02-26 DIAGNOSIS — M62830 Muscle spasm of back: Secondary | ICD-10-CM | POA: Diagnosis not present

## 2024-03-25 DIAGNOSIS — M9904 Segmental and somatic dysfunction of sacral region: Secondary | ICD-10-CM | POA: Diagnosis not present

## 2024-03-25 DIAGNOSIS — M62830 Muscle spasm of back: Secondary | ICD-10-CM | POA: Diagnosis not present

## 2024-03-25 DIAGNOSIS — M9903 Segmental and somatic dysfunction of lumbar region: Secondary | ICD-10-CM | POA: Diagnosis not present

## 2024-03-25 DIAGNOSIS — M5416 Radiculopathy, lumbar region: Secondary | ICD-10-CM | POA: Diagnosis not present

## 2024-03-26 ENCOUNTER — Other Ambulatory Visit: Payer: Self-pay | Admitting: Internal Medicine

## 2024-04-17 ENCOUNTER — Ambulatory Visit: Payer: PPO

## 2024-04-22 DIAGNOSIS — M9903 Segmental and somatic dysfunction of lumbar region: Secondary | ICD-10-CM | POA: Diagnosis not present

## 2024-04-22 DIAGNOSIS — M9904 Segmental and somatic dysfunction of sacral region: Secondary | ICD-10-CM | POA: Diagnosis not present

## 2024-04-22 DIAGNOSIS — M5416 Radiculopathy, lumbar region: Secondary | ICD-10-CM | POA: Diagnosis not present

## 2024-04-22 DIAGNOSIS — M62830 Muscle spasm of back: Secondary | ICD-10-CM | POA: Diagnosis not present

## 2024-04-29 ENCOUNTER — Other Ambulatory Visit: Payer: Self-pay | Admitting: Internal Medicine

## 2024-05-16 ENCOUNTER — Other Ambulatory Visit: Payer: Self-pay | Admitting: Internal Medicine

## 2024-05-26 ENCOUNTER — Encounter: Payer: Self-pay | Admitting: Internal Medicine

## 2024-05-27 ENCOUNTER — Other Ambulatory Visit (INDEPENDENT_AMBULATORY_CARE_PROVIDER_SITE_OTHER)

## 2024-05-27 DIAGNOSIS — E785 Hyperlipidemia, unspecified: Secondary | ICD-10-CM | POA: Diagnosis not present

## 2024-05-27 DIAGNOSIS — R739 Hyperglycemia, unspecified: Secondary | ICD-10-CM

## 2024-05-27 DIAGNOSIS — I10 Essential (primary) hypertension: Secondary | ICD-10-CM | POA: Diagnosis not present

## 2024-05-27 LAB — HEMOGLOBIN A1C: Hgb A1c MFr Bld: 5.6 % (ref 4.6–6.5)

## 2024-05-27 LAB — BASIC METABOLIC PANEL WITH GFR
BUN: 15 mg/dL (ref 6–23)
CO2: 30 meq/L (ref 19–32)
Calcium: 9 mg/dL (ref 8.4–10.5)
Chloride: 99 meq/L (ref 96–112)
Creatinine, Ser: 0.7 mg/dL (ref 0.40–1.20)
GFR: 84.88 mL/min (ref >60.00)
Glucose, Bld: 96 mg/dL (ref 70–99)
Potassium: 4.5 meq/L (ref 3.5–5.1)
Sodium: 137 meq/L (ref 135–145)

## 2024-05-27 LAB — LIPID PANEL
Cholesterol: 194 mg/dL (ref 0–200)
HDL: 72.1 mg/dL (ref >39.00)
LDL Cholesterol: 101 mg/dL — ABNORMAL HIGH (ref 0–99)
NonHDL: 121.56
Total CHOL/HDL Ratio: 3
Triglycerides: 102 mg/dL (ref 0.0–149.0)
VLDL: 20.4 mg/dL (ref 0.0–40.0)

## 2024-05-27 LAB — HEPATIC FUNCTION PANEL
ALT: 69 U/L — ABNORMAL HIGH (ref 0–35)
AST: 25 U/L (ref 0–37)
Albumin: 4.2 g/dL (ref 3.5–5.2)
Alkaline Phosphatase: 171 U/L — ABNORMAL HIGH (ref 39–117)
Bilirubin, Direct: 0.1 mg/dL (ref 0.0–0.3)
Total Bilirubin: 0.7 mg/dL (ref 0.2–1.2)
Total Protein: 6.1 g/dL (ref 6.0–8.3)

## 2024-05-27 NOTE — Telephone Encounter (Signed)
 Please call and confirm she is doing ok. With meeting, etc tomorrow - I am not sure that I can work her in before Wednesday. Please hold out and I will see what I can do.

## 2024-05-27 NOTE — Telephone Encounter (Signed)
 Patient says she is feeling some better today. Better than yesterday when she sent message. She is going to keep her appointment for Wednesday/

## 2024-05-29 ENCOUNTER — Ambulatory Visit (INDEPENDENT_AMBULATORY_CARE_PROVIDER_SITE_OTHER): Admitting: Internal Medicine

## 2024-05-29 ENCOUNTER — Encounter: Payer: Self-pay | Admitting: Internal Medicine

## 2024-05-29 VITALS — BP 130/70 | HR 74 | Resp 16 | Ht 62.0 in | Wt 151.0 lb

## 2024-05-29 DIAGNOSIS — Z860100 Personal history of colon polyps, unspecified: Secondary | ICD-10-CM

## 2024-05-29 DIAGNOSIS — R739 Hyperglycemia, unspecified: Secondary | ICD-10-CM | POA: Diagnosis not present

## 2024-05-29 DIAGNOSIS — I700 Atherosclerosis of aorta: Secondary | ICD-10-CM

## 2024-05-29 DIAGNOSIS — E039 Hypothyroidism, unspecified: Secondary | ICD-10-CM

## 2024-05-29 DIAGNOSIS — I10 Essential (primary) hypertension: Secondary | ICD-10-CM | POA: Diagnosis not present

## 2024-05-29 DIAGNOSIS — R051 Acute cough: Secondary | ICD-10-CM

## 2024-05-29 DIAGNOSIS — F439 Reaction to severe stress, unspecified: Secondary | ICD-10-CM | POA: Diagnosis not present

## 2024-05-29 DIAGNOSIS — E785 Hyperlipidemia, unspecified: Secondary | ICD-10-CM

## 2024-05-29 MED ORDER — PREDNISONE 10 MG PO TABS: ORAL_TABLET | ORAL | 0 refills | Status: AC

## 2024-05-29 NOTE — Patient Instructions (Signed)
 Afrin nasal spray - 2 sprays each nostril 2x/day for three days only.

## 2024-05-29 NOTE — Progress Notes (Signed)
 Subjective:    Patient ID: [name] [name], female    DOB: 29-Jan-1951, 73 y.o.   MRN: [id number]  Patient here for  Chief Complaint  Patient presents with   Medical Management of Chronic Issues    HPI Here today for a scheduled follow up - follow up regarding hypertension and hypercholesterolemia. Saw Dr Cesar 02/06/24 - recommended excision of lipoma. Recently traveled to [place] and [place] - returned home 05/15/24. Travel companion diagnosed with covid 05/16/24.  She developed cough and congestion - worsening symptoms - evaluated walkin clinic. Given prednisone  and doxycycline . Had reaction to doxycycline  and abx changed to zpak. Completed zpak 05/28/24. Persistent cough and ears stopped up. Had body aches. Also taking mucinex and delsym. She is sleeping better. Some sinus pressure. No sore throat. No significant drainage. Productive cough. No vomiting or diarrhea.    Past Medical History:  Diagnosis Date   Adenomatous polyp 2005   Arrhythmia    H/O   Basal cell carcinoma    CTS (carpal tunnel syndrome)    Environmental allergies    History of chicken pox    HTN (hypertension)    Hypercholesterolemia    Hypothyroidism    Past Surgical History:  Procedure Laterality Date   BASAL CELL CARCINOMA EXCISION     right hip,abd, right arm, back neck   BREAST CYST ASPIRATION Left    negative   COLONOSCOPY WITH PROPOFOL  N/A 08/24/2020   Procedure: COLONOSCOPY WITH PROPOFOL ;  Surgeon: Unk Corinn Skiff, MD;  Location: [facility];  Service: Gastroenterology;  Laterality: N/A;   CTS releast Rt wrist     ESOPHAGOGASTRODUODENOSCOPY (EGD) WITH PROPOFOL  N/A 04/22/2022   Procedure: ESOPHAGOGASTRODUODENOSCOPY (EGD) WITH PROPOFOL ;  Surgeon: Unk Corinn Skiff, MD;  Location: [facility];  Service: Gastroenterology;  Laterality: N/A;   lipoma removal     Rt shoulder    MELANOMA EXCISION     right buttocks, top of scalp   POLYPECTOMY     + TA   TUBAL LIGATION  1982   Family History   Problem Relation Age of Onset   Cervical cancer Mother    Arthritis Mother    Hypertension Mother    Sudden death Mother        cerbral hemorrage   Heart disease Father    Cancer Father        lung cancer   Diabetes Maternal Grandmother    Breast cancer Paternal Aunt 31   Colon cancer Neg Hx    Social History   Socioeconomic History   Marital status: Widowed    Spouse name: Not on file   Number of children: 1   Years of education: Not on file   Highest education level: Associate degree: occupational, Scientist, product/process development, or vocational program  Occupational History   Occupation: ScheduliOfficeng/ Ortho     Employer: [employer]  Tobacco Use   Smoking status: Former    Current packs/day: 0.00    Types: Cigarettes    Quit date: 07/16/1980    Years since quitting: 43.9   Smokeless tobacco: Never  Vaping Use   Vaping status: Never Used  Substance and Sexual Activity   Alcohol use: Yes    Alcohol/week: 7.0 standard drinks of alcohol    Types: 7 Glasses of wine per week    Comment: 1 per day . NONE IN 4 WEEKS   Drug use: No   Sexual activity: Never  Other Topics Concern   Not on file  Social History Narrative  Widowed; 1 son.    Retired Sales executive   Daily caffeine use 2-3/day       Cell # [id number]   Social Drivers of Corporate investment banker Strain: Low Risk  (05/26/2024)   Overall Financial Resource Strain (CARDIA)    Difficulty of Paying Living Expenses: Not hard at all  Food Insecurity: No Food Insecurity (05/26/2024)   Hunger Vital Sign    Worried About Running Out of Food in the Last Year: Never true    Ran Out of Food in the Last Year: Never true  Transportation Needs: No Transportation Needs (05/26/2024)   PRAPARE - Administrator, Civil Service (Medical): No    Lack of Transportation (Non-Medical): No  Physical Activity: Insufficiently Active (05/26/2024)   Exercise Vital Sign    Days of Exercise per Week: 3 days    Minutes of Exercise  per Session: 40 min  Stress: No Stress Concern Present (05/26/2024)   [employer] of Occupational Health - Occupational Stress Questionnaire    Feeling of Stress: Not at all  Social Connections: Moderately Integrated (05/26/2024)   Social Connection and Isolation Panel    Frequency of Communication with Friends and Family: More than three times a week    Frequency of Social Gatherings with Friends and Family: Three times a week    Attends Religious Services: More than 4 times per year    Active Member of Clubs or Organizations: Yes    Attends Banker Meetings: 1 to 4 times per year    Marital Status: Widowed     Review of Systems  Constitutional:  Negative for appetite change, fever and unexpected weight change.  HENT:  Positive for congestion and sinus pressure.   Respiratory:  Positive for cough. Negative for chest tightness and shortness of breath.   Cardiovascular:  Negative for chest pain, palpitations and leg swelling.  Gastrointestinal:  Negative for abdominal pain, diarrhea, nausea and vomiting.  Genitourinary:  Negative for difficulty urinating and dysuria.  Musculoskeletal:  Negative for joint swelling and myalgias.  Skin:  Negative for color change and rash.  Neurological:  Negative for dizziness and headaches.  Psychiatric/Behavioral:  Negative for agitation and dysphoric mood.        Objective:     BP 130/70   Pulse 74   Resp 16   Ht 5' 2 (1.575 m)   Wt 151 lb (68.5 kg)   SpO2 98%   BMI 27.62 kg/m  Wt Readings from Last 3 Encounters:  05/29/24 151 lb (68.5 kg)  01/26/24 145 lb 9.6 oz (66 kg)  11/17/23 145 lb 6.4 oz (66 kg)    Physical Exam Vitals reviewed.  Constitutional:      General: She is not in acute distress.    Appearance: Normal appearance.  HENT:     Head: Normocephalic and atraumatic.     Right Ear: External ear normal.     Left Ear: External ear normal.  Eyes:     General: No scleral icterus.       Right eye: No  discharge.        Left eye: No discharge.     Conjunctiva/sclera: Conjunctivae normal.  Neck:     Thyroid : No thyromegaly.  Cardiovascular:     Rate and Rhythm: Normal rate and regular rhythm.  Pulmonary:     Effort: No respiratory distress.     Breath sounds: Normal breath sounds. No wheezing.  Abdominal:     General:  Bowel sounds are normal.     Palpations: Abdomen is soft.     Tenderness: There is no abdominal tenderness.  Musculoskeletal:        General: No swelling or tenderness.     Cervical back: Neck supple. No tenderness.  Lymphadenopathy:     Cervical: No cervical adenopathy.  Skin:    Findings: No erythema or rash.  Neurological:     Mental Status: She is alert.  Psychiatric:        Mood and Affect: Mood normal.        Behavior: Behavior normal.         Outpatient Encounter Medications as of 05/29/2024  Medication Sig   predniSONE  (DELTASONE ) 10 MG tablet Take 6 tablets x 1 day and then decrease by 1/2 tablet per day until down to zero mg.   azelastine  (ASTELIN ) 0.1 % nasal spray USE 1 SPRAY IN BOTH NOSTRILS DAILY AS NEEDED   cholecalciferol (VITAMIN D ) 1000 units tablet Take 1,000 Units by mouth daily.   Cyanocobalamin  (VITAMIN B-12 PO) Take by mouth.   Eszopiclone  3 MG TABS TAKE 1/2 TABLET BY MOUTH IMMEDIATELY BEFORE BEDTIME   irbesartan  (AVAPRO ) 300 MG tablet TAKE 1 TABLET BY MOUTH DAILY   levothyroxine  (SYNTHROID ) 175 MCG tablet TAKE 1 TABLET BY MOUTH ONCE DAILY   Nutritional Supplements (NUTRITIONAL SUPPLEMENT PO) Take by mouth. DoTERRA: ALPHA CRS+, xEO MEGA, MICROPLEX MVp, ON GUARD (protective blend)   ondansetron  (ZOFRAN  ODT) 4 MG disintegrating tablet Take 1 tablet (4 mg total) by mouth every 8 (eight) hours as needed for nausea or vomiting.   VITAMIN D  PO Take by mouth.   Zinc 30 MG CAPS Take 30 mg by mouth daily.   No facility-administered encounter medications on file as of 05/29/2024.     Lab Results  Component Value Date   WBC 5.8 11/15/2023    HGB 13.6 11/15/2023   HCT 40.0 11/15/2023   PLT 255.0 11/15/2023   GLUCOSE 96 05/27/2024   CHOL 194 05/27/2024   TRIG 102.0 05/27/2024   HDL 72.10 05/27/2024   LDLDIRECT 118.2 10/16/2006   LDLCALC 101 (H) 05/27/2024   ALT 69 (H) 05/27/2024   AST 25 05/27/2024   NA 137 05/27/2024   K 4.5 05/27/2024   CL 99 05/27/2024   CREATININE 0.70 05/27/2024   BUN 15 05/27/2024   CO2 30 05/27/2024   TSH 3.34 02/21/2024   HGBA1C 5.6 05/27/2024    MM 3D DIAGNOSTIC MAMMOGRAM UNILATERAL RIGHT BREAST Result Date: 01/09/2024 CLINICAL DATA:  RIGHT breast calcification callback. EXAM: DIGITAL DIAGNOSTIC UNILATERAL RIGHT MAMMOGRAM WITH TOMOSYNTHESIS AND CAD TECHNIQUE: Right digital diagnostic mammography and breast tomosynthesis was performed. The images were evaluated with computer-aided detection. COMPARISON:  Previous exam(s). ACR Breast Density Category c: The breasts are heterogeneously dense, which may obscure small masses. FINDINGS: Spot magnification views of the RIGHT breast demonstrate a group of tram track appearing calcifications in the upper breast at anterior depth. No suspicious mass, distortion, or microcalcifications are identified to suggest presence of malignancy. IMPRESSION: RIGHT breast calcifications are consistent with benign vascular calcifications. RECOMMENDATION: Screening mammogram in one year.(Code:SM-B-01Y) I have discussed the findings and recommendations with the patient. If applicable, a reminder letter will be sent to the patient regarding the next appointment. BI-RADS CATEGORY  2: Benign. Electronically Signed   By: Corean Salter M.D.   On: 01/09/2024 15:49       Assessment & Plan:  Hyperlipidemia, unspecified hyperlipidemia type Assessment & Plan: The 10-year ASCVD risk  score (Arnett DK, et al., 2019) is: 19.4%   Values used to calculate the score:     Age: 51 years     Clincally relevant sex: Female     Is Non-Hispanic African American: No     Diabetic: No      Tobacco smoker: No     Systolic Blood Pressure: 130 mmHg     Is BP treated: Yes     HDL Cholesterol: 72.1 mg/dL     Total Cholesterol: 194 mg/dL  Intolerance to crestor . Low cholesterol diet and exercise. Follow lipid panel.    Aortic atherosclerosis Assessment & Plan: Intolerance to crestor . Continue blood pressure control.    Essential hypertension Assessment & Plan: Continue avapro  - on 300mg  daily. Blood pressure as outlined. Follow pressures. Follow metabolic panel.    History of colonic polyps Assessment & Plan: Colonoscopy 08/24/20 - recommended f/u in 10 years.    Hyperglycemia Assessment & Plan: Low carb diet and exercise. Follow met b and A1c.   Lab Results  Component Value Date   HGBA1C 5.6 05/27/2024      Hypothyroidism, unspecified type Assessment & Plan: On thyroid  replacement. Follow tsh.    Stress Assessment & Plan: Increased stress. Discussed. Overall appears to be handling things relatively well. Follow.    Acute cough Assessment & Plan: Cough and congestion as outlined. Just completed zpak. Continue nasacort, mucinex and delsym prn. Afrin nasal spray for three days. Prednisone  taper as directed. Follow.  Call with update.    Other orders -     predniSONE ; Take 6 tablets x 1 day and then decrease by 1/2 tablet per day until down to zero mg.  Dispense: 39 tablet; Refill: 0     Allena Hamilton, MD

## 2024-05-29 NOTE — Assessment & Plan Note (Addendum)
 The 10-year ASCVD risk score (Arnett DK, et al., 2019) is: 19.4%   Values used to calculate the score:     Age: 73 years     Clincally relevant sex: Female     Is Non-Hispanic African American: No     Diabetic: No     Tobacco smoker: No     Systolic Blood Pressure: 130 mmHg     Is BP treated: Yes     HDL Cholesterol: 72.1 mg/dL     Total Cholesterol: 194 mg/dL  Intolerance to crestor . Low cholesterol diet and exercise. Follow lipid panel.

## 2024-05-31 ENCOUNTER — Other Ambulatory Visit: Payer: Self-pay | Admitting: *Deleted

## 2024-05-31 ENCOUNTER — Encounter: Payer: Self-pay | Admitting: Internal Medicine

## 2024-05-31 DIAGNOSIS — E785 Hyperlipidemia, unspecified: Secondary | ICD-10-CM

## 2024-06-04 ENCOUNTER — Encounter: Payer: Self-pay | Admitting: Internal Medicine

## 2024-06-04 NOTE — Assessment & Plan Note (Signed)
 Cough and congestion as outlined. Just completed zpak. Continue nasacort, mucinex and delsym prn. Afrin nasal spray for three days. Prednisone  taper as directed. Follow.  Call with update.

## 2024-06-04 NOTE — Assessment & Plan Note (Signed)
 On thyroid replacement.  Follow tsh.

## 2024-06-04 NOTE — Assessment & Plan Note (Signed)
 Continue avapro  - on 300mg  daily. Blood pressure as outlined. Follow pressures. Follow metabolic panel.

## 2024-06-04 NOTE — Assessment & Plan Note (Signed)
 Low carb diet and exercise. Follow met b and A1c.   Lab Results  Component Value Date   HGBA1C 5.6 05/27/2024

## 2024-06-04 NOTE — Assessment & Plan Note (Signed)
Increased stress.  Discussed.  Overall appears to be handling things relatively well.  Follow.  

## 2024-06-04 NOTE — Assessment & Plan Note (Signed)
 Intolerance to crestor . Continue blood pressure control.

## 2024-06-04 NOTE — Assessment & Plan Note (Signed)
Colonoscopy 08/24/20 - recommended f/u in 10 years.

## 2024-06-05 DIAGNOSIS — C44319 Basal cell carcinoma of skin of other parts of face: Secondary | ICD-10-CM | POA: Diagnosis not present

## 2024-06-05 DIAGNOSIS — Z85828 Personal history of other malignant neoplasm of skin: Secondary | ICD-10-CM | POA: Diagnosis not present

## 2024-06-05 DIAGNOSIS — Z872 Personal history of diseases of the skin and subcutaneous tissue: Secondary | ICD-10-CM | POA: Diagnosis not present

## 2024-06-05 DIAGNOSIS — Z85820 Personal history of malignant melanoma of skin: Secondary | ICD-10-CM | POA: Diagnosis not present

## 2024-06-05 DIAGNOSIS — Z859 Personal history of malignant neoplasm, unspecified: Secondary | ICD-10-CM | POA: Diagnosis not present

## 2024-06-05 DIAGNOSIS — D485 Neoplasm of uncertain behavior of skin: Secondary | ICD-10-CM | POA: Diagnosis not present

## 2024-06-05 DIAGNOSIS — L638 Other alopecia areata: Secondary | ICD-10-CM | POA: Diagnosis not present

## 2024-06-05 DIAGNOSIS — L578 Other skin changes due to chronic exposure to nonionizing radiation: Secondary | ICD-10-CM | POA: Diagnosis not present

## 2024-06-05 DIAGNOSIS — Z86018 Personal history of other benign neoplasm: Secondary | ICD-10-CM | POA: Diagnosis not present

## 2024-06-10 ENCOUNTER — Other Ambulatory Visit

## 2024-06-10 DIAGNOSIS — E785 Hyperlipidemia, unspecified: Secondary | ICD-10-CM | POA: Diagnosis not present

## 2024-06-10 LAB — HEPATIC FUNCTION PANEL
ALT: 25 U/L (ref 0–35)
AST: 14 U/L (ref 0–37)
Albumin: 4.2 g/dL (ref 3.5–5.2)
Alkaline Phosphatase: 116 U/L (ref 39–117)
Bilirubin, Direct: 0.1 mg/dL (ref 0.0–0.3)
Total Bilirubin: 0.6 mg/dL (ref 0.2–1.2)
Total Protein: 6.2 g/dL (ref 6.0–8.3)

## 2024-06-11 ENCOUNTER — Ambulatory Visit: Payer: Self-pay | Admitting: Internal Medicine

## 2024-07-13 ENCOUNTER — Other Ambulatory Visit: Payer: Self-pay | Admitting: Internal Medicine

## 2024-07-15 ENCOUNTER — Encounter: Payer: Self-pay | Admitting: Internal Medicine

## 2024-07-16 NOTE — Telephone Encounter (Signed)
 Rx ok'd for lunesta . PDMP reviewed.

## 2024-09-30 ENCOUNTER — Other Ambulatory Visit

## 2024-10-02 ENCOUNTER — Ambulatory Visit: Admitting: Internal Medicine
